# Patient Record
Sex: Male | Born: 1950 | Race: White | Hispanic: No | Marital: Married | Smoking: Never smoker
Health system: Southern US, Community
[De-identification: ages and names within clinical notes are randomized; demographics above are authoritative.]

## PROBLEM LIST (undated history)

## (undated) DIAGNOSIS — I82409 Acute embolism and thrombosis of unspecified deep veins of unspecified lower extremity: Secondary | ICD-10-CM

## (undated) DIAGNOSIS — C711 Malignant neoplasm of frontal lobe: Secondary | ICD-10-CM

## (undated) HISTORY — PX: BRAIN SURGERY: SHX531

## (undated) HISTORY — DX: Malignant neoplasm of frontal lobe: C71.1

## (undated) HISTORY — DX: Acute embolism and thrombosis of unspecified deep veins of unspecified lower extremity: I82.409

## (undated) HISTORY — PX: VASECTOMY: SHX75

---

## 2010-05-20 ENCOUNTER — Inpatient Hospital Stay (HOSPITAL_COMMUNITY): Admission: EM | Admit: 2010-05-20 | Discharge: 2010-05-22 | Payer: Self-pay | Admitting: Neurosurgery

## 2010-05-28 ENCOUNTER — Encounter (INDEPENDENT_AMBULATORY_CARE_PROVIDER_SITE_OTHER): Payer: Self-pay | Admitting: Neurosurgery

## 2010-05-28 ENCOUNTER — Inpatient Hospital Stay (HOSPITAL_COMMUNITY): Admission: RE | Admit: 2010-05-28 | Discharge: 2010-05-30 | Payer: Self-pay | Admitting: Neurosurgery

## 2010-05-30 ENCOUNTER — Ambulatory Visit: Payer: Self-pay | Admitting: Oncology

## 2010-06-01 ENCOUNTER — Ambulatory Visit
Admission: RE | Admit: 2010-06-01 | Discharge: 2010-07-23 | Payer: Self-pay | Source: Home / Self Care | Attending: Radiation Oncology | Admitting: Radiation Oncology

## 2010-06-04 LAB — CBC WITH DIFFERENTIAL/PLATELET
BASO%: 0.1 % (ref 0.0–2.0)
Basophils Absolute: 0 10*3/uL (ref 0.0–0.1)
EOS%: 0.1 % (ref 0.0–7.0)
Eosinophils Absolute: 0 10*3/uL (ref 0.0–0.5)
HCT: 44.3 % (ref 38.4–49.9)
HGB: 15.4 g/dL (ref 13.0–17.1)
LYMPH%: 8.5 % — ABNORMAL LOW (ref 14.0–49.0)
MCH: 32 pg (ref 27.2–33.4)
MCHC: 34.8 g/dL (ref 32.0–36.0)
MCV: 92.1 fL (ref 79.3–98.0)
MONO#: 0.7 10*3/uL (ref 0.1–0.9)
MONO%: 5.2 % (ref 0.0–14.0)
NEUT#: 12.1 10*3/uL — ABNORMAL HIGH (ref 1.5–6.5)
NEUT%: 86.1 % — ABNORMAL HIGH (ref 39.0–75.0)
Platelets: 212 10*3/uL (ref 140–400)
RBC: 4.81 10*6/uL (ref 4.20–5.82)
RDW: 12.4 % (ref 11.0–14.6)
WBC: 14.1 10*3/uL — ABNORMAL HIGH (ref 4.0–10.3)
lymph#: 1.2 10*3/uL (ref 0.9–3.3)

## 2010-06-04 LAB — COMPREHENSIVE METABOLIC PANEL
ALT: 75 U/L — ABNORMAL HIGH (ref 0–53)
AST: 45 U/L — ABNORMAL HIGH (ref 0–37)
Albumin: 3.9 g/dL (ref 3.5–5.2)
Alkaline Phosphatase: 91 U/L (ref 39–117)
BUN: 22 mg/dL (ref 6–23)
CO2: 31 mEq/L (ref 19–32)
Calcium: 8.9 mg/dL (ref 8.4–10.5)
Chloride: 98 mEq/L (ref 96–112)
Creatinine, Ser: 1.15 mg/dL (ref 0.40–1.50)
Glucose, Bld: 90 mg/dL (ref 70–99)
Potassium: 4.8 mEq/L (ref 3.5–5.3)
Sodium: 137 mEq/L (ref 135–145)
Total Bilirubin: 0.4 mg/dL (ref 0.3–1.2)
Total Protein: 6.2 g/dL (ref 6.0–8.3)

## 2010-07-02 ENCOUNTER — Ambulatory Visit: Payer: Self-pay | Admitting: Oncology

## 2010-07-04 LAB — CBC WITH DIFFERENTIAL/PLATELET
Basophils Absolute: 0 10*3/uL (ref 0.0–0.1)
EOS%: 0.1 % (ref 0.0–7.0)
HCT: 43.4 % (ref 38.4–49.9)
HGB: 15 g/dL (ref 13.0–17.1)
MCH: 32.5 pg (ref 27.2–33.4)
NEUT%: 86.1 % — ABNORMAL HIGH (ref 39.0–75.0)
Platelets: 260 10*3/uL (ref 140–400)
lymph#: 0.8 10*3/uL — ABNORMAL LOW (ref 0.9–3.3)

## 2010-07-04 LAB — COMPREHENSIVE METABOLIC PANEL
AST: 28 U/L (ref 0–37)
BUN: 21 mg/dL (ref 6–23)
CO2: 28 mEq/L (ref 19–32)
Calcium: 9.3 mg/dL (ref 8.4–10.5)
Chloride: 103 mEq/L (ref 96–112)
Creatinine, Ser: 1.12 mg/dL (ref 0.40–1.50)

## 2010-08-08 ENCOUNTER — Ambulatory Visit: Payer: Self-pay | Admitting: Oncology

## 2010-08-09 LAB — CBC WITH DIFFERENTIAL/PLATELET
BASO%: 0.5 % (ref 0.0–2.0)
Basophils Absolute: 0 10*3/uL (ref 0.0–0.1)
EOS%: 0.9 % (ref 0.0–7.0)
Eosinophils Absolute: 0.1 10*3/uL (ref 0.0–0.5)
HCT: 43.5 % (ref 38.4–49.9)
HGB: 14.8 g/dL (ref 13.0–17.1)
LYMPH%: 10.3 % — ABNORMAL LOW (ref 14.0–49.0)
MCH: 32.3 pg (ref 27.2–33.4)
MCHC: 34 g/dL (ref 32.0–36.0)
MCV: 95.1 fL (ref 79.3–98.0)
MONO#: 0.4 10*3/uL (ref 0.1–0.9)
MONO%: 6.6 % (ref 0.0–14.0)
NEUT#: 4.6 10*3/uL (ref 1.5–6.5)
NEUT%: 81.7 % — ABNORMAL HIGH (ref 39.0–75.0)
Platelets: 206 10*3/uL (ref 140–400)
RBC: 4.58 10*6/uL (ref 4.20–5.82)
RDW: 14.2 % (ref 11.0–14.6)
WBC: 5.6 10*3/uL (ref 4.0–10.3)
lymph#: 0.6 10*3/uL — ABNORMAL LOW (ref 0.9–3.3)

## 2010-08-09 LAB — COMPREHENSIVE METABOLIC PANEL
ALT: 68 U/L — ABNORMAL HIGH (ref 0–53)
AST: 36 U/L (ref 0–37)
Albumin: 4.5 g/dL (ref 3.5–5.2)
Alkaline Phosphatase: 123 U/L — ABNORMAL HIGH (ref 39–117)
BUN: 13 mg/dL (ref 6–23)
CO2: 27 mEq/L (ref 19–32)
Calcium: 9.6 mg/dL (ref 8.4–10.5)
Chloride: 104 mEq/L (ref 96–112)
Creatinine, Ser: 1.05 mg/dL (ref 0.40–1.50)
Glucose, Bld: 103 mg/dL — ABNORMAL HIGH (ref 70–99)
Potassium: 5 mEq/L (ref 3.5–5.3)
Sodium: 142 mEq/L (ref 135–145)
Total Bilirubin: 0.6 mg/dL (ref 0.3–1.2)
Total Protein: 7.1 g/dL (ref 6.0–8.3)

## 2010-08-20 ENCOUNTER — Ambulatory Visit (HOSPITAL_COMMUNITY)
Admission: RE | Admit: 2010-08-20 | Discharge: 2010-08-20 | Payer: Self-pay | Source: Home / Self Care | Attending: Oncology | Admitting: Oncology

## 2010-08-29 LAB — COMPREHENSIVE METABOLIC PANEL
ALT: 25 U/L (ref 0–53)
AST: 23 U/L (ref 0–37)
Albumin: 4.2 g/dL (ref 3.5–5.2)
Alkaline Phosphatase: 87 U/L (ref 39–117)
BUN: 13 mg/dL (ref 6–23)
CO2: 28 mEq/L (ref 19–32)
Calcium: 9.3 mg/dL (ref 8.4–10.5)
Chloride: 103 mEq/L (ref 96–112)
Creatinine, Ser: 1.08 mg/dL (ref 0.40–1.50)
Glucose, Bld: 94 mg/dL (ref 70–99)
Potassium: 4.1 mEq/L (ref 3.5–5.3)
Sodium: 142 mEq/L (ref 135–145)
Total Bilirubin: 0.8 mg/dL (ref 0.3–1.2)
Total Protein: 6.4 g/dL (ref 6.0–8.3)

## 2010-08-29 LAB — CBC WITH DIFFERENTIAL/PLATELET
BASO%: 0.9 % (ref 0.0–2.0)
Basophils Absolute: 0 10*3/uL (ref 0.0–0.1)
EOS%: 2.4 % (ref 0.0–7.0)
Eosinophils Absolute: 0.1 10*3/uL (ref 0.0–0.5)
HCT: 42.1 % (ref 38.4–49.9)
HGB: 14.5 g/dL (ref 13.0–17.1)
LYMPH%: 21 % (ref 14.0–49.0)
MCH: 32.4 pg (ref 27.2–33.4)
MCHC: 34.5 g/dL (ref 32.0–36.0)
MCV: 94.1 fL (ref 79.3–98.0)
MONO#: 0.4 10*3/uL (ref 0.1–0.9)
MONO%: 9.1 % (ref 0.0–14.0)
NEUT#: 3.3 10*3/uL (ref 1.5–6.5)
NEUT%: 66.6 % (ref 39.0–75.0)
Platelets: 198 10*3/uL (ref 140–400)
RBC: 4.47 10*6/uL (ref 4.20–5.82)
RDW: 14 % (ref 11.0–14.6)
WBC: 4.9 10*3/uL (ref 4.0–10.3)
lymph#: 1 10*3/uL (ref 0.9–3.3)

## 2010-09-03 LAB — UA PROTEIN, DIPSTICK - CHCC: Protein, Urine: NEGATIVE mg/dL

## 2010-09-10 ENCOUNTER — Other Ambulatory Visit: Payer: Self-pay | Admitting: Oncology

## 2010-09-10 ENCOUNTER — Encounter (HOSPITAL_BASED_OUTPATIENT_CLINIC_OR_DEPARTMENT_OTHER): Payer: 59 | Admitting: Oncology

## 2010-09-10 DIAGNOSIS — C711 Malignant neoplasm of frontal lobe: Secondary | ICD-10-CM

## 2010-09-10 DIAGNOSIS — Z5111 Encounter for antineoplastic chemotherapy: Secondary | ICD-10-CM

## 2010-09-10 LAB — CBC WITH DIFFERENTIAL/PLATELET
BASO%: 0.3 % (ref 0.0–2.0)
EOS%: 1.8 % (ref 0.0–7.0)
HGB: 14.5 g/dL (ref 13.0–17.1)
MCV: 94.2 fL (ref 79.3–98.0)
MONO#: 0.1 10*3/uL (ref 0.1–0.9)
NEUT#: 4 10*3/uL (ref 1.5–6.5)
Platelets: 193 10*3/uL (ref 140–400)
RDW: 13.3 % (ref 11.0–14.6)
lymph#: 0.6 10*3/uL — ABNORMAL LOW (ref 0.9–3.3)

## 2010-09-10 LAB — COMPREHENSIVE METABOLIC PANEL
Alkaline Phosphatase: 64 U/L (ref 39–117)
BUN: 17 mg/dL (ref 6–23)
Glucose, Bld: 124 mg/dL — ABNORMAL HIGH (ref 70–99)
Sodium: 138 mEq/L (ref 135–145)
Total Bilirubin: 0.4 mg/dL (ref 0.3–1.2)
Total Protein: 6.5 g/dL (ref 6.0–8.3)

## 2010-09-17 ENCOUNTER — Other Ambulatory Visit: Payer: Self-pay | Admitting: Oncology

## 2010-09-17 ENCOUNTER — Encounter (HOSPITAL_BASED_OUTPATIENT_CLINIC_OR_DEPARTMENT_OTHER): Payer: 59 | Admitting: Oncology

## 2010-09-17 DIAGNOSIS — Z5112 Encounter for antineoplastic immunotherapy: Secondary | ICD-10-CM

## 2010-09-17 DIAGNOSIS — E860 Dehydration: Secondary | ICD-10-CM

## 2010-09-17 DIAGNOSIS — C711 Malignant neoplasm of frontal lobe: Secondary | ICD-10-CM

## 2010-09-17 DIAGNOSIS — D709 Neutropenia, unspecified: Secondary | ICD-10-CM

## 2010-09-17 LAB — COMPREHENSIVE METABOLIC PANEL
AST: 13 U/L (ref 0–37)
Albumin: 4 g/dL (ref 3.5–5.2)
Alkaline Phosphatase: 83 U/L (ref 39–117)
BUN: 16 mg/dL (ref 6–23)
Calcium: 9.1 mg/dL (ref 8.4–10.5)
Chloride: 94 mEq/L — ABNORMAL LOW (ref 96–112)
Glucose, Bld: 123 mg/dL — ABNORMAL HIGH (ref 70–99)
Potassium: 4.2 mEq/L (ref 3.5–5.3)
Sodium: 131 mEq/L — ABNORMAL LOW (ref 135–145)
Total Protein: 6.5 g/dL (ref 6.0–8.3)

## 2010-09-17 LAB — CBC WITH DIFFERENTIAL/PLATELET
Basophils Absolute: 0 10*3/uL (ref 0.0–0.1)
EOS%: 1.7 % (ref 0.0–7.0)
HCT: 41 % (ref 38.4–49.9)
HGB: 14.8 g/dL (ref 13.0–17.1)
MCH: 32 pg (ref 27.2–33.4)
MONO#: 0.6 10*3/uL (ref 0.1–0.9)
NEUT#: 1.1 10*3/uL — ABNORMAL LOW (ref 1.5–6.5)
NEUT%: 47.3 % (ref 39.0–75.0)
RDW: 12.2 % (ref 11.0–14.6)
WBC: 2.4 10*3/uL — ABNORMAL LOW (ref 4.0–10.3)
lymph#: 0.6 10*3/uL — ABNORMAL LOW (ref 0.9–3.3)

## 2010-09-19 ENCOUNTER — Other Ambulatory Visit: Payer: Self-pay | Admitting: Oncology

## 2010-09-19 ENCOUNTER — Encounter (HOSPITAL_BASED_OUTPATIENT_CLINIC_OR_DEPARTMENT_OTHER): Payer: 59 | Admitting: Oncology

## 2010-09-19 DIAGNOSIS — C711 Malignant neoplasm of frontal lobe: Secondary | ICD-10-CM

## 2010-09-19 DIAGNOSIS — R197 Diarrhea, unspecified: Secondary | ICD-10-CM

## 2010-09-19 LAB — BASIC METABOLIC PANEL
Chloride: 97 mEq/L (ref 96–112)
Potassium: 4.3 mEq/L (ref 3.5–5.3)
Sodium: 135 mEq/L (ref 135–145)

## 2010-09-19 LAB — CBC WITH DIFFERENTIAL/PLATELET
EOS%: 0.5 % (ref 0.0–7.0)
MCH: 32.7 pg (ref 27.2–33.4)
MCV: 93.9 fL (ref 79.3–98.0)
MONO%: 11.6 % (ref 0.0–14.0)
NEUT#: 2.2 10*3/uL (ref 1.5–6.5)
RBC: 3.98 10*6/uL — ABNORMAL LOW (ref 4.20–5.82)
RDW: 12.9 % (ref 11.0–14.6)
lymph#: 0.4 10*3/uL — ABNORMAL LOW (ref 0.9–3.3)

## 2010-09-20 ENCOUNTER — Encounter (HOSPITAL_BASED_OUTPATIENT_CLINIC_OR_DEPARTMENT_OTHER): Payer: 59 | Admitting: Oncology

## 2010-09-20 DIAGNOSIS — C711 Malignant neoplasm of frontal lobe: Secondary | ICD-10-CM

## 2010-09-20 DIAGNOSIS — R197 Diarrhea, unspecified: Secondary | ICD-10-CM

## 2010-09-24 ENCOUNTER — Other Ambulatory Visit: Payer: Self-pay | Admitting: Oncology

## 2010-09-24 ENCOUNTER — Encounter (HOSPITAL_BASED_OUTPATIENT_CLINIC_OR_DEPARTMENT_OTHER): Payer: 59 | Admitting: Oncology

## 2010-09-24 DIAGNOSIS — C711 Malignant neoplasm of frontal lobe: Secondary | ICD-10-CM

## 2010-09-24 DIAGNOSIS — D709 Neutropenia, unspecified: Secondary | ICD-10-CM

## 2010-09-24 DIAGNOSIS — E860 Dehydration: Secondary | ICD-10-CM

## 2010-09-24 LAB — CBC WITH DIFFERENTIAL/PLATELET
Basophils Absolute: 0 10*3/uL (ref 0.0–0.1)
Eosinophils Absolute: 0.1 10*3/uL (ref 0.0–0.5)
HCT: 36.9 % — ABNORMAL LOW (ref 38.4–49.9)
HGB: 12.8 g/dL — ABNORMAL LOW (ref 13.0–17.1)
LYMPH%: 12 % — ABNORMAL LOW (ref 14.0–49.0)
MONO#: 0.8 10*3/uL (ref 0.1–0.9)
NEUT#: 5.5 10*3/uL (ref 1.5–6.5)
NEUT%: 76.1 % — ABNORMAL HIGH (ref 39.0–75.0)
Platelets: 220 10*3/uL (ref 140–400)
WBC: 7.2 10*3/uL (ref 4.0–10.3)
nRBC: 0 % (ref 0–0)

## 2010-10-01 ENCOUNTER — Other Ambulatory Visit: Payer: Self-pay | Admitting: Oncology

## 2010-10-01 ENCOUNTER — Encounter (HOSPITAL_BASED_OUTPATIENT_CLINIC_OR_DEPARTMENT_OTHER): Payer: 59 | Admitting: Oncology

## 2010-10-01 DIAGNOSIS — C711 Malignant neoplasm of frontal lobe: Secondary | ICD-10-CM

## 2010-10-01 DIAGNOSIS — Z5112 Encounter for antineoplastic immunotherapy: Secondary | ICD-10-CM

## 2010-10-01 LAB — COMPREHENSIVE METABOLIC PANEL
Albumin: 3.4 g/dL — ABNORMAL LOW (ref 3.5–5.2)
BUN: 13 mg/dL (ref 6–23)
Calcium: 8.9 mg/dL (ref 8.4–10.5)
Chloride: 100 mEq/L (ref 96–112)
Glucose, Bld: 106 mg/dL — ABNORMAL HIGH (ref 70–99)
Potassium: 4.3 mEq/L (ref 3.5–5.3)

## 2010-10-01 LAB — CBC WITH DIFFERENTIAL/PLATELET
Basophils Absolute: 0 10*3/uL (ref 0.0–0.1)
HCT: 38.4 % (ref 38.4–49.9)
HGB: 13.1 g/dL (ref 13.0–17.1)
MONO#: 0.5 10*3/uL (ref 0.1–0.9)
NEUT%: 80.1 % — ABNORMAL HIGH (ref 39.0–75.0)
WBC: 8.5 10*3/uL (ref 4.0–10.3)
lymph#: 1.1 10*3/uL (ref 0.9–3.3)

## 2010-10-02 ENCOUNTER — Ambulatory Visit (HOSPITAL_COMMUNITY)
Admission: RE | Admit: 2010-10-02 | Discharge: 2010-10-02 | Disposition: A | Discharge status: Home / Self Care | Payer: 59 | Source: Ambulatory Visit | Attending: Oncology | Admitting: Oncology

## 2010-10-02 ENCOUNTER — Encounter: Payer: 59 | Admitting: Oncology

## 2010-10-02 ENCOUNTER — Inpatient Hospital Stay (HOSPITAL_COMMUNITY): Payer: 59

## 2010-10-02 ENCOUNTER — Inpatient Hospital Stay (HOSPITAL_COMMUNITY)
Admission: AD | Admit: 2010-10-02 | Discharge: 2010-10-05 | DRG: 299 | Disposition: A | Discharge status: Home / Self Care | Payer: 59 | Source: Ambulatory Visit | Attending: Oncology | Admitting: Oncology

## 2010-10-02 DIAGNOSIS — C719 Malignant neoplasm of brain, unspecified: Secondary | ICD-10-CM

## 2010-10-02 DIAGNOSIS — I824Y9 Acute embolism and thrombosis of unspecified deep veins of unspecified proximal lower extremity: Principal | ICD-10-CM | POA: Diagnosis present

## 2010-10-02 DIAGNOSIS — I82409 Acute embolism and thrombosis of unspecified deep veins of unspecified lower extremity: Secondary | ICD-10-CM

## 2010-10-02 DIAGNOSIS — I2699 Other pulmonary embolism without acute cor pulmonale: Secondary | ICD-10-CM | POA: Diagnosis present

## 2010-10-02 DIAGNOSIS — C711 Malignant neoplasm of frontal lobe: Secondary | ICD-10-CM | POA: Diagnosis present

## 2010-10-02 DIAGNOSIS — M7989 Other specified soft tissue disorders: Secondary | ICD-10-CM

## 2010-10-02 LAB — COMPREHENSIVE METABOLIC PANEL
ALT: 50 U/L (ref 0–53)
Calcium: 8.4 mg/dL (ref 8.4–10.5)
Creatinine, Ser: 0.81 mg/dL (ref 0.4–1.5)
Glucose, Bld: 104 mg/dL — ABNORMAL HIGH (ref 70–99)
Sodium: 138 mEq/L (ref 135–145)
Total Protein: 5.9 g/dL — ABNORMAL LOW (ref 6.0–8.3)

## 2010-10-02 LAB — CBC
Hemoglobin: 11.8 g/dL — ABNORMAL LOW (ref 13.0–17.0)
MCHC: 33.5 g/dL (ref 30.0–36.0)
RBC: 3.78 MIL/uL — ABNORMAL LOW (ref 4.22–5.81)

## 2010-10-02 LAB — PROTIME-INR
INR: 1.01 (ref 0.00–1.49)
Prothrombin Time: 13.5 seconds (ref 11.6–15.2)

## 2010-10-02 MED ORDER — IOHEXOL 300 MG/ML  SOLN
100.0000 mL | Freq: Once | INTRAMUSCULAR | Status: AC | PRN
  Administered 2010-10-02: 100 mL via INTRAVENOUS

## 2010-10-03 LAB — HEPARIN LEVEL (UNFRACTIONATED)
Heparin Unfractionated: 0.46 IU/mL (ref 0.30–0.70)
Heparin Unfractionated: 0.68 IU/mL (ref 0.30–0.70)
Heparin Unfractionated: 0.54 IU/mL (ref 0.30–0.70)

## 2010-10-04 DIAGNOSIS — C719 Malignant neoplasm of brain, unspecified: Secondary | ICD-10-CM

## 2010-10-04 DIAGNOSIS — M79609 Pain in unspecified limb: Secondary | ICD-10-CM

## 2010-10-04 DIAGNOSIS — I2699 Other pulmonary embolism without acute cor pulmonale: Secondary | ICD-10-CM

## 2010-10-04 DIAGNOSIS — I82409 Acute embolism and thrombosis of unspecified deep veins of unspecified lower extremity: Secondary | ICD-10-CM

## 2010-10-04 LAB — CBC
MCV: 93.7 fL (ref 78.0–100.0)
Platelets: 321 10*3/uL (ref 150–400)
RBC: 3.79 MIL/uL — ABNORMAL LOW (ref 4.22–5.81)
WBC: 5.7 10*3/uL (ref 4.0–10.5)

## 2010-10-04 LAB — HEPARIN LEVEL (UNFRACTIONATED): Heparin Unfractionated: 0.52 IU/mL (ref 0.30–0.70)

## 2010-10-04 LAB — BASIC METABOLIC PANEL
Calcium: 8.7 mg/dL (ref 8.4–10.5)
GFR calc Af Amer: >60 mL/min (ref >60)
GFR calc non Af Amer: >60 mL/min (ref >60)
Potassium: 3.9 mEq/L (ref 3.5–5.1)
Sodium: 140 mEq/L (ref 135–145)

## 2010-10-05 LAB — CBC
HCT: 35.2 % — ABNORMAL LOW (ref 39.0–52.0)
Hemoglobin: 11.7 g/dL — ABNORMAL LOW (ref 13.0–17.0)
RBC: 3.74 MIL/uL — ABNORMAL LOW (ref 4.22–5.81)
WBC: 5.2 10*3/uL (ref 4.0–10.5)

## 2010-10-05 LAB — HEPARIN LEVEL (UNFRACTIONATED): Heparin Unfractionated: 0.3 IU/mL (ref 0.30–0.70)

## 2010-10-08 ENCOUNTER — Encounter (HOSPITAL_BASED_OUTPATIENT_CLINIC_OR_DEPARTMENT_OTHER): Payer: 59 | Admitting: Oncology

## 2010-10-08 ENCOUNTER — Other Ambulatory Visit: Payer: Self-pay | Admitting: Oncology

## 2010-10-08 DIAGNOSIS — Z5111 Encounter for antineoplastic chemotherapy: Secondary | ICD-10-CM

## 2010-10-08 DIAGNOSIS — E860 Dehydration: Secondary | ICD-10-CM

## 2010-10-08 DIAGNOSIS — I824Z9 Acute embolism and thrombosis of unspecified deep veins of unspecified distal lower extremity: Secondary | ICD-10-CM

## 2010-10-08 DIAGNOSIS — D709 Neutropenia, unspecified: Secondary | ICD-10-CM

## 2010-10-08 DIAGNOSIS — C711 Malignant neoplasm of frontal lobe: Secondary | ICD-10-CM

## 2010-10-08 LAB — CBC WITH DIFFERENTIAL/PLATELET
BASO%: 1.7 % (ref 0.0–2.0)
Eosinophils Absolute: 0.1 10*3/uL (ref 0.0–0.5)
LYMPH%: 28.1 % (ref 14.0–49.0)
MCHC: 33.6 g/dL (ref 32.0–36.0)
MONO#: 0.4 10*3/uL (ref 0.1–0.9)
MONO%: 8 % (ref 0.0–14.0)
NEUT#: 2.8 10*3/uL (ref 1.5–6.5)
RBC: 4.05 10*6/uL — ABNORMAL LOW (ref 4.20–5.82)
RDW: 14.1 % (ref 11.0–14.6)
WBC: 4.7 10*3/uL (ref 4.0–10.3)

## 2010-10-08 LAB — PROTIME-INR
INR: 1.3 — ABNORMAL LOW (ref 2.00–3.50)
Protime: 15.6 Seconds — ABNORMAL HIGH (ref 10.6–13.4)

## 2010-10-08 LAB — HEPARIN ANTI-XA: Heparin LMW: 1.1 IU/mL

## 2010-10-15 ENCOUNTER — Other Ambulatory Visit: Payer: Self-pay | Admitting: Oncology

## 2010-10-15 ENCOUNTER — Encounter (HOSPITAL_BASED_OUTPATIENT_CLINIC_OR_DEPARTMENT_OTHER): Payer: 59 | Admitting: Oncology

## 2010-10-15 DIAGNOSIS — E860 Dehydration: Secondary | ICD-10-CM

## 2010-10-15 DIAGNOSIS — Z7901 Long term (current) use of anticoagulants: Secondary | ICD-10-CM

## 2010-10-15 DIAGNOSIS — I82409 Acute embolism and thrombosis of unspecified deep veins of unspecified lower extremity: Secondary | ICD-10-CM

## 2010-10-15 DIAGNOSIS — Z5112 Encounter for antineoplastic immunotherapy: Secondary | ICD-10-CM

## 2010-10-15 DIAGNOSIS — C711 Malignant neoplasm of frontal lobe: Secondary | ICD-10-CM

## 2010-10-15 DIAGNOSIS — D709 Neutropenia, unspecified: Secondary | ICD-10-CM

## 2010-10-15 DIAGNOSIS — Z5181 Encounter for therapeutic drug level monitoring: Secondary | ICD-10-CM

## 2010-10-15 LAB — CBC WITH DIFFERENTIAL/PLATELET
BASO%: 0.4 % (ref 0.0–2.0)
EOS%: 4.9 % (ref 0.0–7.0)
HCT: 39.9 % (ref 38.4–49.9)
LYMPH%: 21.8 % (ref 14.0–49.0)
MCH: 31.8 pg (ref 27.2–33.4)
MCHC: 34.8 g/dL (ref 32.0–36.0)
MCV: 91.3 fL (ref 79.3–98.0)
MONO#: 0.4 10*3/uL (ref 0.1–0.9)
MONO%: 9.1 % (ref 0.0–14.0)
NEUT%: 63.8 % (ref 39.0–75.0)
Platelets: 261 10*3/uL (ref 140–400)
RBC: 4.37 10*6/uL (ref 4.20–5.82)
nRBC: 0 % (ref 0–0)

## 2010-10-15 LAB — COMPREHENSIVE METABOLIC PANEL
AST: 59 U/L — ABNORMAL HIGH (ref 0–37)
Albumin: 3.7 g/dL (ref 3.5–5.2)
BUN: 15 mg/dL (ref 6–23)
CO2: 26 mEq/L (ref 19–32)
Calcium: 8.6 mg/dL (ref 8.4–10.5)
Chloride: 98 mEq/L (ref 96–112)
Creatinine, Ser: 0.98 mg/dL (ref 0.40–1.50)
Potassium: 3.9 mEq/L (ref 3.5–5.3)

## 2010-10-15 LAB — PROTIME-INR

## 2010-10-17 LAB — BASIC METABOLIC PANEL
BUN: 13 mg/dL (ref 6–23)
BUN: 16 mg/dL (ref 6–23)
BUN: 17 mg/dL (ref 6–23)
CO2: 24 mEq/L (ref 19–32)
CO2: 28 mEq/L (ref 19–32)
CO2: 28 mEq/L (ref 19–32)
Calcium: 8 mg/dL — ABNORMAL LOW (ref 8.4–10.5)
Calcium: 9.3 mg/dL (ref 8.4–10.5)
Calcium: 9.4 mg/dL (ref 8.4–10.5)
Chloride: 104 mEq/L (ref 96–112)
Chloride: 106 mEq/L (ref 96–112)
Chloride: 108 mEq/L (ref 96–112)
Creatinine, Ser: 0.95 mg/dL (ref 0.4–1.5)
Creatinine, Ser: 1.07 mg/dL (ref 0.4–1.5)
Creatinine, Ser: 1.09 mg/dL (ref 0.4–1.5)
GFR calc Af Amer: >60 mL/min (ref >60)
GFR calc Af Amer: >60 mL/min (ref >60)
GFR calc Af Amer: >60 mL/min (ref >60)
GFR calc non Af Amer: >60 mL/min (ref >60)
GFR calc non Af Amer: >60 mL/min (ref >60)
GFR calc non Af Amer: >60 mL/min (ref >60)
Glucose, Bld: 104 mg/dL — ABNORMAL HIGH (ref 70–99)
Glucose, Bld: 137 mg/dL — ABNORMAL HIGH (ref 70–99)
Glucose, Bld: 144 mg/dL — ABNORMAL HIGH (ref 70–99)
Potassium: 4.1 mEq/L (ref 3.5–5.1)
Potassium: 4.2 mEq/L (ref 3.5–5.1)
Potassium: 4.7 mEq/L (ref 3.5–5.1)
Sodium: 138 mEq/L (ref 135–145)
Sodium: 138 mEq/L (ref 135–145)
Sodium: 141 mEq/L (ref 135–145)

## 2010-10-17 LAB — TYPE AND SCREEN
ABO/RH(D): B POS
Antibody Screen: NEGATIVE
Antibody Screen: NEGATIVE

## 2010-10-17 LAB — CBC
HCT: 45.8 % (ref 39.0–52.0)
Hemoglobin: 16.2 g/dL (ref 13.0–17.0)
MCH: 32.3 pg (ref 26.0–34.0)
MCHC: 35.4 g/dL (ref 30.0–36.0)
MCV: 91.4 fL (ref 78.0–100.0)
Platelets: 275 10*3/uL (ref 150–400)
RBC: 5.01 MIL/uL (ref 4.22–5.81)
RDW: 12.2 % (ref 11.5–15.5)
WBC: 10.5 10*3/uL (ref 4.0–10.5)

## 2010-10-17 LAB — SURGICAL PCR SCREEN
MRSA, PCR: NEGATIVE
Staphylococcus aureus: NEGATIVE

## 2010-10-17 LAB — ABO/RH: ABO/RH(D): B POS

## 2010-10-17 LAB — MRSA PCR SCREENING: MRSA by PCR: NEGATIVE

## 2010-10-25 NOTE — H&P (Signed)
NAMEBABACAR, HAYCRAFT               ACCOUNT NO.:  [id number]  MEDICAL RECORD NO.:  [id number]           PATIENT TYPE:  O  LOCATION:  VASC                         FACILITY:  [facility]  PHYSICIAN:  Marcy Panning, MD       DATE OF BIRTH:  1950/12/21  DATE OF ADMISSION:  10/02/2010 DATE OF DISCHARGE:                             HISTORY & PHYSICAL   REASON FOR ADMISSION:  This 60 year old gentleman with grade 4 glioblastoma multiforme, now with left lower extremity extensive deep venous thrombosis.  HISTORY OF PRESENT ILLNESS:  The patient is a very pleasant 60 year old gentleman who in mid October 2011 presented with left facial and left upper extremity focal seizures.  CT of the head revealed a right posterior frontal brain tumor.  On May 28, 2010, patient was transferred to [facility], he underwent an MRI that showed the lesion in the posterior frontal brain.  He thereafter underwent craniotomy and resection of the tumor.  The final pathology revealed a WHO grade 4 glioblastoma multiforme.  He was then seen by me on June 04, 2010 and was also seen by Dr. Gery Pray for consideration of postop treatment. He was treated with whole brain radiation therapy in the [facility] and he received concurrent Temodar at 75 mg/m sq.  His treatments were administered between June 18, 2010 to August 02, 2010.  He tolerated this well.  Thereafter, he had MRI and there was a suggestion of progression of disease.  Therefore, I was opted to start him on Avastin and irinotecan, starting on August 29, 2010.  He received 2 weeks' worth of irinotecan and developed severe side effects, consisting of diarrhea, failure to thrive, requiring discontinuation of irinotecan. He then was started on Avastin, his last dose given was on October 01, 2010.  When he was seen on October 01, 2010, he complained of pulling sensation in his left leg.  Because of this, we requested a Doppler study to rule out a DVT.   Unfortunately, the Doppler study did reveal an extensive DVT in the femoral and popliteal veins.  The patient is therefore being admitted to Medical Oncology Inpatient Service for anticoagulation with unfractionated heparin and eventual conversion to oral Coumadin.  On further questioning, patient does complain of weakness, fatigue, and little bit of shortness of breath.  He has no abdominal pain.  He has no nausea, no vomiting.  No fevers, chills, or night sweats.  His weight has remained stable, although overall he has lost significant weight due to his bouts with diarrhea from his irinotecan.  The patient does note left lower extremity swelling, mainly in the ankles but when we examined him, his left lower extremity is bigger than his right lower extremity.  PAST MEDICAL HISTORY:  Otherwise negative.  PAST SURGICAL HISTORY: 1. Status post right frontotemporal growth, total resection of the     tumor on May 28, 2010 with Gliadel wafers. 2. Status post vasectomy in 1980.  ALLERGIES:  No known drug allergies.  CURRENT MEDICATIONS:  Include, 1. Tylenol as needed. 2. Decadron 0.75 mg daily. 3. Compazine 10 mg q.6h. p.r.n. 4. Zofran 8 mg  q.12h. p.r.n. 5. Thorazine 25 mg q.6h. p.r.n. as needed. 6. Keppra 500 mg b.i.d. 7. Pepcid 20 mg b.i.d. 8. Lomotil as needed.  FAMILY HISTORY:  Mother died at 47 with cervical cancer.  Father died at 31 with leukemia.  The patient has several siblings who are in good health.  SOCIAL HISTORY:  The patient is married.  He has 3 children who are in good health and 2 of them live in [place].  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  He is now retired.  He also was Data processing manager at [employer].  He does not smoke and occasionally drinks.  PHYSICAL EXAMINATION:  GENERAL:  The patient is awake, alert, in  no acute distress. VITAL SIGNS:  Temperature is 97.6, pulse 71, respirations 20, blood pressure 107/67, and weight is 157.7 pounds. HEENT EXAM:  Unremarkable. LUNGS:  Clear. CARDIOVASCULAR:  Regular rate and rhythm.  No murmurs. ABDOMEN:  Soft, nontender, nondistended.  Bowel sounds are present.  No HSM. EXTREMITIES:  +2 edema in the left lower extremity.  Right lower extremity, no edema. NEURO:  The patient is alert and oriented, otherwise unchanged.  LABORATORY DATA:  From October 01, 2010 reveals a white count 8.5, hemoglobin 13.1, hematocrit 38.4, platelets 327,000.  Electrolytes are normal.  Alk phos is 298, ALT is 59, albumin is 3.4.  IMPRESSION AND PLAN: 1. This 60 year old gentleman with WHO grade 4 glioblastoma     multiforme.  The patient is status post Avastin on October 01, 2010.  He now has developed left lower extremity DVT on a Doppler     that was performed earlier today.  The patient is being admitted to     medical oncology for anticoagulation with unfractionated heparin.     We will do protocol per pharmacy.  He will be converted to oral     Coumadin in about 48 hours.  I will also obtain CT of the chest,     abdomen, and pelvis to look at extensive clot. 2. The patient will continue his medications which would include     dexamethasone, famotidine, Keppra, Zofran, prochlorperazine and we     will also do oncology standing orders.  The patient is a full code     and hopefully, he can be discharged to home when his INR becomes     therapeutic.     Marcy Panning, MD     KK/MEDQ  D:  10/02/2010  T:  10/02/2010  Job:  [id number]  cc:   Hosie Spangle, M.D. Fax: [phone]  Blair Promise, M.D. Fax: [phone]  Electronically Signed by Marcy Panning MD on 10/25/2010 05:20:21 PM

## 2010-10-29 ENCOUNTER — Encounter (HOSPITAL_BASED_OUTPATIENT_CLINIC_OR_DEPARTMENT_OTHER): Payer: 59 | Admitting: Oncology

## 2010-10-29 ENCOUNTER — Other Ambulatory Visit: Payer: Self-pay | Admitting: Physician Assistant

## 2010-10-29 ENCOUNTER — Other Ambulatory Visit: Payer: Self-pay | Admitting: Oncology

## 2010-10-29 DIAGNOSIS — Z5112 Encounter for antineoplastic immunotherapy: Secondary | ICD-10-CM

## 2010-10-29 DIAGNOSIS — C711 Malignant neoplasm of frontal lobe: Secondary | ICD-10-CM

## 2010-10-29 DIAGNOSIS — D696 Thrombocytopenia, unspecified: Secondary | ICD-10-CM

## 2010-10-29 DIAGNOSIS — I82409 Acute embolism and thrombosis of unspecified deep veins of unspecified lower extremity: Secondary | ICD-10-CM

## 2010-10-29 LAB — COMPREHENSIVE METABOLIC PANEL
BUN: 14 mg/dL (ref 6–23)
CO2: 21 mEq/L (ref 19–32)
Creatinine, Ser: 0.83 mg/dL (ref 0.40–1.50)
Glucose, Bld: 111 mg/dL — ABNORMAL HIGH (ref 70–99)
Total Bilirubin: 0.4 mg/dL (ref 0.3–1.2)

## 2010-10-29 LAB — CBC WITH DIFFERENTIAL/PLATELET
BASO%: 0 % (ref 0.0–2.0)
LYMPH%: 24.4 % (ref 14.0–49.0)
MCHC: 34.2 g/dL (ref 32.0–36.0)
MONO#: 0.2 10*3/uL (ref 0.1–0.9)
MONO%: 4.4 % (ref 0.0–14.0)
Platelets: 72 10*3/uL — ABNORMAL LOW (ref 140–400)
RBC: 3.95 10*6/uL — ABNORMAL LOW (ref 4.20–5.82)
WBC: 4.3 10*3/uL (ref 4.0–10.3)
nRBC: 0 % (ref 0–0)

## 2010-10-29 LAB — PROTIME-INR

## 2010-10-31 LAB — HEPARIN INDUCED THROMBOCYTOPENIA PNL
Heparin Induced Plt Ab: NEGATIVE
Patient O.D.: 0.193
UFH Low Dose,0.1: 2 % Release
UFH Low Dose,0.5: 0 % Release
UFH Low Dose,100: 0 % Release
UFH SRA Result: NEGATIVE

## 2010-11-05 ENCOUNTER — Encounter (HOSPITAL_BASED_OUTPATIENT_CLINIC_OR_DEPARTMENT_OTHER): Payer: 59 | Admitting: Oncology

## 2010-11-05 ENCOUNTER — Other Ambulatory Visit: Payer: Self-pay | Admitting: Physician Assistant

## 2010-11-05 ENCOUNTER — Encounter (HOSPITAL_COMMUNITY)
Admission: RE | Admit: 2010-11-05 | Discharge: 2010-11-05 | Disposition: A | Discharge status: Home / Self Care | Payer: 59 | Source: Ambulatory Visit | Attending: Oncology | Admitting: Oncology

## 2010-11-05 DIAGNOSIS — D649 Anemia, unspecified: Secondary | ICD-10-CM | POA: Insufficient documentation

## 2010-11-05 DIAGNOSIS — C711 Malignant neoplasm of frontal lobe: Secondary | ICD-10-CM

## 2010-11-05 DIAGNOSIS — Z7901 Long term (current) use of anticoagulants: Secondary | ICD-10-CM

## 2010-11-05 DIAGNOSIS — I824Z9 Acute embolism and thrombosis of unspecified deep veins of unspecified distal lower extremity: Secondary | ICD-10-CM

## 2010-11-05 DIAGNOSIS — D696 Thrombocytopenia, unspecified: Secondary | ICD-10-CM

## 2010-11-05 LAB — ABO/RH: ABO/RH(D): B POS

## 2010-11-05 LAB — CBC WITH DIFFERENTIAL/PLATELET
BASO%: 0 % (ref 0.0–2.0)
Basophils Absolute: 0 10*3/uL (ref 0.0–0.1)
EOS%: 2.9 % (ref 0.0–7.0)
Eosinophils Absolute: 0.1 10*3/uL (ref 0.0–0.5)
HCT: 33.3 % — ABNORMAL LOW (ref 38.4–49.9)
HGB: 11.6 g/dL — ABNORMAL LOW (ref 13.0–17.1)
LYMPH%: 29.8 % (ref 14.0–49.0)
MCH: 31.5 pg (ref 27.2–33.4)
MCHC: 34.8 g/dL (ref 32.0–36.0)
MCV: 90.5 fL (ref 79.3–98.0)
MONO#: 0 10*3/uL — ABNORMAL LOW (ref 0.1–0.9)
MONO%: 1.1 % (ref 0.0–14.0)
NEUT#: 1.8 10*3/uL (ref 1.5–6.5)
NEUT%: 66.2 % (ref 39.0–75.0)
Platelets: 12 10*3/uL — ABNORMAL LOW (ref 140–400)
RBC: 3.68 10*6/uL — ABNORMAL LOW (ref 4.20–5.82)
RDW: 13.3 % (ref 11.0–14.6)
WBC: 2.8 10*3/uL — ABNORMAL LOW (ref 4.0–10.3)
lymph#: 0.8 10*3/uL — ABNORMAL LOW (ref 0.9–3.3)
nRBC: 0 % (ref 0–0)

## 2010-11-06 LAB — PREPARE PLATELETS: Unit division: 0

## 2010-11-07 LAB — ROCKY MTN SPOTTED FVR ABS PNL(IGG+IGM): RMSF IgM: 0.07 IV

## 2010-11-09 ENCOUNTER — Encounter (HOSPITAL_COMMUNITY): Payer: 59

## 2010-11-13 ENCOUNTER — Encounter (HOSPITAL_COMMUNITY): Payer: 59

## 2010-11-26 ENCOUNTER — Other Ambulatory Visit: Payer: Self-pay | Admitting: Oncology

## 2010-11-26 ENCOUNTER — Encounter (HOSPITAL_BASED_OUTPATIENT_CLINIC_OR_DEPARTMENT_OTHER): Payer: 59 | Admitting: Oncology

## 2010-11-26 DIAGNOSIS — C711 Malignant neoplasm of frontal lobe: Secondary | ICD-10-CM

## 2010-11-26 DIAGNOSIS — Z5111 Encounter for antineoplastic chemotherapy: Secondary | ICD-10-CM

## 2010-11-26 DIAGNOSIS — Z5112 Encounter for antineoplastic immunotherapy: Secondary | ICD-10-CM

## 2010-11-26 LAB — COMPREHENSIVE METABOLIC PANEL
ALT: 43 U/L (ref 0–53)
Albumin: 4.4 g/dL (ref 3.5–5.2)
CO2: 23 mEq/L (ref 19–32)
Chloride: 105 mEq/L (ref 96–112)
Glucose, Bld: 97 mg/dL (ref 70–99)
Potassium: 4.4 mEq/L (ref 3.5–5.3)
Sodium: 140 mEq/L (ref 135–145)
Total Protein: 6.3 g/dL (ref 6.0–8.3)

## 2010-11-26 LAB — CBC WITH DIFFERENTIAL/PLATELET
Basophils Absolute: 0 10*3/uL (ref 0.0–0.1)
Eosinophils Absolute: 0 10*3/uL (ref 0.0–0.5)
HCT: 36.8 % — ABNORMAL LOW (ref 38.4–49.9)
LYMPH%: 24.3 % (ref 14.0–49.0)
MCV: 96.3 fL (ref 79.3–98.0)
MONO#: 0.4 10*3/uL (ref 0.1–0.9)
MONO%: 10.5 % (ref 0.0–14.0)
NEUT#: 2.3 10*3/uL (ref 1.5–6.5)
NEUT%: 64.6 % (ref 39.0–75.0)
Platelets: 315 10*3/uL (ref 140–400)
RBC: 3.82 10*6/uL — ABNORMAL LOW (ref 4.20–5.82)
nRBC: 0 % (ref 0–0)

## 2010-11-26 LAB — PROTIME-INR

## 2010-12-10 ENCOUNTER — Other Ambulatory Visit: Payer: Self-pay | Admitting: Oncology

## 2010-12-10 ENCOUNTER — Encounter (HOSPITAL_BASED_OUTPATIENT_CLINIC_OR_DEPARTMENT_OTHER): Payer: 59 | Admitting: Oncology

## 2010-12-10 DIAGNOSIS — Z5112 Encounter for antineoplastic immunotherapy: Secondary | ICD-10-CM

## 2010-12-10 DIAGNOSIS — D696 Thrombocytopenia, unspecified: Secondary | ICD-10-CM

## 2010-12-10 DIAGNOSIS — I824Z9 Acute embolism and thrombosis of unspecified deep veins of unspecified distal lower extremity: Secondary | ICD-10-CM

## 2010-12-10 DIAGNOSIS — Z5111 Encounter for antineoplastic chemotherapy: Secondary | ICD-10-CM

## 2010-12-10 DIAGNOSIS — C711 Malignant neoplasm of frontal lobe: Secondary | ICD-10-CM

## 2010-12-10 LAB — CBC WITH DIFFERENTIAL/PLATELET
BASO%: 0.2 % (ref 0.0–2.0)
Eosinophils Absolute: 0.1 10*3/uL (ref 0.0–0.5)
LYMPH%: 19.2 % (ref 14.0–49.0)
MCHC: 33.5 g/dL (ref 32.0–36.0)
MONO#: 0.3 10*3/uL (ref 0.1–0.9)
MONO%: 6.8 % (ref 0.0–14.0)
NEUT#: 3.3 10*3/uL (ref 1.5–6.5)
Platelets: 210 10*3/uL (ref 140–400)
RBC: 4.09 10*6/uL — ABNORMAL LOW (ref 4.20–5.82)
RDW: 17.1 % — ABNORMAL HIGH (ref 11.0–14.6)
WBC: 4.5 10*3/uL (ref 4.0–10.3)
nRBC: 0 % (ref 0–0)

## 2010-12-10 LAB — COMPREHENSIVE METABOLIC PANEL
BUN: 12 mg/dL (ref 6–23)
CO2: 24 mEq/L (ref 19–32)
Calcium: 9.3 mg/dL (ref 8.4–10.5)
Chloride: 107 mEq/L (ref 96–112)
Creatinine, Ser: 0.89 mg/dL (ref 0.40–1.50)
Total Bilirubin: 0.4 mg/dL (ref 0.3–1.2)

## 2010-12-10 LAB — PROTIME-INR: Protime: 21.6 Seconds — ABNORMAL HIGH (ref 10.6–13.4)

## 2010-12-10 LAB — UA PROTEIN, DIPSTICK - CHCC: Protein, Urine: NEGATIVE mg/dL

## 2010-12-11 NOTE — Discharge Summary (Signed)
Roger Raymond, Roger Raymond               ACCOUNT NO.:  [id number]  MEDICAL RECORD NO.:  [id number]           PATIENT TYPE:  I  LOCATION:  2956                         FACILITY:  [facility]  PHYSICIAN:  Marcy Panning, MD     DATE OF BIRTH:  1951/04/23  DATE OF ADMISSION:  10/02/2010 DATE OF DISCHARGE:                              DISCHARGE SUMMARY   DISCHARGE DIAGNOSES: 1. History of grade IV glioblastoma multiforme, status post Avastin. 2. Left lower extremity extensive deep venous thrombosis, requiring     anticoagulation, and to be discharged on Lovenox and Coumadin. 3. Full Code  PAST MEDICAL HISTORY:  Other known medical history: 1. Status post right frontotemporal groove, total resection of the     tumor on May 28, 2010, with Gliadel Wafer. 2. Status post vasectomy in 1980.  MEDICATIONS ON DISCHARGE: 1. Coumadin 5 mg p.o. daily, new medication. 2. Lovenox 100 mg subcu q.d., new medication. 3. Dexamethasone 0.75 mg 1 p.o. every other day. 4. Keppra 500 mg 1 p.o. b.i.d. 5. Lomotil 1 p.o. q.6h. p.r.n. 6. Zofran 4 mg 1 to 2 p.o. q.8h. p.r.n.  LABS ON DISCHARGE:  White count 5.2, hemoglobin 11.7, hematocrit 35.2, MCV 94.1, platelet count 317,000.  Sodium 140, potassium 3.9, BUN 8, creatinine 0.89, glucose 99, calcium 8.7, total bilirubin 0.3.  Alkaline phosphatase on February 28 was 235, AST 32, ALT 50, total protein 5.9, albumin 2.7, PTT 61, PT 13.5, INR 1.01.  Heparin level as of today is 0.30.  RADIOLOGICAL STUDIES: 1. CT of the abdomen and pelvis with contrast on October 02, 2010     shows no acute intra-abdominal pathology. 2. CT angio of the chest on October 02, 2010 with contrast shows     positive for acute pulmonary thromboembolism in the right lower     lobe. 3. Status post Dopplers of the lower extremity on October 02, 2010,     consistent with DVT involving the left lower extremity, with no     evidence of Baker's cyst on the left. 4. Status post bilateral lower  extremity Doppler on October 04, 2010,     with similar findings.  No DVT on the right lower extremity.  CONDITION ON DISCHARGE:  Improved.  FOLLOWUP:  Follow up with Dr. Humphrey Rolls on October 15, 2010 at 1 p.m. for labs and at 1:45 p.m. for outpatient basis.  CONSULTATIONS:  None.  HISTORY OF PRESENT ILLNESS:  Roger Raymond is a pleasant 60 year old white male from [place], member of the [employer], with a diagnosis of grade IV glioblastoma multiforme, initially diagnosed on May 28, 2010, after he presented with left facial and left upper extremity focal seizures, and a CT of the head revealed a right posterior frontal brain tumor.  The patient was transferred to [facility], where he underwent an MRI which showed the lesion on the posterior frontal brain, thus undergoing craniotomy and resection of the tumor.  For further details, please refer to the consultation note on May 31, 2010.  The final pathology showed WHO grade IV glioblastoma multiforme.  He was treated with whole-  brain radiation therapy in [facility] under the direction of Dr. Gery Pray, and received Temodar 75 mg per meter square as well.  His treatments were administered between June 18, 2010 to August 02, 2010, tolerating them well.  A followup MRI showed suggestion of progression of the disease.  Thus, he was switched to Avastin and irinotecan, starting on August 29, 2010.  The patient received 2 weeks' worth of irinotecan and developed severe side effects, consisting of the diarrhea, failure to thrive, requiring discontinuation of irinotecan. He then was started on Avastin, last dose given on October 01, 2010. He was seen on October 01, 2010, at which time he complained of a pulling sensation in his left leg.  A Doppler study was performed, showing positive results for extensive DVT in the left femoral popliteal veins.  He was admitted to Medical Oncology  Inpatient Service at [facility], for anticoagulation and unfractionated heparin and eventual conversion to oral Coumadin.  A CT angio of the chest was performed as well, with contrast on the same day, which showed low- density filling defects in the right lower lobe pulmonary artery, consistent with acute pulmonary thromboembolism on the right lower lobe.  HOSPITAL COURSE:  Unfractionated heparin per pharmacy, with eventual transition to Coumadin over the following 48 hours was intitated on admission.  The patient's hospital stay was essentially unremarkable, and he remained in the facility, until it was felt safe for him to be discharged home and to continue anticoagulation as an outpatient.  A followup lower extremity Doppler on October 04, 2010, confirmed the acute DVT on the left lower extremity and superficial thrombus in the left lower leg as well. Lovenox was started on October 04, 2010, along with heparin. He tolerated the medicine well.  On October 05, 2010, heparin drip was discontinued at 3 in the morning, Lovenox was to remain at 1.5 mg/kg x 1 dose in the morning, and then was to receive 5 mg of Coumadin March 2.   This morning, he is verbalizing no complaints, no shortness of breath.  His left lower extremity is improving, the swelling is present, but decreasing in circumference.  He is eager to be discharged home.  DISCHARGE INSTRUCTIONS:  The patient has been instructed to follow up as an outpatient with Dr. Humphrey Rolls on October 15, 2010, at which time his labs will be monitored, and adjustments to anticoagulation will be performed. He knows to call if he has any questions or concerns.  Coumadin 5 mg p.o. daily prescription and Lovenox 100 mg subcu to the prescription have been given to him.     Sharene Butters, P.A.   Marcy Panning, MD    SW/MEDQ  D:  10/05/2010  T:  10/05/2010  Job:  [id number]  Electronically Signed by Sharene Butters P.A. on 10/05/2010 12:42:47  PM Electronically Signed by Marcy Panning MD on 12/11/2010 12:54:00 PM

## 2010-12-24 ENCOUNTER — Other Ambulatory Visit: Payer: Self-pay | Admitting: Oncology

## 2010-12-24 ENCOUNTER — Encounter (HOSPITAL_BASED_OUTPATIENT_CLINIC_OR_DEPARTMENT_OTHER): Payer: 59 | Admitting: Oncology

## 2010-12-24 DIAGNOSIS — D696 Thrombocytopenia, unspecified: Secondary | ICD-10-CM

## 2010-12-24 DIAGNOSIS — Z7901 Long term (current) use of anticoagulants: Secondary | ICD-10-CM

## 2010-12-24 DIAGNOSIS — C711 Malignant neoplasm of frontal lobe: Secondary | ICD-10-CM

## 2010-12-24 DIAGNOSIS — Z5112 Encounter for antineoplastic immunotherapy: Secondary | ICD-10-CM

## 2010-12-24 DIAGNOSIS — I824Z9 Acute embolism and thrombosis of unspecified deep veins of unspecified distal lower extremity: Secondary | ICD-10-CM

## 2010-12-24 DIAGNOSIS — I82409 Acute embolism and thrombosis of unspecified deep veins of unspecified lower extremity: Secondary | ICD-10-CM

## 2010-12-24 LAB — COMPREHENSIVE METABOLIC PANEL
ALT: 99 U/L — ABNORMAL HIGH (ref 0–53)
Albumin: 4.1 g/dL (ref 3.5–5.2)
CO2: 27 mEq/L (ref 19–32)
Calcium: 9.4 mg/dL (ref 8.4–10.5)
Chloride: 105 mEq/L (ref 96–112)
Potassium: 3.7 mEq/L (ref 3.5–5.3)
Sodium: 142 mEq/L (ref 135–145)
Total Bilirubin: 0.4 mg/dL (ref 0.3–1.2)
Total Protein: 6.3 g/dL (ref 6.0–8.3)

## 2010-12-24 LAB — CBC WITH DIFFERENTIAL/PLATELET
BASO%: 0.6 % (ref 0.0–2.0)
Eosinophils Absolute: 0.1 10*3/uL (ref 0.0–0.5)
MCHC: 34.2 g/dL (ref 32.0–36.0)
MONO#: 0.3 10*3/uL (ref 0.1–0.9)
NEUT#: 2.2 10*3/uL (ref 1.5–6.5)
RBC: 3.97 10*6/uL — ABNORMAL LOW (ref 4.20–5.82)
RDW: 15.2 % — ABNORMAL HIGH (ref 11.0–14.6)
WBC: 3.6 10*3/uL — ABNORMAL LOW (ref 4.0–10.3)
lymph#: 1 10*3/uL (ref 0.9–3.3)

## 2010-12-24 LAB — PROTIME-INR: Protime: 37.2 Seconds — ABNORMAL HIGH (ref 10.6–13.4)

## 2011-01-07 ENCOUNTER — Encounter (HOSPITAL_BASED_OUTPATIENT_CLINIC_OR_DEPARTMENT_OTHER): Payer: 59 | Admitting: Oncology

## 2011-01-07 ENCOUNTER — Other Ambulatory Visit: Payer: Self-pay | Admitting: Physician Assistant

## 2011-01-07 DIAGNOSIS — D696 Thrombocytopenia, unspecified: Secondary | ICD-10-CM

## 2011-01-07 DIAGNOSIS — C711 Malignant neoplasm of frontal lobe: Secondary | ICD-10-CM

## 2011-01-07 LAB — CBC WITH DIFFERENTIAL/PLATELET
Basophils Absolute: 0 10*3/uL (ref 0.0–0.1)
EOS%: 0.8 % (ref 0.0–7.0)
Eosinophils Absolute: 0 10*3/uL (ref 0.0–0.5)
LYMPH%: 17.3 % (ref 14.0–49.0)
MCH: 33.4 pg (ref 27.2–33.4)
MCV: 95 fL (ref 79.3–98.0)
MONO%: 3.9 % (ref 0.0–14.0)
NEUT#: 4 10*3/uL (ref 1.5–6.5)
Platelets: 32 10*3/uL — ABNORMAL LOW (ref 140–400)
RBC: 4.19 10*6/uL — ABNORMAL LOW (ref 4.20–5.82)

## 2011-01-07 LAB — PROTIME-INR: Protime: 10.8 Seconds (ref 10.6–13.4)

## 2011-01-09 ENCOUNTER — Encounter (HOSPITAL_BASED_OUTPATIENT_CLINIC_OR_DEPARTMENT_OTHER): Payer: 59 | Admitting: Oncology

## 2011-01-09 ENCOUNTER — Encounter (HOSPITAL_COMMUNITY)
Admission: RE | Admit: 2011-01-09 | Discharge: 2011-01-09 | Disposition: A | Discharge status: Home / Self Care | Payer: 59 | Source: Ambulatory Visit | Attending: Oncology | Admitting: Oncology

## 2011-01-09 DIAGNOSIS — D696 Thrombocytopenia, unspecified: Secondary | ICD-10-CM

## 2011-01-09 DIAGNOSIS — D649 Anemia, unspecified: Secondary | ICD-10-CM | POA: Insufficient documentation

## 2011-01-10 LAB — PREPARE PLATELET PHERESIS

## 2011-01-21 ENCOUNTER — Other Ambulatory Visit: Payer: Self-pay | Admitting: Oncology

## 2011-01-21 ENCOUNTER — Ambulatory Visit (HOSPITAL_COMMUNITY)
Admission: RE | Admit: 2011-01-21 | Discharge: 2011-01-21 | Disposition: A | Discharge status: Home / Self Care | Payer: 59 | Source: Ambulatory Visit | Attending: Oncology | Admitting: Oncology

## 2011-01-21 ENCOUNTER — Encounter (HOSPITAL_BASED_OUTPATIENT_CLINIC_OR_DEPARTMENT_OTHER): Payer: 59 | Admitting: Oncology

## 2011-01-21 DIAGNOSIS — Z86718 Personal history of other venous thrombosis and embolism: Secondary | ICD-10-CM | POA: Insufficient documentation

## 2011-01-21 DIAGNOSIS — Z7901 Long term (current) use of anticoagulants: Secondary | ICD-10-CM | POA: Insufficient documentation

## 2011-01-21 DIAGNOSIS — M7989 Other specified soft tissue disorders: Secondary | ICD-10-CM

## 2011-01-21 DIAGNOSIS — C711 Malignant neoplasm of frontal lobe: Secondary | ICD-10-CM

## 2011-01-21 DIAGNOSIS — D696 Thrombocytopenia, unspecified: Secondary | ICD-10-CM

## 2011-01-21 DIAGNOSIS — Z79899 Other long term (current) drug therapy: Secondary | ICD-10-CM | POA: Insufficient documentation

## 2011-01-21 DIAGNOSIS — C719 Malignant neoplasm of brain, unspecified: Secondary | ICD-10-CM | POA: Insufficient documentation

## 2011-01-21 DIAGNOSIS — Z5111 Encounter for antineoplastic chemotherapy: Secondary | ICD-10-CM

## 2011-01-21 LAB — COMPREHENSIVE METABOLIC PANEL
ALT: 130 U/L — ABNORMAL HIGH (ref 0–53)
CO2: 27 mEq/L (ref 19–32)
Calcium: 9.6 mg/dL (ref 8.4–10.5)
Chloride: 104 mEq/L (ref 96–112)
Potassium: 4.7 mEq/L (ref 3.5–5.3)
Sodium: 141 mEq/L (ref 135–145)
Total Protein: 6.6 g/dL (ref 6.0–8.3)

## 2011-01-21 LAB — CBC WITH DIFFERENTIAL/PLATELET
Basophils Absolute: 0 10*3/uL (ref 0.0–0.1)
HCT: 39.5 % (ref 38.4–49.9)
HGB: 13.8 g/dL (ref 13.0–17.1)
MONO#: 0.3 10*3/uL (ref 0.1–0.9)
NEUT%: 50.7 % (ref 39.0–75.0)
WBC: 2.6 10*3/uL — ABNORMAL LOW (ref 4.0–10.3)
lymph#: 1 10*3/uL (ref 0.9–3.3)

## 2011-01-21 LAB — PROTHROMBIN TIME: INR: 0.95 (ref <1.50)

## 2011-01-30 ENCOUNTER — Encounter (HOSPITAL_COMMUNITY): Payer: 59

## 2011-02-04 ENCOUNTER — Other Ambulatory Visit: Payer: Self-pay | Admitting: Oncology

## 2011-02-04 ENCOUNTER — Encounter (HOSPITAL_BASED_OUTPATIENT_CLINIC_OR_DEPARTMENT_OTHER): Payer: 59 | Admitting: Oncology

## 2011-02-04 DIAGNOSIS — D696 Thrombocytopenia, unspecified: Secondary | ICD-10-CM

## 2011-02-04 DIAGNOSIS — Z5111 Encounter for antineoplastic chemotherapy: Secondary | ICD-10-CM

## 2011-02-04 DIAGNOSIS — C711 Malignant neoplasm of frontal lobe: Secondary | ICD-10-CM

## 2011-02-04 DIAGNOSIS — I824Z9 Acute embolism and thrombosis of unspecified deep veins of unspecified distal lower extremity: Secondary | ICD-10-CM

## 2011-02-04 LAB — COMPREHENSIVE METABOLIC PANEL
ALT: 82 U/L — ABNORMAL HIGH (ref 0–53)
Albumin: 4.5 g/dL (ref 3.5–5.2)
Alkaline Phosphatase: 126 U/L — ABNORMAL HIGH (ref 39–117)
Glucose, Bld: 87 mg/dL (ref 70–99)
Potassium: 4.2 mEq/L (ref 3.5–5.3)
Sodium: 141 mEq/L (ref 135–145)
Total Protein: 6.5 g/dL (ref 6.0–8.3)

## 2011-02-04 LAB — CBC WITH DIFFERENTIAL/PLATELET
Basophils Absolute: 0 10*3/uL (ref 0.0–0.1)
EOS%: 1 % (ref 0.0–7.0)
Eosinophils Absolute: 0 10*3/uL (ref 0.0–0.5)
HCT: 39.9 % (ref 38.4–49.9)
HGB: 14 g/dL (ref 13.0–17.1)
MCH: 33.7 pg — ABNORMAL HIGH (ref 27.2–33.4)
MCV: 95.9 fL (ref 79.3–98.0)
MONO%: 9 % (ref 0.0–14.0)
NEUT#: 1.6 10*3/uL (ref 1.5–6.5)
NEUT%: 52.5 % (ref 39.0–75.0)
lymph#: 1.2 10*3/uL (ref 0.9–3.3)

## 2011-02-04 LAB — UA PROTEIN, DIPSTICK - CHCC: Protein, Urine: NEGATIVE mg/dL

## 2011-02-04 LAB — PROTIME-INR: INR: 2.6 (ref 2.00–3.50)

## 2011-02-18 ENCOUNTER — Other Ambulatory Visit: Payer: Self-pay | Admitting: Oncology

## 2011-02-18 ENCOUNTER — Encounter (HOSPITAL_BASED_OUTPATIENT_CLINIC_OR_DEPARTMENT_OTHER): Payer: 59 | Admitting: Oncology

## 2011-02-18 DIAGNOSIS — Z7901 Long term (current) use of anticoagulants: Secondary | ICD-10-CM

## 2011-02-18 DIAGNOSIS — D696 Thrombocytopenia, unspecified: Secondary | ICD-10-CM

## 2011-02-18 DIAGNOSIS — C711 Malignant neoplasm of frontal lobe: Secondary | ICD-10-CM

## 2011-02-18 DIAGNOSIS — I82409 Acute embolism and thrombosis of unspecified deep veins of unspecified lower extremity: Secondary | ICD-10-CM

## 2011-02-18 DIAGNOSIS — Z5112 Encounter for antineoplastic immunotherapy: Secondary | ICD-10-CM

## 2011-02-18 LAB — COMPREHENSIVE METABOLIC PANEL
Alkaline Phosphatase: 119 U/L — ABNORMAL HIGH (ref 39–117)
BUN: 16 mg/dL (ref 6–23)
Creatinine, Ser: 1 mg/dL (ref 0.50–1.35)
Glucose, Bld: 82 mg/dL (ref 70–99)
Sodium: 139 mEq/L (ref 135–145)
Total Bilirubin: 0.3 mg/dL (ref 0.3–1.2)

## 2011-02-18 LAB — CBC WITH DIFFERENTIAL/PLATELET
BASO%: 0.6 % (ref 0.0–2.0)
EOS%: 2.5 % (ref 0.0–7.0)
LYMPH%: 27.3 % (ref 14.0–49.0)
MCHC: 35 g/dL (ref 32.0–36.0)
MONO#: 0.3 10*3/uL (ref 0.1–0.9)
Platelets: 218 10*3/uL (ref 140–400)
RBC: 4.38 10*6/uL (ref 4.20–5.82)
WBC: 4.7 10*3/uL (ref 4.0–10.3)
nRBC: 0 % (ref 0–0)

## 2011-02-18 LAB — PROTIME-INR: INR: 3 (ref 2.00–3.50)

## 2011-02-18 LAB — URINALYSIS, MICROSCOPIC - CHCC
Ketones: NEGATIVE mg/dL
Leukocyte Esterase: NEGATIVE
Nitrite: NEGATIVE
Protein: NEGATIVE mg/dL

## 2011-03-04 ENCOUNTER — Other Ambulatory Visit: Payer: Self-pay | Admitting: Oncology

## 2011-03-04 ENCOUNTER — Encounter (HOSPITAL_BASED_OUTPATIENT_CLINIC_OR_DEPARTMENT_OTHER): Payer: 59

## 2011-03-04 DIAGNOSIS — Z5112 Encounter for antineoplastic immunotherapy: Secondary | ICD-10-CM

## 2011-03-04 DIAGNOSIS — Z5111 Encounter for antineoplastic chemotherapy: Secondary | ICD-10-CM

## 2011-03-04 LAB — COMPREHENSIVE METABOLIC PANEL
ALT: 46 U/L (ref 0–53)
AST: 37 U/L (ref 0–37)
Albumin: 4.4 g/dL (ref 3.5–5.2)
Alkaline Phosphatase: 90 U/L (ref 39–117)
Calcium: 9 mg/dL (ref 8.4–10.5)
Chloride: 104 mEq/L (ref 96–112)
Potassium: 4.1 mEq/L (ref 3.5–5.3)
Sodium: 137 mEq/L (ref 135–145)
Total Protein: 7 g/dL (ref 6.0–8.3)

## 2011-03-04 LAB — CBC WITH DIFFERENTIAL (CANCER CENTER ONLY)
BASO#: 0 10e3/uL (ref 0.0–0.2)
BASO%: 0.5 % (ref 0.0–2.0)
EOS%: 3.4 % (ref 0.0–7.0)
Eosinophils Absolute: 0.1 10e3/uL (ref 0.0–0.5)
HCT: 43.2 % (ref 38.7–49.9)
HGB: 15.7 g/dL (ref 13.0–17.1)
LYMPH#: 1 10e3/uL (ref 0.9–3.3)
LYMPH%: 23.9 % (ref 14.0–48.0)
MCH: 34.2 pg — ABNORMAL HIGH (ref 28.0–33.4)
MCHC: 36.3 g/dL — ABNORMAL HIGH (ref 32.0–35.9)
MCV: 94 fL (ref 82–98)
MONO#: 0.4 10e3/uL (ref 0.1–0.9)
MONO%: 9.6 % (ref 0.0–13.0)
NEUT#: 2.5 10e3/uL (ref 1.5–6.5)
NEUT%: 62.6 % (ref 40.0–80.0)
Platelets: 169 10e3/uL (ref 145–400)
RBC: 4.59 10e6/uL (ref 4.20–5.70)
RDW: 12.7 % (ref 11.1–15.7)
WBC: 4.1 10e3/uL (ref 4.0–10.0)

## 2011-03-04 LAB — PROTIME-INR (CHCC SATELLITE)
INR: 3.6 — ABNORMAL HIGH (ref 2.0–3.5)
Protime: 43.2 Seconds — ABNORMAL HIGH (ref 10.6–13.4)

## 2011-03-04 LAB — URINALYSIS, MICROSCOPIC (CHCC SATELLITE)
Bilirubin (Urine): NEGATIVE
Ketones: NEGATIVE mg/dL
Protein: NEGATIVE mg/dL
Specific Gravity, Urine: 1.01 (ref 1.003–1.035)
pH: 6 (ref 4.60–8.00)

## 2011-03-18 ENCOUNTER — Encounter (HOSPITAL_BASED_OUTPATIENT_CLINIC_OR_DEPARTMENT_OTHER): Payer: 59 | Admitting: Oncology

## 2011-03-18 ENCOUNTER — Other Ambulatory Visit: Payer: Self-pay | Admitting: Oncology

## 2011-03-18 DIAGNOSIS — Z5112 Encounter for antineoplastic immunotherapy: Secondary | ICD-10-CM

## 2011-03-18 DIAGNOSIS — D696 Thrombocytopenia, unspecified: Secondary | ICD-10-CM

## 2011-03-18 DIAGNOSIS — C711 Malignant neoplasm of frontal lobe: Secondary | ICD-10-CM

## 2011-03-18 DIAGNOSIS — I82409 Acute embolism and thrombosis of unspecified deep veins of unspecified lower extremity: Secondary | ICD-10-CM

## 2011-03-18 LAB — CBC WITH DIFFERENTIAL/PLATELET
Basophils Absolute: 0 10*3/uL (ref 0.0–0.1)
EOS%: 5.1 % (ref 0.0–7.0)
HCT: 42.1 % (ref 38.4–49.9)
HGB: 14.7 g/dL (ref 13.0–17.1)
LYMPH%: 28.6 % (ref 14.0–49.0)
MCH: 33.3 pg (ref 27.2–33.4)
MCHC: 34.9 g/dL (ref 32.0–36.0)
MCV: 95.2 fL (ref 79.3–98.0)
MONO%: 5.7 % (ref 0.0–14.0)
NEUT%: 60.6 % (ref 39.0–75.0)
Platelets: 27 10*3/uL — ABNORMAL LOW (ref 140–400)
lymph#: 1 10*3/uL (ref 0.9–3.3)

## 2011-03-18 LAB — COMPREHENSIVE METABOLIC PANEL WITH GFR
ALT: 59 U/L — ABNORMAL HIGH (ref 0–53)
AST: 45 U/L — ABNORMAL HIGH (ref 0–37)
Albumin: 4 g/dL (ref 3.5–5.2)
Alkaline Phosphatase: 101 U/L (ref 39–117)
BUN: 17 mg/dL (ref 6–23)
CO2: 19 meq/L (ref 19–32)
Calcium: 8.6 mg/dL (ref 8.4–10.5)
Chloride: 106 meq/L (ref 96–112)
Creatinine, Ser: 0.88 mg/dL (ref 0.50–1.35)
Glucose, Bld: 88 mg/dL (ref 70–99)
Potassium: 4.2 meq/L (ref 3.5–5.3)
Sodium: 138 meq/L (ref 135–145)
Total Bilirubin: 0.3 mg/dL (ref 0.3–1.2)
Total Protein: 6.5 g/dL (ref 6.0–8.3)

## 2011-03-18 LAB — URINALYSIS, MICROSCOPIC - CHCC
Ketones: NEGATIVE mg/dL
Nitrite: NEGATIVE
Protein: NEGATIVE mg/dL
RBC count: NEGATIVE (ref 0–2)
pH: 5 (ref 4.6–8.0)

## 2011-04-01 ENCOUNTER — Other Ambulatory Visit: Payer: Self-pay | Admitting: Oncology

## 2011-04-01 ENCOUNTER — Encounter (HOSPITAL_BASED_OUTPATIENT_CLINIC_OR_DEPARTMENT_OTHER): Payer: 59 | Admitting: Oncology

## 2011-04-01 DIAGNOSIS — Z7901 Long term (current) use of anticoagulants: Secondary | ICD-10-CM

## 2011-04-01 DIAGNOSIS — Z5112 Encounter for antineoplastic immunotherapy: Secondary | ICD-10-CM

## 2011-04-01 DIAGNOSIS — C711 Malignant neoplasm of frontal lobe: Secondary | ICD-10-CM

## 2011-04-01 DIAGNOSIS — D696 Thrombocytopenia, unspecified: Secondary | ICD-10-CM

## 2011-04-01 DIAGNOSIS — I82409 Acute embolism and thrombosis of unspecified deep veins of unspecified lower extremity: Secondary | ICD-10-CM

## 2011-04-01 LAB — CBC & DIFF AND RETIC
BASO%: 0.5 % (ref 0.0–2.0)
EOS%: 1.9 % (ref 0.0–7.0)
Immature Retic Fract: 14.7 % — ABNORMAL HIGH (ref 3.00–10.60)
LYMPH%: 43.3 % (ref 14.0–49.0)
MCH: 32.9 pg (ref 27.2–33.4)
MCHC: 35 g/dL (ref 32.0–36.0)
MONO#: 0.2 10*3/uL (ref 0.1–0.9)
NEUT%: 44.1 % (ref 39.0–75.0)
Platelets: 162 10*3/uL (ref 140–400)
RBC: 4.22 10*6/uL (ref 4.20–5.82)
WBC: 2.2 10*3/uL — ABNORMAL LOW (ref 4.0–10.3)
nRBC: 0 % (ref 0–0)

## 2011-04-01 LAB — COMPREHENSIVE METABOLIC PANEL
ALT: 59 U/L — ABNORMAL HIGH (ref 0–53)
AST: 42 U/L — ABNORMAL HIGH (ref 0–37)
Alkaline Phosphatase: 98 U/L (ref 39–117)
Glucose, Bld: 94 mg/dL (ref 70–99)
Potassium: 4.4 mEq/L (ref 3.5–5.3)
Sodium: 139 mEq/L (ref 135–145)
Total Bilirubin: 0.4 mg/dL (ref 0.3–1.2)
Total Protein: 6.5 g/dL (ref 6.0–8.3)

## 2011-04-01 LAB — URINALYSIS, MICROSCOPIC - CHCC
Ketones: NEGATIVE mg/dL
Nitrite: NEGATIVE
Protein: NEGATIVE mg/dL
RBC count: NEGATIVE (ref 0–2)
WBC, UA: NEGATIVE (ref 0–2)

## 2011-04-15 ENCOUNTER — Other Ambulatory Visit: Payer: Self-pay | Admitting: Oncology

## 2011-04-15 ENCOUNTER — Encounter (HOSPITAL_BASED_OUTPATIENT_CLINIC_OR_DEPARTMENT_OTHER): Payer: 59 | Admitting: Oncology

## 2011-04-15 DIAGNOSIS — C711 Malignant neoplasm of frontal lobe: Secondary | ICD-10-CM

## 2011-04-15 DIAGNOSIS — D696 Thrombocytopenia, unspecified: Secondary | ICD-10-CM

## 2011-04-15 DIAGNOSIS — I82409 Acute embolism and thrombosis of unspecified deep veins of unspecified lower extremity: Secondary | ICD-10-CM

## 2011-04-15 DIAGNOSIS — Z5112 Encounter for antineoplastic immunotherapy: Secondary | ICD-10-CM

## 2011-04-15 DIAGNOSIS — Z5111 Encounter for antineoplastic chemotherapy: Secondary | ICD-10-CM

## 2011-04-15 DIAGNOSIS — Z7901 Long term (current) use of anticoagulants: Secondary | ICD-10-CM

## 2011-04-15 LAB — CBC WITH DIFFERENTIAL/PLATELET
Basophils Absolute: 0 10*3/uL (ref 0.0–0.1)
Eosinophils Absolute: 0 10*3/uL (ref 0.0–0.5)
HCT: 40.4 % (ref 38.4–49.9)
HGB: 14.3 g/dL (ref 13.0–17.1)
MCV: 95.1 fL (ref 79.3–98.0)
MONO%: 12.4 % (ref 0.0–14.0)
NEUT#: 1.6 10*3/uL (ref 1.5–6.5)
NEUT%: 56.1 % (ref 39.0–75.0)
RDW: 14.8 % — ABNORMAL HIGH (ref 11.0–14.6)

## 2011-04-15 LAB — COMPREHENSIVE METABOLIC PANEL
Albumin: 4.2 g/dL (ref 3.5–5.2)
Alkaline Phosphatase: 98 U/L (ref 39–117)
BUN: 14 mg/dL (ref 6–23)
Calcium: 8.9 mg/dL (ref 8.4–10.5)
Chloride: 106 mEq/L (ref 96–112)
Glucose, Bld: 87 mg/dL (ref 70–99)
Potassium: 4.5 mEq/L (ref 3.5–5.3)

## 2011-04-15 LAB — PROTIME-INR: INR: 2.7 (ref 2.00–3.50)

## 2011-04-30 ENCOUNTER — Other Ambulatory Visit: Payer: Self-pay | Admitting: Oncology

## 2011-04-30 ENCOUNTER — Other Ambulatory Visit: Payer: Self-pay | Admitting: Physician Assistant

## 2011-04-30 ENCOUNTER — Encounter (HOSPITAL_BASED_OUTPATIENT_CLINIC_OR_DEPARTMENT_OTHER): Payer: 59 | Admitting: Oncology

## 2011-04-30 DIAGNOSIS — Z5111 Encounter for antineoplastic chemotherapy: Secondary | ICD-10-CM

## 2011-04-30 DIAGNOSIS — Z5112 Encounter for antineoplastic immunotherapy: Secondary | ICD-10-CM

## 2011-04-30 DIAGNOSIS — I82409 Acute embolism and thrombosis of unspecified deep veins of unspecified lower extremity: Secondary | ICD-10-CM

## 2011-04-30 DIAGNOSIS — C711 Malignant neoplasm of frontal lobe: Secondary | ICD-10-CM

## 2011-04-30 DIAGNOSIS — Z7901 Long term (current) use of anticoagulants: Secondary | ICD-10-CM

## 2011-04-30 DIAGNOSIS — D696 Thrombocytopenia, unspecified: Secondary | ICD-10-CM

## 2011-04-30 LAB — CBC WITH DIFFERENTIAL/PLATELET
BASO%: 0.5 % (ref 0.0–2.0)
EOS%: 0.7 % (ref 0.0–7.0)
MCH: 33.9 pg — ABNORMAL HIGH (ref 27.2–33.4)
MCHC: 35 g/dL (ref 32.0–36.0)
RBC: 4.45 10*6/uL (ref 4.20–5.82)
RDW: 14.4 % (ref 11.0–14.6)
lymph#: 0.7 10*3/uL — ABNORMAL LOW (ref 0.9–3.3)

## 2011-04-30 LAB — PROTIME-INR: INR: 3 (ref 2.00–3.50)

## 2011-04-30 LAB — COMPREHENSIVE METABOLIC PANEL
AST: 35 U/L (ref 0–37)
Albumin: 3.7 g/dL (ref 3.5–5.2)
Alkaline Phosphatase: 100 U/L (ref 39–117)
Calcium: 8.8 mg/dL (ref 8.4–10.5)
Chloride: 100 mEq/L (ref 96–112)
Potassium: 4.5 mEq/L (ref 3.5–5.3)
Sodium: 137 mEq/L (ref 135–145)
Total Protein: 6.6 g/dL (ref 6.0–8.3)

## 2011-05-08 ENCOUNTER — Encounter: Payer: Self-pay | Admitting: *Deleted

## 2011-05-13 ENCOUNTER — Other Ambulatory Visit: Payer: Self-pay | Admitting: Oncology

## 2011-05-13 ENCOUNTER — Encounter (HOSPITAL_BASED_OUTPATIENT_CLINIC_OR_DEPARTMENT_OTHER): Payer: 59 | Admitting: Oncology

## 2011-05-13 DIAGNOSIS — Z7901 Long term (current) use of anticoagulants: Secondary | ICD-10-CM

## 2011-05-13 DIAGNOSIS — I82409 Acute embolism and thrombosis of unspecified deep veins of unspecified lower extremity: Secondary | ICD-10-CM

## 2011-05-13 DIAGNOSIS — Z5112 Encounter for antineoplastic immunotherapy: Secondary | ICD-10-CM

## 2011-05-13 DIAGNOSIS — D696 Thrombocytopenia, unspecified: Secondary | ICD-10-CM

## 2011-05-13 DIAGNOSIS — C711 Malignant neoplasm of frontal lobe: Secondary | ICD-10-CM

## 2011-05-13 LAB — CBC WITH DIFFERENTIAL/PLATELET
BASO%: 0.4 % (ref 0.0–2.0)
Eosinophils Absolute: 0.1 10*3/uL (ref 0.0–0.5)
HCT: 37.4 % — ABNORMAL LOW (ref 38.4–49.9)
LYMPH%: 27.9 % (ref 14.0–49.0)
MCHC: 34 g/dL (ref 32.0–36.0)
MONO#: 0.3 10*3/uL (ref 0.1–0.9)
NEUT%: 56.3 % (ref 39.0–75.0)
Platelets: 191 10*3/uL (ref 140–400)
WBC: 2.7 10*3/uL — ABNORMAL LOW (ref 4.0–10.3)

## 2011-05-13 LAB — COMPREHENSIVE METABOLIC PANEL
Albumin: 3.5 g/dL (ref 3.5–5.2)
Alkaline Phosphatase: 145 U/L — ABNORMAL HIGH (ref 39–117)
BUN: 14 mg/dL (ref 6–23)
Calcium: 9 mg/dL (ref 8.4–10.5)
Chloride: 104 mEq/L (ref 96–112)
Creatinine, Ser: 0.97 mg/dL (ref 0.50–1.35)
Glucose, Bld: 92 mg/dL (ref 70–99)
Potassium: 4 mEq/L (ref 3.5–5.3)

## 2011-05-21 ENCOUNTER — Other Ambulatory Visit: Payer: Self-pay | Admitting: Physician Assistant

## 2011-05-21 DIAGNOSIS — C711 Malignant neoplasm of frontal lobe: Secondary | ICD-10-CM | POA: Insufficient documentation

## 2011-05-21 DIAGNOSIS — C719 Malignant neoplasm of brain, unspecified: Secondary | ICD-10-CM

## 2011-05-27 ENCOUNTER — Other Ambulatory Visit: Payer: Self-pay | Admitting: Oncology

## 2011-05-27 ENCOUNTER — Encounter (HOSPITAL_BASED_OUTPATIENT_CLINIC_OR_DEPARTMENT_OTHER): Payer: 59 | Admitting: Oncology

## 2011-05-27 DIAGNOSIS — Z7901 Long term (current) use of anticoagulants: Secondary | ICD-10-CM

## 2011-05-27 DIAGNOSIS — C711 Malignant neoplasm of frontal lobe: Secondary | ICD-10-CM

## 2011-05-27 DIAGNOSIS — Z5112 Encounter for antineoplastic immunotherapy: Secondary | ICD-10-CM

## 2011-05-27 DIAGNOSIS — I82409 Acute embolism and thrombosis of unspecified deep veins of unspecified lower extremity: Secondary | ICD-10-CM

## 2011-05-27 LAB — CBC WITH DIFFERENTIAL/PLATELET
Basophils Absolute: 0 10*3/uL (ref 0.0–0.1)
Eosinophils Absolute: 0.1 10*3/uL (ref 0.0–0.5)
HCT: 41.4 % (ref 38.4–49.9)
HGB: 13.9 g/dL (ref 13.0–17.1)
NEUT#: 2.2 10*3/uL (ref 1.5–6.5)
RDW: 13.8 % (ref 11.0–14.6)
lymph#: 0.9 10*3/uL (ref 0.9–3.3)

## 2011-05-27 LAB — COMPREHENSIVE METABOLIC PANEL
ALT: 59 U/L — ABNORMAL HIGH (ref 0–53)
AST: 48 U/L — ABNORMAL HIGH (ref 0–37)
Albumin: 4 g/dL (ref 3.5–5.2)
Calcium: 8.9 mg/dL (ref 8.4–10.5)
Chloride: 107 mEq/L (ref 96–112)
Potassium: 4.5 mEq/L (ref 3.5–5.3)
Sodium: 141 mEq/L (ref 135–145)
Total Protein: 6.5 g/dL (ref 6.0–8.3)

## 2011-05-27 LAB — PROTIME-INR: INR: 2.2 (ref 2.00–3.50)

## 2011-05-28 ENCOUNTER — Other Ambulatory Visit: Payer: Self-pay | Admitting: Physician Assistant

## 2011-05-28 DIAGNOSIS — C719 Malignant neoplasm of brain, unspecified: Secondary | ICD-10-CM

## 2011-06-10 ENCOUNTER — Ambulatory Visit (HOSPITAL_BASED_OUTPATIENT_CLINIC_OR_DEPARTMENT_OTHER): Payer: 59

## 2011-06-10 ENCOUNTER — Telehealth: Payer: Self-pay | Admitting: Oncology

## 2011-06-10 ENCOUNTER — Other Ambulatory Visit: Payer: Self-pay | Admitting: Oncology

## 2011-06-10 ENCOUNTER — Ambulatory Visit: Payer: 59

## 2011-06-10 ENCOUNTER — Other Ambulatory Visit (HOSPITAL_BASED_OUTPATIENT_CLINIC_OR_DEPARTMENT_OTHER): Payer: 59 | Admitting: Lab

## 2011-06-10 ENCOUNTER — Ambulatory Visit (HOSPITAL_BASED_OUTPATIENT_CLINIC_OR_DEPARTMENT_OTHER): Payer: 59 | Admitting: Physician Assistant

## 2011-06-10 ENCOUNTER — Other Ambulatory Visit: Payer: Self-pay | Admitting: Physician Assistant

## 2011-06-10 DIAGNOSIS — Z5181 Encounter for therapeutic drug level monitoring: Secondary | ICD-10-CM

## 2011-06-10 DIAGNOSIS — C711 Malignant neoplasm of frontal lobe: Secondary | ICD-10-CM

## 2011-06-10 DIAGNOSIS — Z7901 Long term (current) use of anticoagulants: Secondary | ICD-10-CM

## 2011-06-10 DIAGNOSIS — C719 Malignant neoplasm of brain, unspecified: Secondary | ICD-10-CM

## 2011-06-10 DIAGNOSIS — D696 Thrombocytopenia, unspecified: Secondary | ICD-10-CM

## 2011-06-10 DIAGNOSIS — Z5112 Encounter for antineoplastic immunotherapy: Secondary | ICD-10-CM

## 2011-06-10 DIAGNOSIS — I2699 Other pulmonary embolism without acute cor pulmonale: Secondary | ICD-10-CM

## 2011-06-10 DIAGNOSIS — I82409 Acute embolism and thrombosis of unspecified deep veins of unspecified lower extremity: Secondary | ICD-10-CM

## 2011-06-10 LAB — CBC WITH DIFFERENTIAL/PLATELET
BASO%: 0.3 % (ref 0.0–2.0)
Eosinophils Absolute: 0.2 10*3/uL (ref 0.0–0.5)
HGB: 15.7 g/dL (ref 13.0–17.1)
MCV: 98.3 fL — ABNORMAL HIGH (ref 79.3–98.0)
MONO%: 8.7 % (ref 0.0–14.0)
RBC: 4.68 10*6/uL (ref 4.20–5.82)
RDW: 13.3 % (ref 11.0–14.6)
nRBC: 0 % (ref 0–0)

## 2011-06-10 LAB — PROTIME-INR
INR: 3.2 (ref 2.00–3.50)
Protime: 38.4 Seconds — ABNORMAL HIGH (ref 10.6–13.4)

## 2011-06-10 LAB — UA PROTEIN, DIPSTICK - CHCC: Protein, Urine: NEGATIVE mg/dL

## 2011-06-10 MED ORDER — EPINEPHRINE HCL 0.1 MG/ML IJ SOLN
0.2500 mg | Freq: Once | INTRAMUSCULAR | Status: DC | PRN
  Filled 2011-06-10: qty 10

## 2011-06-10 MED ORDER — HEPARIN SOD (PORK) LOCK FLUSH 100 UNIT/ML IV SOLN
500.0000 [IU] | Freq: Once | INTRAVENOUS | Status: DC | PRN
  Filled 2011-06-10: qty 5

## 2011-06-10 MED ORDER — SODIUM CHLORIDE 0.9 % IV SOLN
10.0000 mg/kg | Freq: Once | INTRAVENOUS | Status: AC
  Administered 2011-06-10: 725 mg via INTRAVENOUS
  Filled 2011-06-10: qty 29

## 2011-06-10 MED ORDER — DIPHENHYDRAMINE HCL 50 MG/ML IJ SOLN: 50.0000 mg | Freq: Once | INTRAMUSCULAR | Status: DC | PRN

## 2011-06-10 MED ORDER — HEPARIN SOD (PORK) LOCK FLUSH 100 UNIT/ML IV SOLN
250.0000 [IU] | Freq: Once | INTRAVENOUS | Status: DC | PRN
  Filled 2011-06-10: qty 5

## 2011-06-10 MED ORDER — SODIUM CHLORIDE 0.9 % IJ SOLN
10.0000 mL | INTRAMUSCULAR | Status: DC | PRN
  Filled 2011-06-10: qty 10

## 2011-06-10 MED ORDER — DIPHENHYDRAMINE HCL 50 MG/ML IJ SOLN: 25.0000 mg | Freq: Once | INTRAMUSCULAR | Status: DC | PRN

## 2011-06-10 MED ORDER — ALBUTEROL SULFATE (2.5 MG/3ML) 0.083% IN NEBU
2.5000 mg | INHALATION_SOLUTION | Freq: Once | RESPIRATORY_TRACT | Status: DC | PRN
  Filled 2011-06-10: qty 3

## 2011-06-10 MED ORDER — SODIUM CHLORIDE 0.9 % IV SOLN: Freq: Once | INTRAVENOUS | Status: DC | PRN

## 2011-06-10 MED ORDER — METHYLPREDNISOLONE SODIUM SUCC 125 MG IJ SOLR: 125.0000 mg | Freq: Once | INTRAMUSCULAR | Status: DC | PRN

## 2011-06-10 MED ORDER — SODIUM CHLORIDE 0.9 % IV SOLN: Freq: Once | INTRAVENOUS | Status: DC

## 2011-06-10 MED ORDER — SODIUM CHLORIDE 0.9 % IJ SOLN
3.0000 mL | INTRAMUSCULAR | Status: DC | PRN
  Filled 2011-06-10: qty 10

## 2011-06-10 NOTE — Telephone Encounter (Signed)
gv pt appt for [id number] °

## 2011-06-10 NOTE — Progress Notes (Deleted)
DIAGNOSIS:  A 60 year old [place], [place] gentleman with history of glioblastoma multiforme on Avastin q.2 weeks, Temodar dosed on days 1 through 5 and a history of a left lower extremity deep venous thrombosis with prior history of pulmonary embolus on Coumadin 12.5 mg p.o. daily.  SUBJECTIVE:  Roger Raymond is seen today with his wife in accompaniment prior to his next q.2-week dose of Avastin.  Of note, he would be due to resume his Temodar dosing in the near future.  He continues on Coumadin at 12.5 mg per day.  He feels well denying any fevers, chills, night sweats, shortness of breath, chest pain.  No nausea, emesis, diarrhea, or constipation issues.  He has had occasional cramping of his left calf but none over the past 2 weeks.  He has occasional nose bleeds occur when he blows his nose but no gingival bleeding, no hemoptysis, no hematochezia or hematuria.  Energy level is quite good.  Of note, he will be traveling to [place] for 3 months in January 2013.  He has located an oncologist that specializes in neuro-oncology in [place] by the name of Roger Raymond.  I am sorry, I do not know the exact name or address.  ALLERGIES:  Are as reviewed per EMR.  ECOG STATUS:  Zero.  MEDICATION LIST:  Has been reviewed.  OBJECTIVE/PHYSICAL EXAM:  Vital signs:  Blood pressure is 125/86, pulse 56, respirations 20, temp 97.7, weight 163 pounds. HEENT:  Conjunctivae pink.  Sclerae anicteric.  Oropharynx is benign without oral mucositis or candidosis.  Lungs:  Clear to auscultation without wheezing or rhonchi.  Heart:  Regular rate and rhythm without murmurs, rubs, gallops, or clicks.  Abdomen:  Soft, nontender.  Normal bowel sounds are present.  Extremities:  Benign without pedal edema.  Neurologic:  Exam is nonfocal.  LABORATORY DATA:  Hemoglobin 15.7 g, platelet count 81,000, WBC 3600 with an ANC of 2100.  PT/INR from today is pending.  IMPRESSION: 62. A 60 year old [place], [place]  gentleman with a history of a     glioblastoma multiforme due for next q.2-week dose of Avastin 10     mg/kg, Temodar to be dosed. 2. History of left lower extremity deep venous thrombosis with history     of pulmonary embolus.  PT/INR from today is pending. 3. Thrombocytopenia.  Case reviewed with Dr. Humphrey Rolls.  PLAN:  Roger Raymond will continue to hold his Temodar.  We will regroup in 2 weeks' time from not regards with a repeat CBC, PT/INR and urine for protein, along with his next dose of Avastin.  He will receive Avastin today.  He understands and agrees with this plan.   ______________________________ Roger Junes, PA CS/MEDQ  D:  06/10/2011  T:  06/10/2011  Job:  [id number]

## 2011-06-10 NOTE — Progress Notes (Signed)
Progress note has been dictated-CTS

## 2011-06-12 ENCOUNTER — Telehealth: Payer: Self-pay | Admitting: *Deleted

## 2011-06-12 NOTE — Telephone Encounter (Signed)
CALLED PATIENT AT TO INFORM THE PATIENT OF THE NEW TIME OF THE APPOINTMENT ON 08-05-2011

## 2011-06-14 NOTE — Progress Notes (Deleted)
DIAGNOSIS:  A 60 year old [place], [place] gentleman with history of glioblastoma multiforme on Avastin q.2 weeks, Temodar dosed on days 1 through 5 and a history of a left lower extremity deep venous thrombosis with prior history of pulmonary embolus on Coumadin 12.5 mg p.o. daily.  SUBJECTIVE:  Roger Raymond is seen today with his wife in accompaniment prior to his next q.2-week dose of Avastin.  Of note, he would be due to resume his Temodar dosing in the near future.  He continues on Coumadin at 12.5 mg per day.  He feels well denying any fevers, chills, night sweats, shortness of breath, chest pain.  No nausea, emesis, diarrhea, or constipation issues.  He has had occasional cramping of his left calf but none over the past 2 weeks.  He has occasional nose bleeds occur when he blows his nose but no gingival bleeding, no hemoptysis, no hematochezia or hematuria.  Energy level is quite good.  Of note, he will be traveling to [place] for 3 months in January 2013.  He has located an oncologist that specializes in neuro-oncology in [place] by the name of Willette Alma.  I am sorry, I do not know the exact name or address.  ALLERGIES:  Are as reviewed per EMR.  ECOG STATUS:  Zero.  MEDICATION LIST:  Has been reviewed.  OBJECTIVE/PHYSICAL EXAM:  Vital signs:  Blood pressure is 125/86, pulse 56, respirations 20, temp 97.7, weight 163 pounds. HEENT:  Conjunctivae pink.  Sclerae anicteric.  Oropharynx is benign without oral mucositis or candidosis.  Lungs:  Clear to auscultation without wheezing or rhonchi.  Heart:  Regular rate and rhythm without murmurs, rubs, gallops, or clicks.  Abdomen:  Soft, nontender.  Normal bowel sounds are present.  Extremities:  Benign without pedal edema.  Neurologic:  Exam is nonfocal.  LABORATORY DATA:  Hemoglobin 15.7 g, platelet count 81,000, WBC 3600 with an ANC of 2100.  PT/INR from today is pending.  IMPRESSION: 23. A 60 year old [place], [place]  gentleman with a history of a     glioblastoma multiforme due for next q.2-week dose of Avastin 10     mg/kg, Temodar to be dosed. 2. History of left lower extremity deep venous thrombosis with history     of pulmonary embolus.  PT/INR from today is pending. 3. Thrombocytopenia.  Case reviewed with Dr. Humphrey Rolls.  PLAN:  [name] will continue to hold his Temodar.  We will regroup in 2 weeks' time from not regards with a repeat CBC, PT/INR and urine for protein, along with his next dose of Avastin.  He will receive Avastin today.  He understands and agrees with this plan.   ______________________________ Honor Junes, PA CS/MEDQ  D:  06/10/2011  T:  06/10/2011  Job:  [id number]

## 2011-06-17 NOTE — Progress Notes (Signed)
DIAGNOSIS:  A 59-year-old Roger Raymond, Roger Raymond gentleman with history of glioblastoma multiforme on Avastin q.2 weeks, Temodar dosed on days 1 through 5 and a history of a left lower extremity deep venous thrombosis with prior history of pulmonary embolus on Coumadin 12.5 mg p.o. daily.  SUBJECTIVE:  Roger Raymond is seen today with his wife in accompaniment prior to his next q.2-week dose of Avastin.  Of note, he would be due to resume his Temodar dosing in the near future.  He continues on Coumadin at 12.5 mg per day.  He feels well denying any fevers, chills, night sweats, shortness of breath, chest pain.  No nausea, emesis, diarrhea, or constipation issues.  He has had occasional cramping of his left calf but none over the past 2 weeks.  He has occasional nose bleeds occur when he blows his nose but no gingival bleeding, no hemoptysis, no hematochezia or hematuria.  Energy level is quite good.  Of note, he will be traveling to [place] for 3 months in January 2013.  He has located an oncologist that specializes in neuro-oncology in [place] by the name of Roger Raymond.  I am sorry, I do not know the exact name or address.  ALLERGIES:  Are as reviewed per EMR.  ECOG STATUS:  Zero.  MEDICATION LIST:  Has been reviewed.  OBJECTIVE/PHYSICAL EXAM:  Vital signs:  Blood pressure is 125/86, pulse 56, respirations 20, temp 97.7, weight 163 pounds. HEENT:  Conjunctivae pink.  Sclerae anicteric.  Oropharynx is benign without oral mucositis or candidosis.  Lungs:  Clear to auscultation without wheezing or rhonchi.  Heart:  Regular rate and rhythm without murmurs, rubs, gallops, or clicks.  Abdomen:  Soft, nontender.  Normal bowel sounds are present.  Extremities:  Benign without pedal edema.  Neurologic:  Exam is nonfocal.  LABORATORY DATA:  Hemoglobin 15.7 g, platelet count 81,000, WBC 3600 with an ANC of 2100.  PT/INR from today is pending.  IMPRESSION: 1. A 59-year-old Roger Raymond, Roger Raymond  gentleman with a history of a     glioblastoma multiforme due for next q.2-week dose of Avastin 10     mg/kg, Temodar to be dosed. 2. History of left lower extremity deep venous thrombosis with history     of pulmonary embolus.  PT/INR from today is pending. 3. Thrombocytopenia.  Case reviewed with Dr. Khan.  PLAN:  Roger Raymond will continue to hold his Temodar.  We will regroup in 2 weeks' time from not regards with a repeat CBC, PT/INR and urine for protein, along with his next dose of Avastin.  He will receive Avastin today.  He understands and agrees with this plan.   ______________________________ Roger Gear, PA CS/MEDQ  D:  06/10/2011  T:  06/10/2011  Job:  [id number] 

## 2011-06-20 ENCOUNTER — Telehealth: Payer: Self-pay | Admitting: *Deleted

## 2011-06-20 NOTE — Telephone Encounter (Signed)
Per MD pt notified to continue Coumadin 12.5mg  daily. Recheck labs as scheduled. Pt verbalized understanding

## 2011-06-24 ENCOUNTER — Other Ambulatory Visit: Payer: Self-pay | Admitting: Oncology

## 2011-06-24 ENCOUNTER — Telehealth: Payer: Self-pay | Admitting: *Deleted

## 2011-06-24 ENCOUNTER — Ambulatory Visit (HOSPITAL_BASED_OUTPATIENT_CLINIC_OR_DEPARTMENT_OTHER): Payer: 59 | Admitting: Physician Assistant

## 2011-06-24 ENCOUNTER — Other Ambulatory Visit (HOSPITAL_BASED_OUTPATIENT_CLINIC_OR_DEPARTMENT_OTHER): Payer: 59 | Admitting: Lab

## 2011-06-24 ENCOUNTER — Ambulatory Visit (HOSPITAL_BASED_OUTPATIENT_CLINIC_OR_DEPARTMENT_OTHER): Payer: 59

## 2011-06-24 ENCOUNTER — Other Ambulatory Visit: Payer: Self-pay

## 2011-06-24 VITALS — BP 133/86 | HR 56 | Temp 97.5°F

## 2011-06-24 VITALS — BP 142/88 | HR 54 | Temp 97.9°F | Ht 71.0 in | Wt 165.8 lb

## 2011-06-24 DIAGNOSIS — C711 Malignant neoplasm of frontal lobe: Secondary | ICD-10-CM

## 2011-06-24 DIAGNOSIS — Z86711 Personal history of pulmonary embolism: Secondary | ICD-10-CM

## 2011-06-24 DIAGNOSIS — Z86718 Personal history of other venous thrombosis and embolism: Secondary | ICD-10-CM

## 2011-06-24 DIAGNOSIS — Z5112 Encounter for antineoplastic immunotherapy: Secondary | ICD-10-CM

## 2011-06-24 LAB — CBC WITH DIFFERENTIAL/PLATELET
BASO%: 0.3 % (ref 0.0–2.0)
Basophils Absolute: 0 10*3/uL (ref 0.0–0.1)
EOS%: 2.7 % (ref 0.0–7.0)
HCT: 42.5 % (ref 38.4–49.9)
HGB: 14.8 g/dL (ref 13.0–17.1)
LYMPH%: 32.2 % (ref 14.0–49.0)
MCH: 33.6 pg — ABNORMAL HIGH (ref 27.2–33.4)
MCHC: 34.8 g/dL (ref 32.0–36.0)
MCV: 96.6 fL (ref 79.3–98.0)
MONO%: 7.5 % (ref 0.0–14.0)
NEUT%: 57.3 % (ref 39.0–75.0)
Platelets: 188 10*3/uL (ref 140–400)
lymph#: 1 10*3/uL (ref 0.9–3.3)

## 2011-06-24 LAB — PROTIME-INR

## 2011-06-24 LAB — COMPREHENSIVE METABOLIC PANEL
ALT: 55 U/L — ABNORMAL HIGH (ref 0–53)
AST: 53 U/L — ABNORMAL HIGH (ref 0–37)
Albumin: 4.1 g/dL (ref 3.5–5.2)
BUN: 13 mg/dL (ref 6–23)
CO2: 27 mEq/L (ref 19–32)
Calcium: 8.9 mg/dL (ref 8.4–10.5)
Chloride: 104 mEq/L (ref 96–112)
Potassium: 4.2 mEq/L (ref 3.5–5.3)

## 2011-06-24 NOTE — Progress Notes (Signed)
DIAGNOSES: 13. A 60 year old [place], [place], gentleman with a history of     glioblastoma multiforme on Avastin q.2 week and Temodar dosed on     days 1 through 5. 2. History of left lower extremity deep venous thrombosis with a     history of pulmonary emboli on Coumadin 12.5 mg p.o. daily.  SUBJECTIVE:  Roger Raymond is seen today with his wife and son in accompaniment prior to his next q.2 week dose of Avastin.  Of note, we held his Temodar 2 weeks ago due to a platelet count of 81,000.  He really feels quite well, he does note though over the past couple of weeks that he has had nose bleeds in the mornings.  They are spontaneous, from both nares, and resolve anywhere from a matter of 20 minutes to about 2 hours.  He denies any other bleeding, i.e., gingival bleeding, hemoptysis, hematochezia or hematuria, abnormal bruising symptoms.  He continues on his current Coumadin dose but wonders about possible dose reduction.  Otherwise, no fevers, chills, shortness of breath or chest pain.  No nausea, emesis, diarrhea, constipation issues. No headaches or vision changes.  No seizure activity.  REVIEW OF SYSTEMS:  Otherwise negative.  ALLERGIES:  No known drug allergies.  CURRENT MEDICATIONS:  As per EMR.  ECOG STATUS:  Zero.  PHYSICAL EXAMINATION:  Vital signs:  Blood pressure 142/88, pulse 54, respirations 20, temp 97.9, weight 165 pounds.  HEENT:  Conjunctivae pink.  Oropharynx is benign without mucositis or candidosis.  Both nares were examined.  No evidence of prior or active bleeding is appreciated. Lungs:  Clear to auscultation without wheezing or rhonchi.  No vertebral tenderness with gentle percussion.  Heart:  Essentially slightly bradycardic rate but normal rhythm without murmurs, rubs, gallops or clicks.  Abdomen:  Soft and nontender without organomegaly.  Normal bowel sounds.  Extremities:  Are free of pedal edema.  Neurologic:  Exam is nonfocal.  The patient is alert  and oriented times 3.  LABORATORY DATA:  Hemoglobin 14.8 g, platelet count 188,000, WBC 3000 with an ANC of 1700.  PT/INR 34.8/2.9 respectively.  Urine for protein is negative.  IMPRESSION: 68. A 60 year old [place], [place], gentleman with glioblastoma     multiforme due for next q.2 week dose of Avastin at 10 mg/kg.     Prior Temodar was held 2 weeks ago due to thrombocytopenia which     has resolved. 2. History of left lower extremity deep venous thrombosis and history     of pulmonary embolus on Coumadin with therapeutic INR. 3. Epistaxis in the morning. 4. Mild hypertension. 5. Case reviewed with Dr. Humphrey Rolls.  PLAN:  From a treatment standpoint today, we will recheck his blood pressure when he presents to the treatment area, since his blood pressures have been very normal we will check this before possibly dosing with Norvasc 5 mg.  He will receive his Avastin.  He will also resume his Temodar.  From the PT/INR standpoint, I will have him take 10 mg of Coumadin on Monday, Wednesday, Friday and 12.5 mg other days with a repeat PT/INR at his local hospital on 06/24/2011, results to be faxed to [place].  We will see him in 2 weeks' time prior to his next Avastin dosing.  He and his wife understand and agree with this plan.    ______________________________ Honor Junes, PA CS/MEDQ  D:  06/24/2011  T:  06/24/2011  Job:  [id number]

## 2011-06-24 NOTE — Progress Notes (Signed)
06/24/11- Order to recheck BP after pt is called back to infusion room.  Recheck of BP in infusion room is 135/87 HR 56.  Debbora Presto PA made aware and okay to proceed with treatment today.  06/24/11- 1240 Avastin 725mg  verified by Vincent Peyer RN started IVPB to infuse over 20 minutes.                   1300- Avastin infusion complete.  Port flushed per protocol and deaccessed without difficulty.  Bandaid applied to port site.                  1315- Pt d/c ambulatory with next appointment confirmed.

## 2011-06-24 NOTE — Patient Instructions (Signed)
1310Pt discharged ambulatory with next appointment confirmed.  Pt aware to call with any questions or concerns.

## 2011-06-24 NOTE — Progress Notes (Signed)
Progress note dictated-CTS 

## 2011-06-24 NOTE — Telephone Encounter (Signed)
gave patient appointments for 07-08-2011 and 07-22-2011

## 2011-06-25 ENCOUNTER — Other Ambulatory Visit: Payer: Self-pay | Admitting: Oncology

## 2011-07-04 ENCOUNTER — Telehealth: Payer: Self-pay | Admitting: *Deleted

## 2011-07-04 NOTE — Telephone Encounter (Signed)
Attempt to call pt.

## 2011-07-04 NOTE — Telephone Encounter (Signed)
Notified pt that per MD, pt is to keep same dose of coumadin 12.5 mon, wed, fri alternating with 12.5. Pt verbalized understanding

## 2011-07-08 ENCOUNTER — Other Ambulatory Visit: Payer: Self-pay | Admitting: Oncology

## 2011-07-08 ENCOUNTER — Ambulatory Visit (HOSPITAL_BASED_OUTPATIENT_CLINIC_OR_DEPARTMENT_OTHER): Payer: 59 | Admitting: Oncology

## 2011-07-08 ENCOUNTER — Encounter: Payer: Self-pay | Admitting: Oncology

## 2011-07-08 ENCOUNTER — Ambulatory Visit: Payer: 59 | Admitting: Physician Assistant

## 2011-07-08 ENCOUNTER — Ambulatory Visit (HOSPITAL_BASED_OUTPATIENT_CLINIC_OR_DEPARTMENT_OTHER): Payer: 59

## 2011-07-08 ENCOUNTER — Other Ambulatory Visit (HOSPITAL_BASED_OUTPATIENT_CLINIC_OR_DEPARTMENT_OTHER): Payer: 59

## 2011-07-08 DIAGNOSIS — C711 Malignant neoplasm of frontal lobe: Secondary | ICD-10-CM

## 2011-07-08 DIAGNOSIS — Z86718 Personal history of other venous thrombosis and embolism: Secondary | ICD-10-CM

## 2011-07-08 DIAGNOSIS — I82409 Acute embolism and thrombosis of unspecified deep veins of unspecified lower extremity: Secondary | ICD-10-CM

## 2011-07-08 DIAGNOSIS — I2699 Other pulmonary embolism without acute cor pulmonale: Secondary | ICD-10-CM

## 2011-07-08 DIAGNOSIS — C719 Malignant neoplasm of brain, unspecified: Secondary | ICD-10-CM

## 2011-07-08 DIAGNOSIS — Z7901 Long term (current) use of anticoagulants: Secondary | ICD-10-CM

## 2011-07-08 DIAGNOSIS — Z09 Encounter for follow-up examination after completed treatment for conditions other than malignant neoplasm: Secondary | ICD-10-CM

## 2011-07-08 DIAGNOSIS — Z5112 Encounter for antineoplastic immunotherapy: Secondary | ICD-10-CM

## 2011-07-08 HISTORY — DX: Acute embolism and thrombosis of unspecified deep veins of unspecified lower extremity: I82.409

## 2011-07-08 LAB — CBC WITH DIFFERENTIAL/PLATELET
Basophils Absolute: 0 10*3/uL (ref 0.0–0.1)
EOS%: 2 % (ref 0.0–7.0)
HCT: 41.6 % (ref 38.4–49.9)
HGB: 14.6 g/dL (ref 13.0–17.1)
MCH: 33.9 pg — ABNORMAL HIGH (ref 27.2–33.4)
MCV: 96.5 fL (ref 79.3–98.0)
MONO%: 10.1 % (ref 0.0–14.0)
NEUT%: 67.4 % (ref 39.0–75.0)

## 2011-07-08 LAB — COMPREHENSIVE METABOLIC PANEL
Albumin: 3.8 g/dL (ref 3.5–5.2)
Alkaline Phosphatase: 140 U/L — ABNORMAL HIGH (ref 39–117)
CO2: 25 mEq/L (ref 19–32)
Calcium: 9.2 mg/dL (ref 8.4–10.5)
Chloride: 104 mEq/L (ref 96–112)
Glucose, Bld: 101 mg/dL — ABNORMAL HIGH (ref 70–99)
Potassium: 4.4 mEq/L (ref 3.5–5.3)
Sodium: 139 mEq/L (ref 135–145)
Total Protein: 6.4 g/dL (ref 6.0–8.3)

## 2011-07-08 MED ORDER — SODIUM CHLORIDE 0.9 % IV SOLN
10.0000 mg/kg | Freq: Once | INTRAVENOUS | Status: AC
  Administered 2011-07-08: 725 mg via INTRAVENOUS
  Filled 2011-07-08: qty 29

## 2011-07-08 MED ORDER — SODIUM CHLORIDE 0.9 % IV SOLN: Freq: Once | INTRAVENOUS | Status: DC

## 2011-07-08 MED ORDER — HEPARIN SOD (PORK) LOCK FLUSH 100 UNIT/ML IV SOLN
500.0000 [IU] | Freq: Once | INTRAVENOUS | Status: AC | PRN
  Administered 2011-07-08: 500 [IU]
  Filled 2011-07-08: qty 5

## 2011-07-08 MED ORDER — SODIUM CHLORIDE 0.9 % IJ SOLN
10.0000 mL | INTRAMUSCULAR | Status: DC | PRN
  Administered 2011-07-08: 10 mL
  Filled 2011-07-08: qty 10

## 2011-07-08 NOTE — Patient Instructions (Addendum)
Bevacizumab injection What is this medicine? BEVACIZUMAB (be va SIZ yoo mab) is a chemotherapy drug. It targets a protein found in many cancer cell types, and halts cancer growth. This drug treats many cancers including non-small cell lung cancer, and colon or rectal cancer. It is usually given with other chemotherapy drugs. This medicine may be used for other purposes; ask your health care provider or pharmacist if you have questions. What should I tell my health care provider before I take this medicine? They need to know if you have any of these conditions: -blood clots -heart disease, including heart failure, heart attack, or chest pain (angina) -high blood pressure -infection (especially a virus infection such as chickenpox, cold sores, or herpes) -kidney disease -lung disease -prior chemotherapy with doxorubicin, daunorubicin, epirubicin, or other anthracycline type chemotherapy agents -recent or ongoing radiation therapy -recent surgery -stroke -an unusual or allergic reaction to bevacizumab, hamster proteins, mouse proteins, other medicines, foods, dyes, or preservatives -pregnant or trying to get pregnant -breast-feeding How should I use this medicine? This medicine is for infusion into a vein. It is given by a health care professional in a hospital or clinic setting. Talk to your pediatrician regarding the use of this medicine in children. Special care may be needed. Overdosage: If you think you have taken too much of this medicine contact a poison control center or emergency room at once. NOTE: This medicine is only for you. Do not share this medicine with others. What if I miss a dose? It is important not to miss your dose. Call your doctor or health care professional if you are unable to keep an appointment. What may interact with this medicine? Interactions are not expected. This list may not describe all possible interactions. Give your health care provider a list of all  the medicines, herbs, non-prescription drugs, or dietary supplements you use. Also tell them if you smoke, drink alcohol, or use illegal drugs. Some items may interact with your medicine. What should I watch for while using this medicine? Your condition will be monitored carefully while you are receiving this medicine. You will need important blood work and urine testing done while you are taking this medicine. During your treatment, let your health care professional know if you have any unusual symptoms, such as difficulty breathing. This medicine may rarely cause 'gastrointestinal perforation' (holes in the stomach, intestines or colon), a serious side effect requiring surgery to repair. This medicine should be started at least 28 days following major surgery and the site of the surgery should be totally healed. Check with your doctor before scheduling dental work or surgery while you are receiving this treatment. Talk to your doctor if you have recently had surgery or if you have a wound that has not healed. Do not become pregnant while taking this medicine. Women should inform their doctor if they wish to become pregnant or think they might be pregnant. There is a potential for serious side effects to an unborn child. Talk to your health care professional or pharmacist for more information. Do not breast-feed an infant while taking this medicine. This medicine has caused ovarian failure in some women. This medicine may interfere with the ability to have a child. You should talk to your doctor or health care professional if you are concerned about your fertility. What side effects may I notice from receiving this medicine? Side effects that you should report to your doctor or health care professional as soon as possible: -allergic reactions like skin   rash, itching or hives, swelling of the face, lips, or tongue -signs of infection - fever or chills, cough, sore throat, pain or trouble passing  urine -signs of decreased platelets or bleeding - bruising, pinpoint red spots on the skin, black, tarry stools, nosebleeds, blood in the urine -breathing problems -changes in vision -chest pain -confusion -jaw pain, especially after dental work -mouth sores -seizures -severe abdominal pain -severe headache -sudden numbness or weakness of the face, arm or leg -swelling of legs or ankles -symptoms of a stroke: change in mental awareness, inability to talk or move one side of the body (especially in patients with lung cancer) -trouble passing urine or change in the amount of urine -trouble speaking or understanding -trouble walking, dizziness, loss of balance or coordination Side effects that usually do not require medical attention (report to your doctor or health care professional if they continue or are bothersome): -constipation -diarrhea -dry skin -headache -loss of appetite -nausea, vomiting This list may not describe all possible side effects. Call your doctor for medical advice about side effects. You may report side effects to FDA at 1-800-FDA-1088. Where should I keep my medicine? This drug is given in a hospital or clinic and will not be stored at home. NOTE: This sheet is a summary. It may not cover all possible information. If you have questions about this medicine, talk to your doctor, pharmacist, or health care provider.  2012, Elsevier/Gold Standard. (06/22/2010 4:25:37 PM)   Lab Results  Component Value Date   WBC 4.0 07/08/2011   HGB 14.6 07/08/2011   HCT 41.6 07/08/2011   MCV 96.5 07/08/2011   PLT 214 07/08/2011     Chemistry      Component Value Date/Time   NA 139 06/24/2011 0910   K 4.2 06/24/2011 0910   CL 104 06/24/2011 0910   CO2 27 06/24/2011 0910   BUN 13 06/24/2011 0910   CREATININE 0.91 06/24/2011 0910      Component Value Date/Time   CALCIUM 8.9 06/24/2011 0910   ALKPHOS 122* 06/24/2011 0910   AST 53* 06/24/2011 0910   ALT 55* 06/24/2011  0910   BILITOT 0.4 06/24/2011 0910      INR: 3.2

## 2011-07-12 NOTE — Progress Notes (Signed)
OFFICE PROGRESS NOTE  Dr. 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 The tumor chemotherapy consisted of Temodar 75 mg per meter squared.  #2 patient was then started on Temodar given days 1 through 5 every 28 days with concomitant Avastin given every 2 weeks.  #3 patient also developed lower extremity DVT as well as as pulmonary embolism he was started on Coumadin he is maintained on this at 12.5 mg daily to try to maintain an INR between 2 and 2.5.  CURRENT THERAPY: Patient is here for his scheduled Avastin.  INTERVAL HISTORY: Roger Raymond 60 y.o. male returns for followup visit today for his treatment. Overall he is doing well he is in good spirits. He tells me that he will be going to [place] for about 3 months time. There he is going to be continuing his treatments but will haven't by a local oncologist. He did give [place] some names and we will try to get in touch with the oncologist. He has been overall feeling well he is exercising eating healthy maintaining his weight. He denies any fevers chills night sweats headaches shortness of breath chest pains palpitations no myalgias or arthralgias. He has no hematuria hematochezia melena hemoptysis or hematemesis. He has no abdominal pain. Remainder of the 10 point review of systems is negative.  MEDICAL HISTORY: Past Medical History  Diagnosis Date  . 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 DVT (deep venous thrombosis) 07/08/2011    ALLERGIES:   has no known allergies.  MEDICATIONS:  Current Outpatient Prescriptions  Medication Sig Dispense Refill  . acetaminophen (TYLENOL)  325 MG tablet Take 650 mg by mouth every 6 (six) hours as needed.        . chlorproMAZINE (THORAZINE) 25 MG tablet Take 25 mg by mouth every 6 (six) hours as needed.        . lidocaine-prilocaine (EMLA) cream Apply topically as needed. Apply to port 1-2 hours before procedure       . warfarin (COUMADIN) 10 MG tablet Take 10 mg by mouth daily. 10mg  MWF, 12.5 all others   04/15/11       . warfarin (COUMADIN) 5 MG tablet TAKE 2 TABS EVERY MONDAY, WEDNESDAY, AND FRIDAY. TAKE 2&1/2 TABS ALL OTHER DAYS OR AS DIRECTED  64 tablet  1  . aspirin 325 MG tablet Take 325 mg by mouth daily.        . diphenoxylate-atropine (LOMOTIL) 2.5-0.025 MG per tablet Take 1 tablet by mouth 4 (four) times daily as needed. 1-2 tabs prn       . famotidine (PEPCID) 20 MG tablet Take 20 mg by mouth as needed.        . levETIRAcetam (KEPPRA) 500 MG tablet Take 500 mg by mouth 2 (two) times daily.        [name] omega-3 acid ethyl esters (LOVAZA) 1 G capsule Take 1 g by mouth 2 (two) times daily. No dose listed       . ondansetron (ZOFRAN) 8 MG tablet Take by mouth every 12 (twelve) hours as needed.        . prochlorperazine (COMPAZINE) 10 MG tablet Take  10 mg by mouth every 6 (six) hours as needed.        . temozolomide (TEMODAR) 180 MG capsule Take 360 mg by mouth daily. May take on an empty stomach or at bedtime to decrease nausea & vomiting.         SURGICAL HISTORY:  Past Surgical History  Procedure Date  . Vasectomy     REVIEW OF SYSTEMS:  Pertinent items are noted in HPI.   PHYSICAL EXAMINATION: General appearance: alert, cooperative and appears stated age Head: Normocephalic, without obvious abnormality, atraumatic Neck: no adenopathy, no carotid bruit, no JVD, supple, symmetrical, trachea midline and thyroid not enlarged, symmetric, no tenderness/mass/nodules Lymph nodes: Cervical, supraclavicular, and axillary nodes normal. Resp: clear to auscultation bilaterally and normal percussion bilaterally Back: symmetric, no  curvature. ROM normal. No CVA tenderness. Cardio: regular rate and rhythm, S1, S2 normal, no murmur, click, rub or gallop and normal apical impulse GI: soft, non-tender; bowel sounds normal; no masses,  no organomegaly Extremities: extremities normal, atraumatic, no cyanosis or edema Neurologic: Alert and oriented X 3, normal strength and tone. Normal symmetric reflexes. Normal coordination and gait  ECOG PERFORMANCE STATUS: 0 - Asymptomatic  Blood pressure 120/80, pulse 67, temperature 98.3 F (36.8 C), temperature source Oral, height 5\' 11"  (1.803 m), weight 164 lb (74.39 kg).  LABORATORY DATA: Lab Results  Component Value Date   WBC 4.0 07/08/2011   HGB 14.6 07/08/2011   HCT 41.6 07/08/2011   MCV 96.5 07/08/2011   PLT 214 07/08/2011      Chemistry      Component Value Date/Time   NA 139 07/08/2011 1408   K 4.4 07/08/2011 1408   CL 104 07/08/2011 1408   CO2 25 07/08/2011 1408   BUN 19 07/08/2011 1408   CREATININE 0.92 07/08/2011 1408      Component Value Date/Time   CALCIUM 9.2 07/08/2011 1408   ALKPHOS 140* 07/08/2011 1408   AST 37 07/08/2011 1408   ALT 48 07/08/2011 1408   BILITOT 0.3 07/08/2011 1408       RADIOGRAPHIC STUDIES:  No results found.  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 Overall he is doing well and tolerating his therapy quite well. He also is getting  Avastin and tolerating it very well. For his pulmonary embolus he is on Coumadin and he is very therapeutic with his INR. We are checking his INR is at [facility] with the results being faxed to [place] here at [facility].   PLAN: We will proceed with scheduled Avastin. He will keep the same dose of the Coumadin at this point since his INR is therapeutic. He will return in 2 weeks' time for followup and next dose of Avastin   All questions were answered. The patient knows to call the clinic with any problems, questions  or concerns. We can certainly see the patient much sooner if necessary.  I spent 20 minutes counseling the patient face to face. The total time spent in the appointment was 30 minutes.    Marcy Panning, MD Medical/Oncology [facility] [phone] (beeper) [phone] (Office)  07/12/2011, 4:48 PM

## 2011-07-15 ENCOUNTER — Telehealth: Payer: Self-pay | Admitting: *Deleted

## 2011-07-15 NOTE — Telephone Encounter (Signed)
Notified pt per MD, pt to continue same dose of Coumadin 12.5mg  alternating days, recheck labs as scheduled. Pt verbalized understanding.

## 2011-07-22 ENCOUNTER — Ambulatory Visit (HOSPITAL_BASED_OUTPATIENT_CLINIC_OR_DEPARTMENT_OTHER): Payer: 59

## 2011-07-22 ENCOUNTER — Other Ambulatory Visit (HOSPITAL_BASED_OUTPATIENT_CLINIC_OR_DEPARTMENT_OTHER): Payer: 59 | Admitting: Lab

## 2011-07-22 ENCOUNTER — Ambulatory Visit (HOSPITAL_BASED_OUTPATIENT_CLINIC_OR_DEPARTMENT_OTHER): Payer: 59 | Admitting: Physician Assistant

## 2011-07-22 ENCOUNTER — Telehealth: Payer: Self-pay | Admitting: *Deleted

## 2011-07-22 DIAGNOSIS — C719 Malignant neoplasm of brain, unspecified: Secondary | ICD-10-CM

## 2011-07-22 DIAGNOSIS — D6959 Other secondary thrombocytopenia: Secondary | ICD-10-CM

## 2011-07-22 DIAGNOSIS — Z86711 Personal history of pulmonary embolism: Secondary | ICD-10-CM

## 2011-07-22 DIAGNOSIS — C711 Malignant neoplasm of frontal lobe: Secondary | ICD-10-CM

## 2011-07-22 DIAGNOSIS — Z86718 Personal history of other venous thrombosis and embolism: Secondary | ICD-10-CM

## 2011-07-22 DIAGNOSIS — Z5181 Encounter for therapeutic drug level monitoring: Secondary | ICD-10-CM

## 2011-07-22 DIAGNOSIS — Z5112 Encounter for antineoplastic immunotherapy: Secondary | ICD-10-CM

## 2011-07-22 DIAGNOSIS — I2699 Other pulmonary embolism without acute cor pulmonale: Secondary | ICD-10-CM

## 2011-07-22 DIAGNOSIS — I82402 Acute embolism and thrombosis of unspecified deep veins of left lower extremity: Secondary | ICD-10-CM

## 2011-07-22 LAB — CBC WITH DIFFERENTIAL/PLATELET
EOS%: 3.9 % (ref 0.0–7.0)
Eosinophils Absolute: 0.2 10*3/uL (ref 0.0–0.5)
MCV: 97.5 fL (ref 79.3–98.0)
MONO%: 8.4 % (ref 0.0–14.0)
NEUT#: 2.3 10*3/uL (ref 1.5–6.5)
RBC: 4.34 10*6/uL (ref 4.20–5.82)
RDW: 13 % (ref 11.0–14.6)
nRBC: 0 % (ref 0–0)

## 2011-07-22 LAB — COMPREHENSIVE METABOLIC PANEL
ALT: 53 U/L (ref 0–53)
Alkaline Phosphatase: 130 U/L — ABNORMAL HIGH (ref 39–117)
CO2: 26 mEq/L (ref 19–32)
Sodium: 140 mEq/L (ref 135–145)
Total Bilirubin: 0.3 mg/dL (ref 0.3–1.2)
Total Protein: 6.9 g/dL (ref 6.0–8.3)

## 2011-07-22 LAB — UA PROTEIN, DIPSTICK - CHCC: Protein, Urine: NEGATIVE mg/dL

## 2011-07-22 MED ORDER — SODIUM CHLORIDE 0.9 % IJ SOLN
10.0000 mL | INTRAMUSCULAR | Status: DC | PRN
  Administered 2011-07-22 (×2): 10 mL
  Filled 2011-07-22: qty 10

## 2011-07-22 MED ORDER — SODIUM CHLORIDE 0.9 % IV SOLN
10.0000 mg/kg | Freq: Once | INTRAVENOUS | Status: AC
  Administered 2011-07-22: 725 mg via INTRAVENOUS
  Filled 2011-07-22: qty 29

## 2011-07-22 MED ORDER — HEPARIN SOD (PORK) LOCK FLUSH 100 UNIT/ML IV SOLN
500.0000 [IU] | Freq: Once | INTRAVENOUS | Status: AC | PRN
  Administered 2011-07-22: 500 [IU]
  Filled 2011-07-22: qty 5

## 2011-07-22 MED ORDER — SODIUM CHLORIDE 0.9 % IV SOLN: Freq: Once | INTRAVENOUS | Status: DC

## 2011-07-22 NOTE — Progress Notes (Signed)
CC:   Roger Raymond, M.D.  DIAGNOSIS:  A 60 year old [place], [place], gentleman with: 1. History of glioblastoma multiforme on q.2-week Avastin; he was also     dosed on Temodar days 1 through 5 on a 28-day regimen, though     currently being held. 2. History of left lower extremity deep venous thrombosis with history     of pulmonary emboli, on Coumadin 12.5 mg p.o. daily.  CURRENT THERAPY: 1. Avastin 10 mg/kg on a q.2-week basis; prior Temodar dosing on days     1 through 5. 2. Coumadin 12.5 mg p.o. daily.  SUBJECTIVE:  Roger Raymond is seen today with his wife in accompaniment for followup prior to his next scheduled Avastin dosing.  He is also due to initiate Temodar, but notes that his platelet count is low, with his platelets reported at 83,000.  He himself though voices no complaints, i.e., no fevers, chills, night sweats, shortness of breath, chest pain. No nausea, emesis, diarrhea, or constipation issues.  No unusual bleeding or bruising symptoms.  No headaches or vision changes.  ALLERGIES:  No known drug allergies.  CURRENT MEDICATIONS:  As per EMR.  ECOG STATUS:  One.  PHYSICAL EXAMINATION:  Vital Signs:  Blood pressure is 134/86, pulse 60, respirations 20, temp 97.4, weight 166 pounds.  HEENT:  Conjunctivae pink.  Sclerae anicteric.  Oropharynx is benign without oral mucositis or candidiasis.  No cervical or supraclavicular lymphadenopathy is present on exam.  Lungs:  Clear to auscultation without wheezing or rhonchi.  Heart:  Regular rate and rhythm without murmurs, rubs, gallops, or clicks.  Abdomen:  Soft, nontender, without organomegaly. Normal bowel sounds.  Extremities:  Benign.  Neurologic:  Exam is nonfocal.  The patient is alert and oriented x3.  LABORATORY DATA:  Hemoglobin 14.6 grams, platelet count 83,000, WBC 3800 with an ANC of 2300.  PT/INR 31.2/2.6 respectively.  Urine for protein is negative.  IMPRESSION:  A 60 year old [place], [place], gentleman with: 1. Glioblastoma multiforme on q.2-week Avastin.  Temodar has been     dosed on days 1 through 5 on a q.28-day regimen, but due to the     fact his platelets are 83,000, we will hold for the next 2 weeks. 2. History of left lower extremity DVT with pulmonary emboli, on     Coumadin with therapeutic INR. The case has been reviewed with Dr. Humphrey Rolls.  PLAN:  Roger Raymond will receive his Avastin today as scheduled.  He will continue to hold his Temodar.  No dosage adjustments of Coumadin are necessary.  We will regroup in 2 weeks' time, though sooner if the need should arise.    ______________________________ Honor Junes, PA CS/MEDQ  D:  07/22/2011  T:  07/22/2011  Job:  [id number]

## 2011-07-22 NOTE — Telephone Encounter (Signed)
Continue same dose of coumadin and recheck labs as scheduled Requested pt call back confirming msg was rcvd.

## 2011-07-22 NOTE — Progress Notes (Signed)
Progress note dictated-CTS 

## 2011-07-23 NOTE — Telephone Encounter (Signed)
Pt confirmed he rcvd call will continue coumadin at same dose. Recheck labs as scheduled

## 2011-08-05 ENCOUNTER — Other Ambulatory Visit: Payer: Self-pay

## 2011-08-05 ENCOUNTER — Other Ambulatory Visit: Payer: Self-pay | Admitting: Oncology

## 2011-08-05 ENCOUNTER — Telehealth: Payer: Self-pay | Admitting: Oncology

## 2011-08-05 ENCOUNTER — Ambulatory Visit: Payer: 59 | Admitting: Oncology

## 2011-08-05 ENCOUNTER — Ambulatory Visit (HOSPITAL_BASED_OUTPATIENT_CLINIC_OR_DEPARTMENT_OTHER): Payer: 59

## 2011-08-05 ENCOUNTER — Ambulatory Visit (HOSPITAL_BASED_OUTPATIENT_CLINIC_OR_DEPARTMENT_OTHER): Payer: 59 | Admitting: Physician Assistant

## 2011-08-05 ENCOUNTER — Other Ambulatory Visit (HOSPITAL_BASED_OUTPATIENT_CLINIC_OR_DEPARTMENT_OTHER): Payer: 59 | Admitting: Lab

## 2011-08-05 VITALS — BP 137/85 | HR 55 | Temp 97.4°F | Ht 71.0 in | Wt 168.6 lb

## 2011-08-05 VITALS — BP 155/92 | HR 55 | Temp 97.4°F

## 2011-08-05 DIAGNOSIS — Z86718 Personal history of other venous thrombosis and embolism: Secondary | ICD-10-CM

## 2011-08-05 DIAGNOSIS — I82409 Acute embolism and thrombosis of unspecified deep veins of unspecified lower extremity: Secondary | ICD-10-CM

## 2011-08-05 DIAGNOSIS — C719 Malignant neoplasm of brain, unspecified: Secondary | ICD-10-CM

## 2011-08-05 DIAGNOSIS — Z7901 Long term (current) use of anticoagulants: Secondary | ICD-10-CM

## 2011-08-05 DIAGNOSIS — C711 Malignant neoplasm of frontal lobe: Secondary | ICD-10-CM

## 2011-08-05 DIAGNOSIS — Z86711 Personal history of pulmonary embolism: Secondary | ICD-10-CM

## 2011-08-05 DIAGNOSIS — Z5112 Encounter for antineoplastic immunotherapy: Secondary | ICD-10-CM

## 2011-08-05 LAB — CBC WITH DIFFERENTIAL/PLATELET
Basophils Absolute: 0 10*3/uL (ref 0.0–0.1)
Eosinophils Absolute: 0.1 10*3/uL (ref 0.0–0.5)
HGB: 14.7 g/dL (ref 13.0–17.1)
NEUT#: 2.7 10*3/uL (ref 1.5–6.5)
RDW: 13.4 % (ref 11.0–14.6)
lymph#: 1 10*3/uL (ref 0.9–3.3)

## 2011-08-05 LAB — UA PROTEIN, DIPSTICK - CHCC: Protein, Urine: NEGATIVE mg/dL

## 2011-08-05 LAB — PROTHROMBIN TIME
INR: 2.41 — ABNORMAL HIGH (ref <1.50)
Prothrombin Time: 26.6 s — ABNORMAL HIGH (ref 11.6–15.2)

## 2011-08-05 MED ORDER — SODIUM CHLORIDE 0.9 % IJ SOLN
10.0000 mL | INTRAMUSCULAR | Status: DC | PRN
  Administered 2011-08-05: 10 mL
  Filled 2011-08-05: qty 10

## 2011-08-05 MED ORDER — SODIUM CHLORIDE 0.9 % IV SOLN
Freq: Once | INTRAVENOUS | Status: AC
  Administered 2011-08-05: 13:00:00 via INTRAVENOUS

## 2011-08-05 MED ORDER — SODIUM CHLORIDE 0.9 % IV SOLN
10.0000 mg/kg | Freq: Once | INTRAVENOUS | Status: AC
  Administered 2011-08-05: 725 mg via INTRAVENOUS
  Filled 2011-08-05: qty 29

## 2011-08-05 MED ORDER — HEPARIN SOD (PORK) LOCK FLUSH 100 UNIT/ML IV SOLN
500.0000 [IU] | Freq: Once | INTRAVENOUS | Status: AC | PRN
  Administered 2011-08-05: 500 [IU]
  Filled 2011-08-05: qty 5

## 2011-08-05 NOTE — Progress Notes (Signed)
CC:   Hosie Spangle, M.D.  DIAGNOSES:  A 60 year old [place], [place] gentleman with: 1. History of glioblastoma multiforme on every 2 week Avastin and also     on Temodar dosed on days 1-5 on a 28-day regimen, though held 2     weeks ago due to mild thrombocytopenia. 2. History of left lower extremity deep venous thrombosis with history     of pulmonary embolus, on Coumadin 12.5 mg p.o. daily.  CURRENT THERAPY: 1. Avastin 10 mg/kg given on a q.2 week basis and Temodar dosed on     days 1-5 on a q.28 day regimen. 2. Coumadin 12.5 mg per day.  SUBJECTIVE:  Mr. Roger Raymond is seen today with his wife in accompaniment prior to his next q.2 hours week dose of Avastin.  Of note, he continues also on Coumadin at 12.5 mg per day.  He and his wife are feeling quite well.  He denies any fevers, chills, night sweats, shortness of breath, or chest pain.  No nausea, emesis, diarrhea, or constipation issues.  No headache, vision changes, or gait disturbance.  He denies any abnormal bleeding or bruising symptoms.  They have established with a medical oncologist while they are in [place] for the next 3 months to continue his ongoing Avastin.  REVIEW OF SYSTEMS:  Negative.  ALLERGIES:  No known drug allergies.  CURRENT MEDICATIONS:  As per EMR.  PERFORMANCE STATUS:  ECOG status of zero.  PHYSICAL EXAMINATION:  Vital Signs:  Blood pressure 137/85.  Pulse 55. Respirations 20.  Temp 97.4.  Weight 168 pounds.  HEENT:  Conjunctivae pink.  Sclerae anicteric.  Oropharynx is benign without oral mucositis or candidosis.  Lungs:  Clear to auscultation without wheezing or rhonchi.  Heart:  Regular rate and rhythm without murmurs, rubs, gallops, or clicks.  Abdomen:  Soft, nontender without organomegaly. Normal bowel sounds.  Extremities:  Benign and free of pedal edema. Neurologic Exam:  Nonfocal.  The patient is alert and oriented x3.  LABORATORY DATA:  Hemoglobin 14.7 g, platelet count 171,000, WBC  4,200 with an ANC of 2,700.  PT/INR and urine for protein are pending from today.  IMPRESSION: 31. A 60 year old [place], [place] gentleman with glioblastoma     multiforme due for his next every 2 week dose of maintenance     Avastin.  He also had mild thrombocytopenia which has since     resolved.  The patient can dose his Temodar on days 1-5. 2. History of left lower extremity deep venous thrombosis with     pulmonary embolus, on Coumadin, but INR are pending. The case was reviewed with Dr. Chancy Milroy.  PLAN:  As long as his protein for urine is clear, we can proceed with his Avastin dosing today as scheduled.  He will dose with his Temodar on days 1-5 at his discretion.  Since he is scheduled to establish with a medical oncologist in [place] on 08/16/2011, we will not recheck a PT/INR this coming week since he will be traveling.  We will plan on seeing him back to re-establish in April upon his return, though he and his wife know at any time in the interim they can feel free to contact us  if the need should arise.    ______________________________ Honor Junes, PA CS/MEDQ  D:  08/05/2011  T:  08/05/2011  Job:  [id number]

## 2011-08-05 NOTE — Patient Instructions (Signed)
Patient discharged home with wife; aware of next appointment.

## 2011-08-05 NOTE — Progress Notes (Signed)
Progress note dictated-CTS 

## 2011-08-05 NOTE — Telephone Encounter (Signed)
Gv pt appt for april2013 °

## 2011-08-07 ENCOUNTER — Other Ambulatory Visit: Payer: Self-pay | Admitting: Oncology

## 2011-08-07 DIAGNOSIS — I82409 Acute embolism and thrombosis of unspecified deep veins of unspecified lower extremity: Secondary | ICD-10-CM

## 2011-08-08 ENCOUNTER — Telehealth: Payer: Self-pay | Admitting: *Deleted

## 2011-08-08 NOTE — Telephone Encounter (Signed)
THIS REQUEST WAS PLACED IN THE MANAGED CARE BIN. 

## 2011-08-09 ENCOUNTER — Other Ambulatory Visit: Payer: Self-pay | Admitting: *Deleted

## 2011-08-12 MED ORDER — ONDANSETRON HCL 8 MG PO TABS: 8.0000 mg | ORAL_TABLET | Freq: Two times a day (BID) | ORAL | Status: DC | PRN

## 2011-11-18 ENCOUNTER — Ambulatory Visit: Payer: 59 | Admitting: Oncology

## 2011-11-18 ENCOUNTER — Ambulatory Visit: Payer: 59

## 2011-11-18 ENCOUNTER — Other Ambulatory Visit: Payer: 59 | Admitting: Lab

## 2011-11-25 ENCOUNTER — Ambulatory Visit (HOSPITAL_BASED_OUTPATIENT_CLINIC_OR_DEPARTMENT_OTHER): Payer: 59 | Admitting: Lab

## 2011-11-25 ENCOUNTER — Other Ambulatory Visit: Payer: Self-pay | Admitting: Medical Oncology

## 2011-11-25 ENCOUNTER — Other Ambulatory Visit: Payer: Self-pay | Admitting: *Deleted

## 2011-11-25 DIAGNOSIS — I82409 Acute embolism and thrombosis of unspecified deep veins of unspecified lower extremity: Secondary | ICD-10-CM

## 2011-11-25 DIAGNOSIS — C711 Malignant neoplasm of frontal lobe: Secondary | ICD-10-CM

## 2011-11-25 LAB — CBC WITH DIFFERENTIAL/PLATELET
BASO%: 0.2 % (ref 0.0–2.0)
LYMPH%: 22.1 % (ref 14.0–49.0)
MCHC: 35 g/dL (ref 32.0–36.0)
MONO#: 0.4 10*3/uL (ref 0.1–0.9)
Platelets: 188 10*3/uL (ref 140–400)
RBC: 4.26 10*6/uL (ref 4.20–5.82)
WBC: 4.3 10*3/uL (ref 4.0–10.3)
lymph#: 0.9 10*3/uL (ref 0.9–3.3)
nRBC: 0 % (ref 0–0)

## 2011-11-25 LAB — PROTIME-INR
INR: 2.6 (ref 2.00–3.50)
Protime: 31.2 Seconds — ABNORMAL HIGH (ref 10.6–13.4)

## 2011-11-26 ENCOUNTER — Ambulatory Visit (HOSPITAL_BASED_OUTPATIENT_CLINIC_OR_DEPARTMENT_OTHER): Payer: 59

## 2011-11-26 ENCOUNTER — Telehealth: Payer: Self-pay | Admitting: *Deleted

## 2011-11-26 ENCOUNTER — Encounter: Payer: Self-pay | Admitting: Family

## 2011-11-26 ENCOUNTER — Ambulatory Visit (HOSPITAL_BASED_OUTPATIENT_CLINIC_OR_DEPARTMENT_OTHER): Payer: 59 | Admitting: Family

## 2011-11-26 VITALS — BP 121/85 | HR 65 | Temp 97.8°F | Ht 71.0 in | Wt 160.0 lb

## 2011-11-26 DIAGNOSIS — C719 Malignant neoplasm of brain, unspecified: Secondary | ICD-10-CM

## 2011-11-26 DIAGNOSIS — C711 Malignant neoplasm of frontal lobe: Secondary | ICD-10-CM

## 2011-11-26 DIAGNOSIS — Z5112 Encounter for antineoplastic immunotherapy: Secondary | ICD-10-CM

## 2011-11-26 DIAGNOSIS — Z86718 Personal history of other venous thrombosis and embolism: Secondary | ICD-10-CM

## 2011-11-26 DIAGNOSIS — M719 Bursopathy, unspecified: Secondary | ICD-10-CM

## 2011-11-26 MED ORDER — SODIUM CHLORIDE 0.9 % IV SOLN
Freq: Once | INTRAVENOUS | Status: AC
  Administered 2011-11-26: 10:00:00 via INTRAVENOUS

## 2011-11-26 MED ORDER — HEPARIN SOD (PORK) LOCK FLUSH 100 UNIT/ML IV SOLN
500.0000 [IU] | Freq: Once | INTRAVENOUS | Status: AC | PRN
  Administered 2011-11-26: 500 [IU]
  Filled 2011-11-26: qty 5

## 2011-11-26 MED ORDER — SODIUM CHLORIDE 0.9 % IV SOLN
10.0000 mg/kg | Freq: Once | INTRAVENOUS | Status: AC
  Administered 2011-11-26: 725 mg via INTRAVENOUS
  Filled 2011-11-26: qty 29

## 2011-11-26 MED ORDER — SODIUM CHLORIDE 0.9 % IJ SOLN
10.0000 mL | INTRAMUSCULAR | Status: DC | PRN
  Administered 2011-11-26: 10 mL
  Filled 2011-11-26: qty 10

## 2011-11-26 NOTE — Telephone Encounter (Signed)
Per staff message from [place], I have schedule the patient for treatment through July. I have staff message her back with new appts.  JMw

## 2011-11-26 NOTE — Patient Instructions (Signed)
Pt refused AVS. Aware on next treatment day.

## 2011-11-26 NOTE — Progress Notes (Signed)
[facility]  Name: Roger Raymond                  DATE: 11/27/2011 MRN: [id number]                      DOB: [birth year]  CC: Hosie Spangle, M.D.  Consuello Masse, MD  DIAGNOSIS: Patient Active Problem List  Diagnoses Date Noted  . DVT (deep venous thrombosis) 07/08/2011  . 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  . Glioblastoma multiforme Yes  . History of DVT (deep vein thrombosis)     PREVIOUS THERAPY:  1. Whole brain radiation therapy with radiosensitizing Temodar at 75 mg/m2 for the duration of the radiation.  2. Temodar adjuvantly for six months given at 175 mg/m2 days one through five on a 28-day cycle for a total of six cycles. 3. Irinotecan and Avastin given on a weekly basis following [facility] protocol for 6 months. 4. Maintenance Avastin.every 2 weeks with Temodar every month on days 1 through 5.  5. Coumadin 10 mg alternating with 12.5 mg  CURRENT THERAPY: Maintenance Avastin every 2 weeks with Temodar every month on days 1-5.   INTERIM HISTORY: Feels remarkably well, just returned from a 3 month stay in [place]. Was seen by Medical Oncology in [place]. Has occasional headache (unchanged) which he attributes to dehydration. Chief complaint is bursitis in the left shoulder which limits range of motion. Has had injections in the shoulder in the past and may initiate repeat injection.   Wife states he has difficulty concentrating and some memory deficit. No balance or coordination difficulties. No vision changes. No seizure-like activity, remains on Keppra 500 mg twice daily. Takes Temodar 280 mg days 1-5 of cycle. Takes Coumadin10 mg Mon, Wed and Friday, 12.5 mg Sunday, Tues, Thurs and Saturday. No abnormal bruising or bleeding.   No cough or shortness of breath. No abdominal pain or new bone pain. Bowel and bladder function are normal. Appetite is good, with adequate fluid intake. Remainder of the 10 point   review of systems is negative.  PHYSICAL EXAM: BP 121/85  Pulse 65  Temp(Src) 97.8 F (36.6 C) (Oral)  Ht 5\' 11"  (1.803 m)  Wt 160 lb (72.576 kg)  BMI 22.32 kg/m2 General: Well developed, well nourished, in no acute distress.  EENT: No ocular or oral lesions. No stomatitis.  Respiratory: Lungs are clear to auscultation bilaterally with normal respiratory movement and no accessory muscle use. Cardiac: No murmur, rub or tachycardia. No upper or lower extremity edema.  GI: Abdomen is soft, no palpable hepatosplenomegaly. No fluid wave. No tenderness. Musculoskeletal: No kyphosis, no tenderness over the spine, ribs or hips. Lymph: No cervical, infraclavicular, axillary or inguinal adenopathy. Neuro: No focal neurological deficits. Psych: Alert and oriented X 3, appropriate mood and affect.    LABORATORY STUDIES:   Results for orders placed in visit on 11/25/11  PROTIME-INR      Component Value Range   Protime 31.2 (*) 10.6 - 13.4 (Seconds)   INR 2.60  2.00 - 3.50    Lovenox No    CBC WITH DIFFERENTIAL      Component Value Range   WBC 4.3  4.0 - 10.3 (10e3/uL)   NEUT# 2.6  1.5 - 6.5 (10e3/uL)   HGB 14.9  13.0 - 17.1 (g/dL)   HCT 42.6  38.4 - 49.9 (%)   Platelets 188  140 - 400 (  10e3/uL)   MCV 100.0 (*) 79.3 - 98.0 (fL)   MCH 35.0 (*) 27.2 - 33.4 (pg)   MCHC 35.0  32.0 - 36.0 (g/dL)   RBC 4.26  4.20 - 5.82 (10e6/uL)   RDW 13.9  11.0 - 14.6 (%)   lymph# 0.9  0.9 - 3.3 (10e3/uL)   MONO# 0.4  0.1 - 0.9 (10e3/uL)   Eosinophils Absolute 0.3  0.0 - 0.5 (10e3/uL)   Basophils Absolute 0.0  0.0 - 0.1 (10e3/uL)   NEUT% 62.2  39.0 - 75.0 (%)   LYMPH% 22.1  14.0 - 49.0 (%)   MONO% 9.6  0.0 - 14.0 (%)   EOS% 5.9  0.0 - 7.0 (%)   BASO% 0.2  0.0 - 2.0 (%)   nRBC 0  0 - 0 (%)  Urine Protein Dipstick: Negative.  IMPRESSION:  61 y/o male with:   1. 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   2. Chronic bursitis, left shoulder 3. Labs OK to treat.     PLAN:   1. Maintenance Avastin today.  2. Maintain current Coumadin dose. 3. Return 12/10/11 for next Avastin treatment with lab prior and appt with Dr. Humphrey Rolls.  4. Since INR has been stable, we will check INR every 2 weeks with treatment. They wish to minimize trips to the office to allow for travel in between treatments.    DISCUSSION:

## 2011-12-02 ENCOUNTER — Telehealth: Payer: Self-pay | Admitting: *Deleted

## 2011-12-02 ENCOUNTER — Telehealth: Payer: Self-pay | Admitting: Oncology

## 2011-12-02 NOTE — Telephone Encounter (Signed)
lmonvm adviisng the pt that his next appts are on 12/09/2011@12 :30pm

## 2011-12-02 NOTE — Telephone Encounter (Signed)
Per staff message from [place], I have adjusted the treatment appt on May 6th. Crystal aware.  JMW

## 2011-12-09 ENCOUNTER — Ambulatory Visit (HOSPITAL_BASED_OUTPATIENT_CLINIC_OR_DEPARTMENT_OTHER): Payer: 59 | Admitting: Oncology

## 2011-12-09 ENCOUNTER — Encounter: Payer: Self-pay | Admitting: Oncology

## 2011-12-09 ENCOUNTER — Other Ambulatory Visit: Payer: Self-pay | Admitting: Medical Oncology

## 2011-12-09 ENCOUNTER — Ambulatory Visit (HOSPITAL_BASED_OUTPATIENT_CLINIC_OR_DEPARTMENT_OTHER): Payer: 59

## 2011-12-09 ENCOUNTER — Other Ambulatory Visit: Payer: Self-pay | Admitting: Oncology

## 2011-12-09 ENCOUNTER — Other Ambulatory Visit (HOSPITAL_BASED_OUTPATIENT_CLINIC_OR_DEPARTMENT_OTHER): Payer: 59

## 2011-12-09 VITALS — BP 134/91 | HR 59 | Temp 97.4°F | Ht 71.0 in | Wt 160.4 lb

## 2011-12-09 VITALS — BP 134/83 | HR 72 | Temp 97.0°F

## 2011-12-09 DIAGNOSIS — Z5111 Encounter for antineoplastic chemotherapy: Secondary | ICD-10-CM

## 2011-12-09 DIAGNOSIS — C711 Malignant neoplasm of frontal lobe: Secondary | ICD-10-CM

## 2011-12-09 DIAGNOSIS — I82409 Acute embolism and thrombosis of unspecified deep veins of unspecified lower extremity: Secondary | ICD-10-CM

## 2011-12-09 DIAGNOSIS — Z86718 Personal history of other venous thrombosis and embolism: Secondary | ICD-10-CM

## 2011-12-09 DIAGNOSIS — Z5112 Encounter for antineoplastic immunotherapy: Secondary | ICD-10-CM

## 2011-12-09 DIAGNOSIS — C719 Malignant neoplasm of brain, unspecified: Secondary | ICD-10-CM

## 2011-12-09 LAB — COMPREHENSIVE METABOLIC PANEL
ALT: 45 U/L (ref 0–53)
Albumin: 4.6 g/dL (ref 3.5–5.2)
CO2: 27 mEq/L (ref 19–32)
Chloride: 101 mEq/L (ref 96–112)
Glucose, Bld: 103 mg/dL — ABNORMAL HIGH (ref 70–99)
Potassium: 4.1 mEq/L (ref 3.5–5.3)
Sodium: 139 mEq/L (ref 135–145)
Total Bilirubin: 0.5 mg/dL (ref 0.3–1.2)
Total Protein: 7.1 g/dL (ref 6.0–8.3)

## 2011-12-09 LAB — CBC WITH DIFFERENTIAL/PLATELET
BASO%: 0.2 % (ref 0.0–2.0)
EOS%: 2.2 % (ref 0.0–7.0)
LYMPH%: 26.9 % (ref 14.0–49.0)
MCHC: 35.2 g/dL (ref 32.0–36.0)
MCV: 98.4 fL — ABNORMAL HIGH (ref 79.3–98.0)
MONO%: 7.3 % (ref 0.0–14.0)
Platelets: 108 10*3/uL — ABNORMAL LOW (ref 140–400)
RBC: 4.47 10*6/uL (ref 4.20–5.82)
RDW: 13.5 % (ref 11.0–14.6)
nRBC: 0 % (ref 0–0)

## 2011-12-09 LAB — UA PROTEIN, DIPSTICK - CHCC: Protein, ur: NEGATIVE mg/dL

## 2011-12-09 LAB — PROTIME-INR: Protime: 36 Seconds — ABNORMAL HIGH (ref 10.6–13.4)

## 2011-12-09 MED ORDER — SODIUM CHLORIDE 0.9 % IV SOLN
10.0000 mg/kg | Freq: Once | INTRAVENOUS | Status: AC
  Administered 2011-12-09: 725 mg via INTRAVENOUS
  Filled 2011-12-09: qty 29

## 2011-12-09 MED ORDER — WARFARIN SODIUM 5 MG PO TABS: 5.0000 mg | ORAL_TABLET | Freq: Every day | ORAL | Status: DC

## 2011-12-09 MED ORDER — HEPARIN SOD (PORK) LOCK FLUSH 100 UNIT/ML IV SOLN
500.0000 [IU] | Freq: Once | INTRAVENOUS | Status: AC | PRN
  Administered 2011-12-09: 500 [IU]
  Filled 2011-12-09: qty 5

## 2011-12-09 MED ORDER — SODIUM CHLORIDE 0.9 % IV SOLN
Freq: Once | INTRAVENOUS | Status: AC
  Administered 2011-12-09: 14:00:00 via INTRAVENOUS

## 2011-12-09 MED ORDER — TEMOZOLOMIDE 100 MG PO CAPS: 100.0000 mg | ORAL_CAPSULE | Freq: Every day | ORAL | Status: AC

## 2011-12-09 MED ORDER — SODIUM CHLORIDE 0.9 % IJ SOLN
10.0000 mL | INTRAMUSCULAR | Status: DC | PRN
  Administered 2011-12-09: 10 mL
  Filled 2011-12-09: qty 10

## 2011-12-09 MED ORDER — TEMOZOLOMIDE 180 MG PO CAPS: 180.0000 mg | ORAL_CAPSULE | Freq: Every day | ORAL | Status: AC

## 2011-12-09 NOTE — Patient Instructions (Signed)
[facility] Discharge Instructions for Patients Receiving Chemotherapy  Today you received the following chemotherapy agents Avastin  To help prevent nausea and vomiting after your treatment, we encourage you to take your nausea medication  Begin taking it at 7 pm and take it as often as prescribed for the next 24 to 72 hours.   If you develop nausea and vomiting that is not controlled by your nausea medication, call the clinic. If it is after clinic hours your family physician or the after hours number for the clinic or go to the Emergency Department.   BELOW ARE SYMPTOMS THAT SHOULD BE REPORTED IMMEDIATELY:  *FEVER GREATER THAN 100.5 F  *CHILLS WITH OR WITHOUT FEVER  NAUSEA AND VOMITING THAT IS NOT CONTROLLED WITH YOUR NAUSEA MEDICATION  *UNUSUAL SHORTNESS OF BREATH  *UNUSUAL BRUISING OR BLEEDING  TENDERNESS IN MOUTH AND THROAT WITH OR WITHOUT PRESENCE OF ULCERS  *URINARY PROBLEMS  *BOWEL PROBLEMS  UNUSUAL RASH Items with * indicate a potential emergency and should be followed up as soon as possible.  One of the nurses will contact you 24 hours after your treatment. Please let the nurse know about any problems that you may have experienced. Feel free to call the clinic you have any questions or concerns. The clinic phone number is [phone].   I have been informed and understand all the instructions given to me. I know to contact the clinic, my physician, or go to the Emergency Department if any problems should occur. I do not have any questions at this time, but understand that I may call the clinic during office hours   should I have any questions or need assistance in obtaining follow up care.    __________________________________________  _____________  __________ Signature of Patient or Authorized Representative            Date                   Time    __________________________________________ Nurse's Signature    

## 2011-12-09 NOTE — Patient Instructions (Signed)
1. Proceed with avastin today.  2. Doppler study for DVT  3. See me back in 2 weeks

## 2011-12-09 NOTE — Progress Notes (Signed)
OFFICE PROGRESS NOTE  Dr. 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 The tumor chemotherapy consisted of Temodar 75 mg per meter squared.  #2 patient was then started on Temodar given days 1 through 5 every 28 days with concomitant Avastin given every 2 weeks.  #3 patient also developed lower extremity DVT as well as as pulmonary embolism he was started on Coumadin he is maintained on this at 12.5 mg daily to try to maintain an INR between 2 and 2.5.  CURRENT THERAPY: Patient is here for his scheduled Avastin.  INTERVAL HISTORY: Roger Raymond 61 y.o. male returns for followup visit today for his treatment. Overall he is doing well. He tolerated his last dose of Avastin quite well without any significant complaints. He denies any fevers chills night sweats headaches shortness of breath chest pains palpitations. He has no bleeding. He has noticed some cramping in his left leg. I have therefore recommended getting a Doppler ultrasound performed. His INR is 3.0 and therapeutic we will continue his Coumadin at this point. He also is receiving Temodar at 280 mg days 1 through 5 based on his platelet count. He will begin this as soon as he gets his prescriptions filled  MEDICAL HISTORY: Past Medical History  Diagnosis Date  . 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 DVT (deep venous thrombosis) 07/08/2011    ALLERGIES:   has no known allergies.  MEDICATIONS:  Current Outpatient Prescriptions  Medication Sig Dispense Refill  . acetaminophen (TYLENOL) 325 MG tablet Take 650 mg by mouth every 6 (six) hours as needed.        .  chlorproMAZINE (THORAZINE) 25 MG tablet Take 25 mg by mouth every 6 (six) hours as needed.        . diphenoxylate-atropine (LOMOTIL) 2.5-0.025 MG per tablet Take 1 tablet by mouth 4 (four) times daily as needed. 1-2 tabs prn       . levETIRAcetam (KEPPRA) 500 MG tablet Take 500 mg by mouth 2 (two) times daily.        [name] lidocaine-prilocaine (EMLA) cream Apply topically as needed. Apply to port 1-2 hours before procedure       . ondansetron (ZOFRAN) 8 MG tablet Take 1 tablet (8 mg total) by mouth every 12 (twelve) hours as needed.  234 tablet  1  . temozolomide (TEMODAR) 180 MG capsule Take 360 mg by mouth daily. May take on an empty stomach or at bedtime to decrease nausea & vomiting.       . warfarin (COUMADIN) 10 MG tablet Take 10 mg by mouth daily. 10mg  MWF, 12.5 all others   04/15/11       . warfarin (COUMADIN) 5 MG tablet TAKE 2 TABS EVERY MONDAY, WEDNESDAY, AND FRIDAY. TAKE 2&1/2 TABS ALL OTHER DAYS OR AS DIRECTED  64 tablet  1    SURGICAL HISTORY:  Past Surgical History  Procedure Date  . Vasectomy     REVIEW OF SYSTEMS:  Pertinent items are noted in HPI.   PHYSICAL EXAMINATION: General appearance: alert, cooperative and appears stated age Head: Normocephalic, without obvious abnormality, atraumatic Neck: no adenopathy, no carotid bruit, no  JVD, supple, symmetrical, trachea midline and thyroid not enlarged, symmetric, no tenderness/mass/nodules Lymph nodes: Cervical, supraclavicular, and axillary nodes normal. Resp: clear to auscultation bilaterally and normal percussion bilaterally Back: symmetric, no curvature. ROM normal. No CVA tenderness. Cardio: regular rate and rhythm, S1, S2 normal, no murmur, click, rub or gallop and normal apical impulse GI: soft, non-tender; bowel sounds normal; no masses,  no organomegaly Extremities: extremities normal, atraumatic, no cyanosis or edema Neurologic: Alert and oriented X 3, normal strength and tone. Normal symmetric reflexes. Normal  coordination and gait  ECOG PERFORMANCE STATUS: 0 - Asymptomatic  Blood pressure 134/91, pulse 59, temperature 97.4 F (36.3 C), temperature source Oral, height 5\' 11"  (1.803 m), weight 160 lb 6.4 oz (72.757 kg).  LABORATORY DATA: Lab Results  Component Value Date   WBC 4.1 12/09/2011   HGB 15.5 12/09/2011   HCT 44.0 12/09/2011   MCV 98.4* 12/09/2011   PLT 108* 12/09/2011      Chemistry      Component Value Date/Time   NA 140 07/22/2011 1145   K 4.1 07/22/2011 1145   CL 102 07/22/2011 1145   CO2 26 07/22/2011 1145   BUN 15 07/22/2011 1145   CREATININE 0.92 07/22/2011 1145      Component Value Date/Time   CALCIUM 9.5 07/22/2011 1145   ALKPHOS 130* 07/22/2011 1145   AST 39* 07/22/2011 1145   ALT 53 07/22/2011 1145   BILITOT 0.3 07/22/2011 1145       RADIOGRAPHIC STUDIES:  No results found.  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 Overall he is doing well and tolerating his therapy quite well. He also is getting  Avastin and tolerating it very well. For his pulmonary embolus he is on Coumadin and he is very therapeutic with his INR. We are checking his INR is at [facility] with the results being faxed to [place] here at [facility].   PLAN: We will proceed with scheduled Avastin. He will keep the same dose of the Coumadin at this point since his INR is therapeutic. He will return in 2 weeks' time for followup and next dose of Avastin. We'll get a Doppler ultrasound of his left lower extremity to rule out a recurrence of a DVT. He will proceed with Temodar 280 mg day one through 5 with this cycle.   All questions were answered. The patient knows to call the clinic with any problems, questions or concerns. We can certainly see the patient much sooner if necessary.  I spent 20 minutes counseling the patient face to face. The total time spent in the appointment was 30 minutes.    Marcy Panning, MD Medical/Oncology [facility] [phone] (beeper) [phone] (Office)  12/09/2011, 1:06 PM

## 2011-12-10 ENCOUNTER — Ambulatory Visit (HOSPITAL_COMMUNITY): Payer: 59

## 2011-12-10 ENCOUNTER — Ambulatory Visit (HOSPITAL_COMMUNITY)
Admission: RE | Admit: 2011-12-10 | Discharge: 2011-12-10 | Disposition: A | Discharge status: Home / Self Care | Payer: 59 | Source: Ambulatory Visit | Attending: Oncology | Admitting: Oncology

## 2011-12-10 ENCOUNTER — Telehealth: Payer: Self-pay | Admitting: Oncology

## 2011-12-10 ENCOUNTER — Telehealth: Payer: Self-pay | Admitting: *Deleted

## 2011-12-10 DIAGNOSIS — Z86718 Personal history of other venous thrombosis and embolism: Secondary | ICD-10-CM | POA: Insufficient documentation

## 2011-12-10 DIAGNOSIS — M79609 Pain in unspecified limb: Secondary | ICD-10-CM | POA: Insufficient documentation

## 2011-12-10 DIAGNOSIS — I82409 Acute embolism and thrombosis of unspecified deep veins of unspecified lower extremity: Secondary | ICD-10-CM

## 2011-12-10 DIAGNOSIS — I824Y9 Acute embolism and thrombosis of unspecified deep veins of unspecified proximal lower extremity: Secondary | ICD-10-CM | POA: Insufficient documentation

## 2011-12-10 NOTE — Progress Notes (Signed)
VASCULAR LAB PRELIMINARY  PRELIMINARY  PRELIMINARY  PRELIMINARY  Left lower extremity venous duplex completed.    Preliminary report:  No evidence of acute DVT.  Previous DVT in FV and popliteal vein remain with some minimal reconstitution noted.    Terance Hart, RVT 12/10/2011, 11:37 AM

## 2011-12-10 NOTE — Telephone Encounter (Signed)
Pt has his ma-aug 2013 appt calendars

## 2011-12-10 NOTE — Telephone Encounter (Signed)
Per staff from [facility], I have scheduled the treatment appt for the patient. Crystal aware appts in computer.  JMW

## 2011-12-10 NOTE — Telephone Encounter (Signed)
S//w the pt and he is aware of his next f/u appt in may and to pick up a schedule when he comes in the office

## 2011-12-11 ENCOUNTER — Telehealth: Payer: Self-pay | Admitting: Medical Oncology

## 2011-12-11 NOTE — Telephone Encounter (Signed)
Received call from patient stating that "[facility] had not received any prescriptions for him."  Called [facility] and was [facility] [place] were patient previously received his Temodar.  No answer from pharmacy, lmovm for a return call.  Spoke with different [facility] representative and transferred to pharmacy for verbal order.  Notified patient that verbal order was given to pharmacy for Temodar and it should be available to him soon. Patient expressed understanding.

## 2011-12-23 ENCOUNTER — Ambulatory Visit (HOSPITAL_BASED_OUTPATIENT_CLINIC_OR_DEPARTMENT_OTHER): Payer: 59 | Admitting: Oncology

## 2011-12-23 ENCOUNTER — Ambulatory Visit (HOSPITAL_BASED_OUTPATIENT_CLINIC_OR_DEPARTMENT_OTHER): Payer: 59

## 2011-12-23 ENCOUNTER — Encounter: Payer: Self-pay | Admitting: Oncology

## 2011-12-23 ENCOUNTER — Other Ambulatory Visit (HOSPITAL_BASED_OUTPATIENT_CLINIC_OR_DEPARTMENT_OTHER): Payer: 59 | Admitting: Lab

## 2011-12-23 VITALS — BP 126/86 | HR 54 | Temp 97.4°F | Ht 71.0 in | Wt 161.4 lb

## 2011-12-23 DIAGNOSIS — R040 Epistaxis: Secondary | ICD-10-CM

## 2011-12-23 DIAGNOSIS — Z7901 Long term (current) use of anticoagulants: Secondary | ICD-10-CM

## 2011-12-23 DIAGNOSIS — Z5112 Encounter for antineoplastic immunotherapy: Secondary | ICD-10-CM

## 2011-12-23 DIAGNOSIS — C711 Malignant neoplasm of frontal lobe: Secondary | ICD-10-CM

## 2011-12-23 DIAGNOSIS — I82409 Acute embolism and thrombosis of unspecified deep veins of unspecified lower extremity: Secondary | ICD-10-CM

## 2011-12-23 DIAGNOSIS — Z5111 Encounter for antineoplastic chemotherapy: Secondary | ICD-10-CM

## 2011-12-23 DIAGNOSIS — C719 Malignant neoplasm of brain, unspecified: Secondary | ICD-10-CM

## 2011-12-23 DIAGNOSIS — Z86718 Personal history of other venous thrombosis and embolism: Secondary | ICD-10-CM

## 2011-12-23 DIAGNOSIS — I2699 Other pulmonary embolism without acute cor pulmonale: Secondary | ICD-10-CM

## 2011-12-23 LAB — COMPREHENSIVE METABOLIC PANEL
Alkaline Phosphatase: 72 U/L (ref 39–117)
CO2: 26 mEq/L (ref 19–32)
Creatinine, Ser: 0.81 mg/dL (ref 0.50–1.35)
Glucose, Bld: 85 mg/dL (ref 70–99)
Total Bilirubin: 0.5 mg/dL (ref 0.3–1.2)

## 2011-12-23 LAB — CBC WITH DIFFERENTIAL/PLATELET
Eosinophils Absolute: 0.1 10*3/uL (ref 0.0–0.5)
HCT: 40.9 % (ref 38.4–49.9)
LYMPH%: 26.5 % (ref 14.0–49.0)
MCHC: 35.7 g/dL (ref 32.0–36.0)
MCV: 98.6 fL — ABNORMAL HIGH (ref 79.3–98.0)
MONO#: 0.2 10*3/uL (ref 0.1–0.9)
MONO%: 6.9 % (ref 0.0–14.0)
NEUT#: 2 10*3/uL (ref 1.5–6.5)
NEUT%: 63.8 % (ref 39.0–75.0)
Platelets: 143 10*3/uL (ref 140–400)
WBC: 3.2 10*3/uL — ABNORMAL LOW (ref 4.0–10.3)

## 2011-12-23 LAB — PROTIME-INR: Protime: 39.6 Seconds — ABNORMAL HIGH (ref 10.6–13.4)

## 2011-12-23 LAB — UA PROTEIN, DIPSTICK - CHCC: Protein, ur: NEGATIVE mg/dL

## 2011-12-23 MED ORDER — SODIUM CHLORIDE 0.9 % IV SOLN
Freq: Once | INTRAVENOUS | Status: AC
  Administered 2011-12-23: 13:00:00 via INTRAVENOUS

## 2011-12-23 MED ORDER — SODIUM CHLORIDE 0.9 % IJ SOLN
10.0000 mL | INTRAMUSCULAR | Status: DC | PRN
  Administered 2011-12-23: 10 mL
  Filled 2011-12-23: qty 10

## 2011-12-23 MED ORDER — HEPARIN SOD (PORK) LOCK FLUSH 100 UNIT/ML IV SOLN
500.0000 [IU] | Freq: Once | INTRAVENOUS | Status: AC | PRN
  Administered 2011-12-23: 500 [IU]
  Filled 2011-12-23: qty 5

## 2011-12-23 MED ORDER — SODIUM CHLORIDE 0.9 % IV SOLN
10.0000 mg/kg | Freq: Once | INTRAVENOUS | Status: AC
  Administered 2011-12-23: 725 mg via INTRAVENOUS
  Filled 2011-12-23: qty 29

## 2011-12-23 NOTE — Patient Instructions (Signed)
[facility] Discharge Instructions for Patients Receiving Chemotherapy  Today you received the following chemotherapy agents Avastin  To help prevent nausea and vomiting after your treatment, we encourage you to take your nausea medication  Begin taking it at 7 pm and take it as often as prescribed for the next 24 to 72 hours.   If you develop nausea and vomiting that is not controlled by your nausea medication, call the clinic. If it is after clinic hours your family physician or the after hours number for the clinic or go to the Emergency Department.   BELOW ARE SYMPTOMS THAT SHOULD BE REPORTED IMMEDIATELY:  *FEVER GREATER THAN 100.5 F  *CHILLS WITH OR WITHOUT FEVER  NAUSEA AND VOMITING THAT IS NOT CONTROLLED WITH YOUR NAUSEA MEDICATION  *UNUSUAL SHORTNESS OF BREATH  *UNUSUAL BRUISING OR BLEEDING  TENDERNESS IN MOUTH AND THROAT WITH OR WITHOUT PRESENCE OF ULCERS  *URINARY PROBLEMS  *BOWEL PROBLEMS  UNUSUAL RASH Items with * indicate a potential emergency and should be followed up as soon as possible.  One of the nurses will contact you 24 hours after your treatment. Please let the nurse know about any problems that you may have experienced. Feel free to call the clinic you have any questions or concerns. The clinic phone number is [phone].   I have been informed and understand all the instructions given to me. I know to contact the clinic, my physician, or go to the Emergency Department if any problems should occur. I do not have any questions at this time, but understand that I may call the clinic during office hours   should I have any questions or need assistance in obtaining follow up care.    __________________________________________  _____________  __________ Signature of Patient or Authorized Representative            Date                   Time    __________________________________________ Nurse's Signature    

## 2011-12-23 NOTE — Patient Instructions (Signed)
1. Proceed with avastin today  2. See ENT for evaluation of nose bleeds (call your friend to be seen this week)  3. Reduce dose of Coumadin to 12.5 mg on Tuesday/Thursday and 10 mg rest of the days.  4. Follow up in 2 weeks for next avastin, Next Temodar dosing will be in 2 weeks

## 2011-12-23 NOTE — Progress Notes (Signed)
OFFICE PROGRESS NOTE  Dr. 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 The tumor chemotherapy consisted of Temodar 75 mg per meter squared.  #2 patient was then started on Temodar given days 1 through 5 every 28 days with concomitant Avastin given every 2 weeks.  #3 patient also developed lower extremity DVT as well as as pulmonary embolism he was started on Coumadin he is maintained on this at 12.5 mg daily to try to maintain an INR between 2 and 2.5.  CURRENT THERAPY: Patient is here for his scheduled Avastin.  INTERVAL HISTORY: Roger Raymond 61 y.o. male returns for followup visit today for his treatment. Clinically he seems to be doing well. In fact this weekend he did go fishing in the [place]. He did apply sunscreen but he still seems to have For quite a bit of sun tanning. He has no headaches double vision blurring of vision no fevers chills or night sweats. He does tell me that he on occasion has epistaxis. Which has been ongoing and chronic. And this is quite concerning since he is receiving Avastin. He also has noticeable left lower extremity swelling. We did do a Doppler study on this and was negative for a DVT. His INR today is 3.3 he has no easy bruising. He has no gait disturbances. For the Memorial Day weekend patient is planning on going to [place] for fishing with one of his brothers. He is quite an avid fisherman. And in July 2013 patient is planning on going to [place] with his son again for fishing trip. Remainder of the 10 point review of systems is negative.  MEDICAL HISTORY: Past Medical History  Diagnosis Date    . 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 DVT (deep venous thrombosis) 07/08/2011    ALLERGIES:   has no known allergies.  MEDICATIONS:  Current Outpatient Prescriptions  Medication Sig Dispense Refill  . acetaminophen (TYLENOL) 325 MG tablet Take 650 mg by mouth every 6 (six) hours as needed.        . chlorproMAZINE (THORAZINE) 25 MG tablet Take 25 mg by mouth every 6 (six) hours as needed.        . diphenoxylate-atropine (LOMOTIL) 2.5-0.025 MG per tablet Take 1 tablet by mouth 4 (four) times daily as needed. 1-2 tabs prn       . levETIRAcetam (KEPPRA) 500 MG tablet Take 500 mg by mouth 2 (two) times daily.        [name] lidocaine-prilocaine (EMLA) cream Apply topically as needed. Apply to port 1-2 hours before procedure       . ondansetron (ZOFRAN) 8 MG tablet Take 1 tablet (8 mg total) by mouth every 12 (twelve) hours as needed.  234 tablet  1  . temozolomide (TEMODAR) 250 MG capsule Take 280 mg by mouth daily. May take on an empty stomach or at bedtime to decrease nausea & vomiting.  Take x5 days then off  x3 wks      . warfarin (COUMADIN) 10 MG tablet Take 10 mg by mouth daily. 10mg  MWF, 12.5 all others   04/15/11       . warfarin (COUMADIN)  5 MG tablet Take 1 tablet (5 mg total) by mouth daily.  64 tablet  1    SURGICAL HISTORY:  Past Surgical History  Procedure Date  . Vasectomy     REVIEW OF SYSTEMS:  Pertinent items are noted in HPI.   PHYSICAL EXAMINATION: General appearance: alert, cooperative and appears stated age Head: Normocephalic, without obvious abnormality, atraumatic Neck: no adenopathy, no carotid bruit, no JVD, supple, symmetrical, trachea midline and thyroid not enlarged, symmetric, no tenderness/mass/nodules Lymph nodes: Cervical, supraclavicular, and axillary nodes normal. Resp: clear to auscultation bilaterally and normal percussion bilaterally Back: symmetric, no curvature. ROM normal. No CVA tenderness. Cardio: regular rate and rhythm, S1, S2 normal,  no murmur, click, rub or gallop and normal apical impulse GI: soft, non-tender; bowel sounds normal; no masses,  no organomegaly Extremities: extremities normal, atraumatic, no cyanosis or edema Neurologic: Alert and oriented X 3, normal strength and tone. Normal symmetric reflexes. Normal coordination and gait  ECOG PERFORMANCE STATUS: 0 - Asymptomatic  Blood pressure 126/86, pulse 54, temperature 97.4 F (36.3 C), temperature source Oral, height 5\' 11"  (1.803 m), weight 161 lb 6.4 oz (73.211 kg).  LABORATORY DATA: Lab Results  Component Value Date   WBC 3.2* 12/23/2011   HGB 14.6 12/23/2011   HCT 40.9 12/23/2011   MCV 98.6* 12/23/2011   PLT 143 12/23/2011      Chemistry      Component Value Date/Time   NA 139 12/09/2011 1425   K 4.1 12/09/2011 1425   CL 101 12/09/2011 1425   CO2 27 12/09/2011 1425   BUN 19 12/09/2011 1425   CREATININE 0.92 12/09/2011 1425      Component Value Date/Time   CALCIUM 9.2 12/09/2011 1425   ALKPHOS 89 12/09/2011 1425   AST 34 12/09/2011 1425   ALT 45 12/09/2011 1425   BILITOT 0.5 12/09/2011 1425       RADIOGRAPHIC STUDIES:  No results found.  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 Overall he is doing well and tolerating his therapy quite well. He also is getting  Avastin and tolerating it very well. For his pulmonary embolus he is on Coumadin and he is very therapeutic with his INR. We are checking his INR is at [facility] with the results being faxed to [place] here at [facility].   PLAN: We will proceed with scheduled Avastin. Patient will reduce the dose of his Coumadin to 12.5 mg on Tuesday and Thursdays with remainder of the days being 10 mg. He will have another INR performed in about 2 weeks' time when he comes in for his Avastin. He also discussed the epistaxis and I have recommended that he be seen by an ENT physician. Patient will set up an ENT appointment in  [place]. He knows to report any problems questions or concerns. He will return in 2 weeks' time. With that visit he will begin his Avastin 280 mg days 1 through 8. His CBC looks terrific today with a normal platelet count.  All questions were answered. The patient knows to call the clinic with any problems, questions or concerns. We can certainly see the patient much sooner if necessary.  I spent >25 minutes counseling the patient face to face. The total time spent in the appointment was 30 minutes.    Marcy Panning, MD Medical/Oncology [facility] [phone] (beeper) [phone] (Office)  12/23/2011, 12:04 PM

## 2012-01-06 ENCOUNTER — Ambulatory Visit: Payer: 59

## 2012-01-06 ENCOUNTER — Ambulatory Visit (HOSPITAL_BASED_OUTPATIENT_CLINIC_OR_DEPARTMENT_OTHER): Payer: 59 | Admitting: Family

## 2012-01-06 ENCOUNTER — Other Ambulatory Visit (HOSPITAL_BASED_OUTPATIENT_CLINIC_OR_DEPARTMENT_OTHER): Payer: 59 | Admitting: Lab

## 2012-01-06 ENCOUNTER — Encounter: Payer: Self-pay | Admitting: Family

## 2012-01-06 VITALS — BP 129/67 | HR 55 | Temp 98.0°F | Ht 71.0 in | Wt 161.8 lb

## 2012-01-06 DIAGNOSIS — C711 Malignant neoplasm of frontal lobe: Secondary | ICD-10-CM

## 2012-01-06 DIAGNOSIS — Z5112 Encounter for antineoplastic immunotherapy: Secondary | ICD-10-CM

## 2012-01-06 DIAGNOSIS — M67919 Unspecified disorder of synovium and tendon, unspecified shoulder: Secondary | ICD-10-CM

## 2012-01-06 DIAGNOSIS — I82409 Acute embolism and thrombosis of unspecified deep veins of unspecified lower extremity: Secondary | ICD-10-CM

## 2012-01-06 LAB — CBC WITH DIFFERENTIAL/PLATELET
BASO%: 0.3 % (ref 0.0–2.0)
EOS%: 2.4 % (ref 0.0–7.0)
MCH: 34.9 pg — ABNORMAL HIGH (ref 27.2–33.4)
MCHC: 35.3 g/dL (ref 32.0–36.0)
MONO#: 0.3 10*3/uL (ref 0.1–0.9)
NEUT%: 61.1 % (ref 39.0–75.0)
RBC: 4.16 10*6/uL — ABNORMAL LOW (ref 4.20–5.82)
RDW: 13.5 % (ref 11.0–14.6)
WBC: 3.4 10*3/uL — ABNORMAL LOW (ref 4.0–10.3)
lymph#: 0.9 10*3/uL (ref 0.9–3.3)
nRBC: 0 % (ref 0–0)

## 2012-01-06 LAB — COMPREHENSIVE METABOLIC PANEL
ALT: 47 U/L (ref 0–53)
AST: 38 U/L — ABNORMAL HIGH (ref 0–37)
Albumin: 4.4 g/dL (ref 3.5–5.2)
Alkaline Phosphatase: 75 U/L (ref 39–117)
BUN: 14 mg/dL (ref 6–23)
Potassium: 4.4 mEq/L (ref 3.5–5.3)
Sodium: 139 mEq/L (ref 135–145)

## 2012-01-06 LAB — PROTIME-INR: INR: 2.6 (ref 2.00–3.50)

## 2012-01-06 MED ORDER — SODIUM CHLORIDE 0.9 % IV SOLN
Freq: Once | INTRAVENOUS | Status: AC
  Administered 2012-01-06: 12:00:00 via INTRAVENOUS

## 2012-01-06 MED ORDER — SODIUM CHLORIDE 0.9 % IV SOLN
10.0000 mg/kg | Freq: Once | INTRAVENOUS | Status: AC
  Administered 2012-01-06: 725 mg via INTRAVENOUS
  Filled 2012-01-06: qty 29

## 2012-01-06 MED ORDER — HEPARIN SOD (PORK) LOCK FLUSH 100 UNIT/ML IV SOLN
500.0000 [IU] | Freq: Once | INTRAVENOUS | Status: AC | PRN
  Administered 2012-01-06: 500 [IU]
  Filled 2012-01-06: qty 5

## 2012-01-06 MED ORDER — SODIUM CHLORIDE 0.9 % IJ SOLN
10.0000 mL | INTRAMUSCULAR | Status: DC | PRN
  Administered 2012-01-06: 10 mL
  Filled 2012-01-06: qty 10

## 2012-01-06 NOTE — Patient Instructions (Signed)
[facility] Discharge Instructions for Patients Receiving Chemotherapy  Today you received the following chemotherapy agents  AVASTIN  To help prevent nausea and vomiting after your treatment, we encourage you to take your nausea medication as directed by your provider.  If you develop nausea and vomiting that is not controlled by your nausea medication, call the clinic. If it is after clinic hours your family physician or the after hours number for the clinic or go to the Emergency Department.   BELOW ARE SYMPTOMS THAT SHOULD BE REPORTED IMMEDIATELY:  *FEVER GREATER THAN 100.5 F  *CHILLS WITH OR WITHOUT FEVER  NAUSEA AND VOMITING THAT IS NOT CONTROLLED WITH YOUR NAUSEA MEDICATION  *UNUSUAL SHORTNESS OF BREATH  *UNUSUAL BRUISING OR BLEEDING  TENDERNESS IN MOUTH AND THROAT WITH OR WITHOUT PRESENCE OF ULCERS  *URINARY PROBLEMS  *BOWEL PROBLEMS  UNUSUAL RASH Items with * indicate a potential emergency and should be followed up as soon as possible.  One of the nurses will contact you 24 hours after your treatment. Please let the nurse know about any problems that you may have experienced. Feel free to call the clinic you have any questions or concerns. The clinic phone number is [phone].   I have been informed and understand all the instructions given to me. I know to contact the clinic, my physician, or go to the Emergency Department if any problems should occur. I do not have any questions at this time, but understand that I may call the clinic during office hours   should I have any questions or need assistance in obtaining follow up care.    __________________________________________  _____________  __________ Signature of Patient or Authorized Representative            Date                   Time    __________________________________________ Nurse's Signature    

## 2012-01-06 NOTE — Progress Notes (Signed)
[facility]  Name: Roger Raymond                  DATE: 01/06/2012 MRN: [id number]                      DOB: August 10, 1950  CC: Hewitt Shorts, M.D.  Fara Chute, MD  DIAGNOSIS: Patient Active Problem List  Diagnoses Date Noted  . DVT (deep venous thrombosis) 07/08/2011  . 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  . 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 Whole brain radiation therapy with radiosensitizing Temodar at 75 mg/m2 for the duration of the radiation.  2. Temodar adjuvantly for six months given at 175 mg/m2 days one through five on a 28-day cycle for a total of six cycles. 3. Irinotecan and Avastin given on a weekly basis following [facility] protocol for 6 months. 4. Maintenance Avastin.every 2 weeks with Temodar every month on days 1 through 5.  5. Coumadin 10 mg alternating with 12.5 mg  CURRENT THERAPY: Maintenance Avastin every 2 weeks with Temodar every month on days 1-5.   INTERIM HISTORY: Feels remarkably well, leaving this week for a week-long trip to [place]. Has a fishing trip to [place] with his son planned for July.   Has occasional headache (unchanged) which he attributes to dehydration. Chief complaint is bursitis in the left shoulder which limits range of motion. Has had injections in the shoulder in the past and may initiate repeat injection. Has had episodic epistaxis, none recently. Has appt with ENT in 2 weeks.   Wife states he has difficulty concentrating and some memory deficit. No balance or coordination difficulties. No vision changes. No seizure-like activity, remains on Keppra 500 mg twice daily. Takes Temodar 280 mg days 1-5 of cycle. Takes Coumadin 10 mg Mon, Wed and Friday, 12.5 mg Sunday, Tues, Thurs and Saturday. No abnormal bruising or bleeding.   No cough or shortness of breath. No abdominal pain or new bone pain. Bowel and bladder function are  normal. Appetite is good, with adequate fluid intake. Remainder of the 10 point  review of systems is negative.  PHYSICAL EXAM: BP 129/67  Pulse 55  Temp(Src) 98 F (36.7 C) (Oral)  Ht 5\' 11"  (1.803 m)  Wt 161 lb 12.8 oz (73.392 kg)  BMI 22.57 kg/m2 General: Well developed, well nourished, in no acute distress.  EENT: No ocular or oral lesions. No stomatitis.  Respiratory: Lungs are clear to auscultation bilaterally with normal respiratory movement and no accessory muscle use. Cardiac: No murmur, rub or tachycardia. No upper or lower extremity edema.  GI: Abdomen is soft, no palpable hepatosplenomegaly. No fluid wave. No tenderness. Musculoskeletal: No kyphosis, no tenderness over the spine, ribs or hips. Lymph: No cervical, infraclavicular, axillary or inguinal adenopathy. Neuro: No focal neurological deficits. Psych: Alert and oriented X 3, appropriate mood and affect.    LABORATORY STUDIES:   Results for orders placed in visit on 01/06/12  CBC WITH DIFFERENTIAL      Component Value Range   WBC 3.4 (*) 4.0 - 10.3 (10e3/uL)   NEUT# 2.1  1.5 - 6.5 (10e3/uL)   HGB 14.5  13.0 - 17.1 (g/dL)   HCT 13.0  86.5 - 78.4 (%)   Platelets 145  140 - 400 (10e3/uL)   MCV 98.8 (*) 79.3 - 98.0 (fL)   MCH 34.9 (*)  27.2 - 33.4 (pg)   MCHC 35.3  32.0 - 36.0 (g/dL)   RBC 1.61 (*) 0.96 - 5.82 (10e6/uL)   RDW 13.5  11.0 - 14.6 (%)   lymph# 0.9  0.9 - 3.3 (10e3/uL)   MONO# 0.3  0.1 - 0.9 (10e3/uL)   Eosinophils Absolute 0.1  0.0 - 0.5 (10e3/uL)   Basophils Absolute 0.0  0.0 - 0.1 (10e3/uL)   NEUT% 61.1  39.0 - 75.0 (%)   LYMPH% 27.6  14.0 - 49.0 (%)   MONO% 8.6  0.0 - 14.0 (%)   EOS% 2.4  0.0 - 7.0 (%)   BASO% 0.3  0.0 - 2.0 (%)   nRBC 0  0 - 0 (%)  UA PROTEIN, DIPSTICK - [facility]      Component Value Range   Protein, ur Negative  Negative- <30 (mg/dL)  PROTIME-INR      Component Value Range   Protime 31.2 (*) 10.6 - 13.4 (Seconds)   INR 2.60  2.00 - 3.50    Lovenox No      IMPRESSION:   61 y/o male with:   1. 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   2. Chronic bursitis, left shoulder 3. Labs OK to treat.  4. INR 2.6, maintain dose of Coumadin.   PLAN:   1. Maintenance Avastin today.  2. Maintain current Coumadin dose. 3. Return 01/21/12 for next Avastin treatment with lab prior and appt with me.  4. Since INR has been stable, we will check INR every 2 weeks with treatment. They wish to minimize trips to the office to allow for travel in between treatments.  5. Keep scheduled appt with ENT for episodic epistaxis.

## 2012-01-10 ENCOUNTER — Encounter: Payer: Self-pay | Admitting: Oncology

## 2012-01-10 NOTE — Progress Notes (Signed)
Ondansetron has been approved through [facility] [employer] from 01/10/12-07/11/12.

## 2012-01-21 ENCOUNTER — Ambulatory Visit (HOSPITAL_BASED_OUTPATIENT_CLINIC_OR_DEPARTMENT_OTHER): Payer: 59

## 2012-01-21 ENCOUNTER — Ambulatory Visit (HOSPITAL_BASED_OUTPATIENT_CLINIC_OR_DEPARTMENT_OTHER): Payer: 59 | Admitting: Family

## 2012-01-21 ENCOUNTER — Encounter: Payer: Self-pay | Admitting: Family

## 2012-01-21 ENCOUNTER — Other Ambulatory Visit (HOSPITAL_BASED_OUTPATIENT_CLINIC_OR_DEPARTMENT_OTHER): Payer: 59 | Admitting: Lab

## 2012-01-21 VITALS — BP 135/85 | HR 66 | Temp 97.8°F | Ht 71.0 in | Wt 166.8 lb

## 2012-01-21 DIAGNOSIS — Z5112 Encounter for antineoplastic immunotherapy: Secondary | ICD-10-CM

## 2012-01-21 DIAGNOSIS — C711 Malignant neoplasm of frontal lobe: Secondary | ICD-10-CM

## 2012-01-21 DIAGNOSIS — Z86718 Personal history of other venous thrombosis and embolism: Secondary | ICD-10-CM

## 2012-01-21 DIAGNOSIS — C719 Malignant neoplasm of brain, unspecified: Secondary | ICD-10-CM

## 2012-01-21 DIAGNOSIS — Z5111 Encounter for antineoplastic chemotherapy: Secondary | ICD-10-CM

## 2012-01-21 DIAGNOSIS — I82409 Acute embolism and thrombosis of unspecified deep veins of unspecified lower extremity: Secondary | ICD-10-CM

## 2012-01-21 LAB — COMPREHENSIVE METABOLIC PANEL
Alkaline Phosphatase: 72 U/L (ref 39–117)
Creatinine, Ser: 0.92 mg/dL (ref 0.50–1.35)
Glucose, Bld: 74 mg/dL (ref 70–99)
Sodium: 139 mEq/L (ref 135–145)
Total Bilirubin: 0.4 mg/dL (ref 0.3–1.2)
Total Protein: 5.9 g/dL — ABNORMAL LOW (ref 6.0–8.3)

## 2012-01-21 LAB — CBC WITH DIFFERENTIAL/PLATELET
Basophils Absolute: 0 10*3/uL (ref 0.0–0.1)
EOS%: 2.4 % (ref 0.0–7.0)
MCH: 35.1 pg — ABNORMAL HIGH (ref 27.2–33.4)
MCV: 99.5 fL — ABNORMAL HIGH (ref 79.3–98.0)
MONO%: 6.2 % (ref 0.0–14.0)
RBC: 3.85 10*6/uL — ABNORMAL LOW (ref 4.20–5.82)
RDW: 13.6 % (ref 11.0–14.6)
nRBC: 0 % (ref 0–0)

## 2012-01-21 LAB — PROTIME-INR: Protime: 31.2 Seconds — ABNORMAL HIGH (ref 10.6–13.4)

## 2012-01-21 MED ORDER — SODIUM CHLORIDE 0.9 % IJ SOLN
10.0000 mL | INTRAMUSCULAR | Status: DC | PRN
  Administered 2012-01-21: 10 mL
  Filled 2012-01-21: qty 10

## 2012-01-21 MED ORDER — SODIUM CHLORIDE 0.9 % IV SOLN: Freq: Once | INTRAVENOUS | Status: DC

## 2012-01-21 MED ORDER — HEPARIN SOD (PORK) LOCK FLUSH 100 UNIT/ML IV SOLN
500.0000 [IU] | Freq: Once | INTRAVENOUS | Status: AC | PRN
  Administered 2012-01-21: 500 [IU]
  Filled 2012-01-21: qty 5

## 2012-01-21 MED ORDER — SODIUM CHLORIDE 0.9 % IV SOLN
10.0000 mg/kg | Freq: Once | INTRAVENOUS | Status: AC
  Administered 2012-01-21: 725 mg via INTRAVENOUS
  Filled 2012-01-21: qty 29

## 2012-01-21 NOTE — Patient Instructions (Addendum)
[facility] Discharge Instructions for Patients Receiving Chemotherapy  Today you received the following chemotherapy agents:  Avastin  To help prevent nausea and vomiting after your treatment, we encourage you to take your nausea medication as ordered per MD.    If you develop nausea and vomiting that is not controlled by your nausea medication, call the clinic. If it is after clinic hours your family physician or the after hours number for the clinic or go to the Emergency Department.   BELOW ARE SYMPTOMS THAT SHOULD BE REPORTED IMMEDIATELY:  *FEVER GREATER THAN 100.5 F  *CHILLS WITH OR WITHOUT FEVER  NAUSEA AND VOMITING THAT IS NOT CONTROLLED WITH YOUR NAUSEA MEDICATION  *UNUSUAL SHORTNESS OF BREATH  *UNUSUAL BRUISING OR BLEEDING  TENDERNESS IN MOUTH AND THROAT WITH OR WITHOUT PRESENCE OF ULCERS  *URINARY PROBLEMS  *BOWEL PROBLEMS  UNUSUAL RASH Items with * indicate a potential emergency and should be followed up as soon as possible.  . Please let the nurse know about any problems that you may have experienced. Feel free to call the clinic you have any questions or concerns. The clinic phone number is [phone].   I have been informed and understand all the instructions given to me. I know to contact the clinic, my physician, or go to the Emergency Department if any problems should occur. I do not have any questions at this time, but understand that I may call the clinic during office hours   should I have any questions or need assistance in obtaining follow up care.    __________________________________________  _____________  __________ Signature of Patient or Authorized Representative            Date                   Time    __________________________________________ Nurse's Signature    

## 2012-01-21 NOTE — Progress Notes (Signed)
[facility]  Name: Roger Raymond                  DATE: 01/21/2012 MRN: [id number]                      DOB: [birth year]  CC: Hewitt Shorts, M.D.  Fara Chute, MD  DIAGNOSIS: Patient Active Problem List   Diagnosis Date Noted  . DVT (deep venous thrombosis) 07/08/2011  . 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 Whole brain radiation therapy with radiosensitizing Temodar at 75 mg/m2 for the duration of the radiation.  2. Temodar adjuvantly for six months given at 175 mg/m2 days one through five on a 28-day cycle for a total of six cycles. 3. Irinotecan and Avastin given on a weekly basis following [facility] protocol for 6 months. 4. Maintenance Avastin.every 2 weeks with Temodar every month on days 1 through 5.  5. Coumadin 10 mg alternating with 12.5 mg  CURRENT THERAPY: Maintenance Avastin every 2 weeks with Temodar every month on days 1-5.   INTERIM HISTORY: Feels remarkably well, leaving in 2 weeks for fishing trip to [place] with his son. Just returned from [place], experienced swelling in the right leg after the trip. No pain.   Has appt with ENT in 2 weeks for chronic sinus drainage, worse in the mornings.   No balance or coordination difficulties. No vision changes. No seizure-like activity, remains on Keppra 500 mg twice daily. Takes Temodar 280 mg days 1-5 of cycle. Takes Coumadin 10 mg Mon, Wed and Friday, 12.5 mg Sunday, Tues, Thurs and Saturday. No abnormal bruising or bleeding.   No cough or shortness of breath. No abdominal pain or new bone pain. Bowel and bladder function are normal. Appetite is good, with adequate fluid intake. Remainder of the 10 point  review of systems is negative.  PHYSICAL EXAM: BP 135/85  Pulse 66  Temp 97.8 F (36.6 C) (Oral)  Ht 5\' 11"  (1.803 m)  Wt 166 lb 12.8 oz (75.66 kg)  BMI 23.26 kg/m2 General: Well developed, well nourished, in no acute distress.  EENT: No ocular or oral  lesions. No stomatitis.  Respiratory: Lungs are clear to auscultation bilaterally with normal respiratory movement and no accessory muscle use. Cardiac: No murmur, rub or tachycardia. No upper or lower extremity edema.  GI: Abdomen is soft, no palpable hepatosplenomegaly. No fluid wave. No tenderness. Musculoskeletal: No kyphosis, no tenderness over the spine, ribs or hips. Lymph: No cervical, infraclavicular, axillary or inguinal adenopathy. Neuro: No focal neurological deficits. Psych: Alert and oriented X 3, appropriate mood and affect.   LABORATORY STUDIES:   Results for orders placed in visit on 01/21/12  CBC WITH DIFFERENTIAL      Component Value Range   WBC 4.5  4.0 - 10.3 10e3/uL   NEUT# 3.4  1.5 - 6.5 10e3/uL   HGB 13.5  13.0 - 17.1 g/dL   HCT 40.9 (*) 81.1 - 91.4 %   Platelets 152  140 - 400 10e3/uL   MCV 99.5 (*) 79.3 - 98.0 fL   MCH 35.1 (*) 27.2 - 33.4 pg   MCHC 35.2  32.0 - 36.0 g/dL   RBC 7.82 (*) 9.56 - 2.13 10e6/uL   RDW 13.6  11.0 - 14.6 %   lymph# 0.8 (*) 0.9 - 3.3 10e3/uL   MONO# 0.3  0.1 - 0.9 10e3/uL   Eosinophils Absolute 0.1  0.0 - 0.5 10e3/uL   Basophils Absolute 0.0  0.0 - 0.1 10e3/uL   NEUT% 74.6  39.0 - 75.0 %   LYMPH% 16.6  14.0 - 49.0 %   MONO% 6.2  0.0 - 14.0 %   EOS% 2.4  0.0 - 7.0 %   BASO% 0.2  0.0 - 2.0 %   nRBC 0  0 - 0 %  UA PROTEIN, DIPSTICK - [facility]      Component Value Range   Protein, ur Negative  Negative- <30 mg/dL  PROTIME-INR      Component Value Range   Protime 31.2 (*) 10.6 - 13.4 Seconds   INR 2.60  2.00 - 3.50   Lovenox No      IMPRESSION:  61 y/o male with:   1. 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   2. Chronic bursitis, left shoulder 3. Labs OK to treat.  4. INR 2.6, maintain dose of Coumadin.   PLAN:   1. Maintenance Avastin today.  2. Maintain current Coumadin dose. 3. Keep scheduled appt with ENT for episodic epistaxis and chronic sinus drainage  DISCUSSION: He wishes  to discuss new blood thinner with Dr. Welton Flakes. Also questions the necessity of Lovenox on the trip to [place]. We will change the 02/03/12 appt with me to Dr. Welton Flakes so he can get his questions answered. This is discussed with Dr. Welton Flakes and she agrees.

## 2012-02-03 ENCOUNTER — Other Ambulatory Visit: Payer: 59 | Admitting: Lab

## 2012-02-03 ENCOUNTER — Ambulatory Visit (HOSPITAL_BASED_OUTPATIENT_CLINIC_OR_DEPARTMENT_OTHER): Payer: 59 | Admitting: Oncology

## 2012-02-03 ENCOUNTER — Encounter: Payer: Self-pay | Admitting: Oncology

## 2012-02-03 ENCOUNTER — Ambulatory Visit (HOSPITAL_BASED_OUTPATIENT_CLINIC_OR_DEPARTMENT_OTHER): Payer: 59

## 2012-02-03 VITALS — BP 113/79 | HR 76 | Temp 97.9°F | Ht 71.0 in | Wt 163.1 lb

## 2012-02-03 DIAGNOSIS — Z7901 Long term (current) use of anticoagulants: Secondary | ICD-10-CM

## 2012-02-03 DIAGNOSIS — Z86718 Personal history of other venous thrombosis and embolism: Secondary | ICD-10-CM

## 2012-02-03 DIAGNOSIS — C711 Malignant neoplasm of frontal lobe: Secondary | ICD-10-CM

## 2012-02-03 DIAGNOSIS — I82409 Acute embolism and thrombosis of unspecified deep veins of unspecified lower extremity: Secondary | ICD-10-CM

## 2012-02-03 DIAGNOSIS — I2699 Other pulmonary embolism without acute cor pulmonale: Secondary | ICD-10-CM

## 2012-02-03 DIAGNOSIS — Z5112 Encounter for antineoplastic immunotherapy: Secondary | ICD-10-CM

## 2012-02-03 LAB — CBC WITH DIFFERENTIAL/PLATELET
Basophils Absolute: 0 10*3/uL (ref 0.0–0.1)
Eosinophils Absolute: 0.1 10*3/uL (ref 0.0–0.5)
HCT: 42.8 % (ref 38.4–49.9)
MCH: 35.6 pg — ABNORMAL HIGH (ref 27.2–33.4)
MCHC: 35.3 g/dL (ref 32.0–36.0)
MONO%: 7.3 % (ref 0.0–14.0)
RBC: 4.24 10*6/uL (ref 4.20–5.82)
lymph#: 0.8 10*3/uL — ABNORMAL LOW (ref 0.9–3.3)

## 2012-02-03 LAB — PROTIME-INR: Protime: 18 Seconds — ABNORMAL HIGH (ref 10.6–13.4)

## 2012-02-03 LAB — COMPREHENSIVE METABOLIC PANEL
ALT: 40 U/L (ref 0–53)
Albumin: 4.6 g/dL (ref 3.5–5.2)
CO2: 26 mEq/L (ref 19–32)
Calcium: 9.4 mg/dL (ref 8.4–10.5)
Chloride: 104 mEq/L (ref 96–112)
Glucose, Bld: 77 mg/dL (ref 70–99)
Sodium: 139 mEq/L (ref 135–145)
Total Bilirubin: 0.5 mg/dL (ref 0.3–1.2)
Total Protein: 6.6 g/dL (ref 6.0–8.3)

## 2012-02-03 LAB — UA PROTEIN, DIPSTICK - CHCC: Protein, ur: NEGATIVE mg/dL

## 2012-02-03 MED ORDER — HEPARIN SOD (PORK) LOCK FLUSH 100 UNIT/ML IV SOLN
500.0000 [IU] | Freq: Once | INTRAVENOUS | Status: AC | PRN
  Administered 2012-02-03: 500 [IU]
  Filled 2012-02-03: qty 5

## 2012-02-03 MED ORDER — SODIUM CHLORIDE 0.9 % IJ SOLN
10.0000 mL | INTRAMUSCULAR | Status: DC | PRN
  Administered 2012-02-03: 10 mL
  Filled 2012-02-03: qty 10

## 2012-02-03 MED ORDER — SODIUM CHLORIDE 0.9 % IV SOLN
Freq: Once | INTRAVENOUS | Status: AC
  Administered 2012-02-03: 13:00:00 via INTRAVENOUS

## 2012-02-03 MED ORDER — SODIUM CHLORIDE 0.9 % IV SOLN
10.0000 mg/kg | Freq: Once | INTRAVENOUS | Status: AC
  Administered 2012-02-03: 725 mg via INTRAVENOUS
  Filled 2012-02-03: qty 29

## 2012-02-03 NOTE — Patient Instructions (Signed)
Call MD for problems 

## 2012-02-03 NOTE — Progress Notes (Signed)
OFFICE PROGRESS NOTE  Dr. 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 The tumor chemotherapy consisted of Temodar 75 mg per meter squared.  #2 patient was then started on Temodar given days 1 through 5 every 28 days with concomitant Avastin given every 2 weeks.  #3 patient also developed lower extremity DVT as well as as pulmonary embolism he was started on Coumadin he is maintained on this at 12.5 mg daily to try to maintain an INR between 2 and 2.5.  CURRENT THERAPY: Patient is here for his scheduled Avastin.  INTERVAL HISTORY: Roger Raymond 61 y.o. male returns for followup visit today for his treatment. Overall he is doing well. He does tell me that with his last Avastin he did develop a headache requiring Tylenol periodically. But the headache only lasted for about 2 or 3 days. It has now subsided. He has otherwise no nausea or vomiting or gait difficulty or word finding difficulty or seizure-like activity. He has no bleeding problems. On occasion he does develop a little bit of swelling in his left lower extremity because of a DVT that he has experienced before. Patient has no chest pains or palpitations or shortness of breath. He is planning on going to [place] for her to week fishing expedition. And he knows what to expect on this trip and how to manage his treatment. Overall this individual is doing remarkably well and I am pleased with his overall progress. Remainder of the 10 point review of systems is negative.  MEDICAL HISTORY: Past Medical History  Diagnosis Date  . 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 DVT (deep  venous thrombosis) 07/08/2011    ALLERGIES:   has no known allergies.  MEDICATIONS:  Current Outpatient Prescriptions  Medication Sig Dispense Refill  . acetaminophen (TYLENOL) 325 MG tablet Take 650 mg by mouth every 6 (six) hours as needed.        . chlorproMAZINE (THORAZINE) 25 MG tablet Take 25 mg by mouth every 6 (six) hours as needed.        . diphenoxylate-atropine (LOMOTIL) 2.5-0.025 MG per tablet Take 1 tablet by mouth 4 (four) times daily as needed. 1-2 tabs prn       . levETIRAcetam (KEPPRA) 500 MG tablet Take 500 mg by mouth 2 (two) times daily.        [name] lidocaine-prilocaine (EMLA) cream Apply topically as needed. Apply to port 1-2 hours before procedure       . ondansetron (ZOFRAN) 8 MG tablet Take 1 tablet (8 mg total) by mouth every 12 (twelve) hours as needed.  234 tablet  1  . sodium chloride (OCEAN) 0.65 % nasal spray Place 1 spray into the nose as needed.      . temozolomide (TEMODAR) 250 MG capsule Take 280 mg by mouth daily. May take on an empty stomach or at bedtime to decrease nausea & vomiting.  Take x5 days then off  x3 wks      . warfarin (COUMADIN) 10 MG tablet Take 10 mg by mouth daily. 10mg  MWF, 12.5 all others   04/15/11       . warfarin (COUMADIN)  5 MG tablet Take 1 tablet (5 mg total) by mouth daily.  64 tablet  1    SURGICAL HISTORY:  Past Surgical History  Procedure Date  . Vasectomy     REVIEW OF SYSTEMS:  Pertinent items are noted in HPI.   PHYSICAL EXAMINATION: General appearance: alert, cooperative and appears stated age Head: Normocephalic, without obvious abnormality, atraumatic Neck: no adenopathy, no carotid bruit, no JVD, supple, symmetrical, trachea midline and thyroid not enlarged, symmetric, no tenderness/mass/nodules Lymph nodes: Cervical, supraclavicular, and axillary nodes normal. Resp: clear to auscultation bilaterally and normal percussion bilaterally Back: symmetric, no curvature. ROM normal. No CVA tenderness. Cardio: regular rate  and rhythm, S1, S2 normal, no murmur, click, rub or gallop and normal apical impulse GI: soft, non-tender; bowel sounds normal; no masses,  no organomegaly Extremities: extremities normal, atraumatic, no cyanosis or edema Neurologic: Alert and oriented X 3, normal strength and tone. Normal symmetric reflexes. Normal coordination and gait  ECOG PERFORMANCE STATUS: 0 - Asymptomatic  Blood pressure 113/79, pulse 76, temperature 97.9 F (36.6 C), temperature source Oral, height 5\' 11"  (1.803 m), weight 163 lb 1.6 oz (73.982 kg).  LABORATORY DATA: Lab Results  Component Value Date   WBC 4.3 02/03/2012   HGB 15.1 02/03/2012   HCT 42.8 02/03/2012   MCV 100.9* 02/03/2012   PLT 144 02/03/2012      Chemistry      Component Value Date/Time   NA 139 01/21/2012 1039   K 4.1 01/21/2012 1039   CL 105 01/21/2012 1039   CO2 30 01/21/2012 1039   BUN 12 01/21/2012 1039   CREATININE 0.92 01/21/2012 1039      Component Value Date/Time   CALCIUM 8.8 01/21/2012 1039   ALKPHOS 72 01/21/2012 1039   AST 26 01/21/2012 1039   ALT 35 01/21/2012 1039   BILITOT 0.4 01/21/2012 1039       RADIOGRAPHIC STUDIES:  No results found.  ASSESSMENT: 61 year old gentleman with:  1.  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 Overall he is doing well and tolerating his therapy quite well. He also is getting  Avastin and tolerating it very well.  2.  For his pulmonary embolus he is on Coumadin and he is very therapeutic with his INR. We are checking his INR is at [facility] with the results being faxed to [place] here at [facility].  #3 patient is planning on going to [place] for about 2 weeks starting on Thursday, July 4. However he will return prior to his next scheduled Avastin.   PLAN:   1. We will proceed with scheduled Avastin And she will return in 2 weeks' time in followup for his next dose of Avastin.  #2 since patient is going away to [place] I will plan on  having him hold the Temodar for now until his return in that we certainly can resume his Temodar. I do think this would be the safest since he sometimes does drop his blood counts with the chemotherapy.  #3 we will also switching to Lovenox on a daily basis. He will discontinue the Coumadin. His INR is only 1.5 today so I do think it is appropriate for him to go on full dose of Lovenox at this time. Once he returns we will then convert him back to Coumadin.  #4 patient knows to call me with any problems during his travels.  All questions were answered. The patient knows to  call the clinic with any problems, questions or concerns. We can certainly see the patient much sooner if necessary.  I spent >25 minutes counseling the patient face to face. The total time spent in the appointment was 30 minutes.    Marcy Panning, MD Medical/Oncology [facility] [phone] (beeper) [phone] (Office)  02/03/2012, 12:15 PM

## 2012-02-03 NOTE — Patient Instructions (Addendum)
1. Proceed with avastin today,  2. Hold temodar for the trip  3. Begin lovenox for the duration of the [place] trip and hold coumadin  4. I will see you back on 02/17/12

## 2012-02-04 ENCOUNTER — Other Ambulatory Visit: Payer: Self-pay | Admitting: Certified Registered Nurse Anesthetist

## 2012-02-17 ENCOUNTER — Ambulatory Visit (HOSPITAL_BASED_OUTPATIENT_CLINIC_OR_DEPARTMENT_OTHER): Payer: 59 | Admitting: Oncology

## 2012-02-17 ENCOUNTER — Encounter: Payer: Self-pay | Admitting: Oncology

## 2012-02-17 ENCOUNTER — Other Ambulatory Visit (HOSPITAL_BASED_OUTPATIENT_CLINIC_OR_DEPARTMENT_OTHER): Payer: 59 | Admitting: Lab

## 2012-02-17 ENCOUNTER — Ambulatory Visit (HOSPITAL_BASED_OUTPATIENT_CLINIC_OR_DEPARTMENT_OTHER): Payer: 59

## 2012-02-17 VITALS — BP 134/89 | HR 68 | Temp 96.9°F

## 2012-02-17 VITALS — BP 135/76 | HR 67 | Temp 97.7°F | Ht 71.0 in | Wt 164.4 lb

## 2012-02-17 DIAGNOSIS — Z5112 Encounter for antineoplastic immunotherapy: Secondary | ICD-10-CM

## 2012-02-17 DIAGNOSIS — I2699 Other pulmonary embolism without acute cor pulmonale: Secondary | ICD-10-CM

## 2012-02-17 DIAGNOSIS — C711 Malignant neoplasm of frontal lobe: Secondary | ICD-10-CM

## 2012-02-17 DIAGNOSIS — I82409 Acute embolism and thrombosis of unspecified deep veins of unspecified lower extremity: Secondary | ICD-10-CM

## 2012-02-17 LAB — CBC WITH DIFFERENTIAL/PLATELET
BASO%: 0.3 % (ref 0.0–2.0)
Basophils Absolute: 0 10*3/uL (ref 0.0–0.1)
EOS%: 3.5 % (ref 0.0–7.0)
Eosinophils Absolute: 0.1 10*3/uL (ref 0.0–0.5)
HCT: 39.5 % (ref 38.4–49.9)
HGB: 13.8 g/dL (ref 13.0–17.1)
LYMPH%: 23.2 % (ref 14.0–49.0)
MCH: 34.9 pg — ABNORMAL HIGH (ref 27.2–33.4)
MCHC: 34.9 g/dL (ref 32.0–36.0)
MCV: 100 fL — ABNORMAL HIGH (ref 79.3–98.0)
MONO#: 0.3 10*3/uL (ref 0.1–0.9)
MONO%: 7.4 % (ref 0.0–14.0)
NEUT#: 2.4 10*3/uL (ref 1.5–6.5)
NEUT%: 65.6 % (ref 39.0–75.0)
Platelets: 166 10*3/uL (ref 140–400)
RBC: 3.95 10*6/uL — ABNORMAL LOW (ref 4.20–5.82)
RDW: 13.3 % (ref 11.0–14.6)
WBC: 3.7 10*3/uL — ABNORMAL LOW (ref 4.0–10.3)
lymph#: 0.9 10*3/uL (ref 0.9–3.3)

## 2012-02-17 LAB — PROTIME-INR
INR: 0.9 — ABNORMAL LOW (ref 2.00–3.50)
Protime: 10.8 Seconds (ref 10.6–13.4)

## 2012-02-17 LAB — COMPREHENSIVE METABOLIC PANEL
Alkaline Phosphatase: 131 U/L — ABNORMAL HIGH (ref 39–117)
CO2: 26 mEq/L (ref 19–32)
Creatinine, Ser: 0.95 mg/dL (ref 0.50–1.35)
Glucose, Bld: 122 mg/dL — ABNORMAL HIGH (ref 70–99)
Total Bilirubin: 0.4 mg/dL (ref 0.3–1.2)

## 2012-02-17 LAB — UA PROTEIN, DIPSTICK - CHCC: Protein, ur: NEGATIVE mg/dL

## 2012-02-17 MED ORDER — SODIUM CHLORIDE 0.9 % IV SOLN: Freq: Once | INTRAVENOUS | Status: DC

## 2012-02-17 MED ORDER — HEPARIN SOD (PORK) LOCK FLUSH 100 UNIT/ML IV SOLN
500.0000 [IU] | Freq: Once | INTRAVENOUS | Status: AC | PRN
  Administered 2012-02-17: 500 [IU]
  Filled 2012-02-17: qty 5

## 2012-02-17 MED ORDER — SODIUM CHLORIDE 0.9 % IV SOLN
10.0000 mg/kg | Freq: Once | INTRAVENOUS | Status: AC
  Administered 2012-02-17: 725 mg via INTRAVENOUS
  Filled 2012-02-17: qty 29

## 2012-02-17 MED ORDER — SODIUM CHLORIDE 0.9 % IJ SOLN
10.0000 mL | INTRAMUSCULAR | Status: DC | PRN
  Administered 2012-02-17: 10 mL
  Filled 2012-02-17: qty 10

## 2012-02-17 NOTE — Patient Instructions (Addendum)
Bevacizumab injection What is this medicine? BEVACIZUMAB (be va SIZ yoo mab) is a chemotherapy drug. It targets a protein found in many cancer cell types, and halts cancer growth. This drug treats many cancers including non-small cell lung cancer, and colon or rectal cancer. It is usually given with other chemotherapy drugs. This medicine may be used for other purposes; ask your health care provider or pharmacist if you have questions. What should I tell my health care provider before I take this medicine? They need to know if you have any of these conditions: -blood clots -heart disease, including heart failure, heart attack, or chest pain (angina) -high blood pressure -infection (especially a virus infection such as chickenpox, cold sores, or herpes) -kidney disease -lung disease -prior chemotherapy with doxorubicin, daunorubicin, epirubicin, or other anthracycline type chemotherapy agents -recent or ongoing radiation therapy -recent surgery -stroke -an unusual or allergic reaction to bevacizumab, hamster proteins, mouse proteins, other medicines, foods, dyes, or preservatives -pregnant or trying to get pregnant -breast-feeding How should I use this medicine? This medicine is for infusion into a vein. It is given by a health care professional in a hospital or clinic setting. Talk to your pediatrician regarding the use of this medicine in children. Special care may be needed. Overdosage: If you think you have taken too much of this medicine contact a poison control center or emergency room at once. NOTE: This medicine is only for you. Do not share this medicine with others. What if I miss a dose? It is important not to miss your dose. Call your doctor or health care professional if you are unable to keep an appointment. What may interact with this medicine? Interactions are not expected. This list may not describe all possible interactions. Give your health care provider a list of all  the medicines, herbs, non-prescription drugs, or dietary supplements you use. Also tell them if you smoke, drink alcohol, or use illegal drugs. Some items may interact with your medicine. What should I watch for while using this medicine? Your condition will be monitored carefully while you are receiving this medicine. You will need important blood work and urine testing done while you are taking this medicine. During your treatment, let your health care professional know if you have any unusual symptoms, such as difficulty breathing. This medicine may rarely cause 'gastrointestinal perforation' (holes in the stomach, intestines or colon), a serious side effect requiring surgery to repair. This medicine should be started at least 28 days following major surgery and the site of the surgery should be totally healed. Check with your doctor before scheduling dental work or surgery while you are receiving this treatment. Talk to your doctor if you have recently had surgery or if you have a wound that has not healed. Do not become pregnant while taking this medicine. Women should inform their doctor if they wish to become pregnant or think they might be pregnant. There is a potential for serious side effects to an unborn child. Talk to your health care professional or pharmacist for more information. Do not breast-feed an infant while taking this medicine. This medicine has caused ovarian failure in some women. This medicine may interfere with the ability to have a child. You should talk to your doctor or health care professional if you are concerned about your fertility. What side effects may I notice from receiving this medicine? Side effects that you should report to your doctor or health care professional as soon as possible: -allergic reactions like skin   rash, itching or hives, swelling of the face, lips, or tongue -signs of infection - fever or chills, cough, sore throat, pain or trouble passing  urine -signs of decreased platelets or bleeding - bruising, pinpoint red spots on the skin, black, tarry stools, nosebleeds, blood in the urine -breathing problems -changes in vision -chest pain -confusion -jaw pain, especially after dental work -mouth sores -seizures -severe abdominal pain -severe headache -sudden numbness or weakness of the face, arm or leg -swelling of legs or ankles -symptoms of a stroke: change in mental awareness, inability to talk or move one side of the body (especially in patients with lung cancer) -trouble passing urine or change in the amount of urine -trouble speaking or understanding -trouble walking, dizziness, loss of balance or coordination Side effects that usually do not require medical attention (report to your doctor or health care professional if they continue or are bothersome): -constipation -diarrhea -dry skin -headache -loss of appetite -nausea, vomiting This list may not describe all possible side effects. Call your doctor for medical advice about side effects. You may report side effects to FDA at 1-800-FDA-1088. Where should I keep my medicine? This drug is given in a hospital or clinic and will not be stored at home. NOTE: This sheet is a summary. It may not cover all possible information. If you have questions about this medicine, talk to your doctor, pharmacist, or health care provider.  2012, [employer]. (06/22/2010 4:25:37 PM) 

## 2012-02-17 NOTE — Patient Instructions (Addendum)
1. Proceed with avastin and temodar today.  2. Convert to coumadin start coumadin at 10 mg daily with lovenox, check INR on 7/22 and then we will make the adjustments

## 2012-02-17 NOTE — Progress Notes (Signed)
OFFICE PROGRESS NOTE  Dr. 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 The tumor chemotherapy consisted of Temodar 75 mg per meter squared.  #2 patient was then started on Temodar given days 1 through 5 every 28 days with concomitant Avastin given every 2 weeks.  #3 patient also developed lower extremity DVT as well as as pulmonary embolism he was started on Coumadin he is maintained on this at 12.5 mg daily to try to maintain an INR between 2 and 2.5.  CURRENT THERAPY: Patient is here for his scheduled Avastin.  INTERVAL HISTORY: Roger Raymond 61 y.o. male returns for followup visit today for his treatment. He returned from [place] just yesterday. He had a very successful trip. I am not sure how mini fishy call at but he seems very pleased. From his perspective of health he did very well. He denies any fevers chills night sweats headaches shortness of breath chest pains palpitations no myalgias and arthralgias no bleeding no swelling in his lower extremities. Remainder of the 10 point review of systems is negative. MEDICAL HISTORY: Past Medical History  Diagnosis Date  . 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 DVT (deep venous thrombosis) 07/08/2011    ALLERGIES:   has no known allergies.  MEDICATIONS:  Current Outpatient Prescriptions  Medication Sig Dispense Refill  . acetaminophen (TYLENOL) 325 MG tablet Take 650 mg by mouth every 6 (six) hours as needed.        . chlorproMAZINE (THORAZINE) 25 MG tablet Take 25 mg by mouth every 6 (six) hours as needed.        . diphenoxylate-atropine (LOMOTIL) 2.5-0.025 MG per tablet  Take 1 tablet by mouth 4 (four) times daily as needed. 1-2 tabs prn       . levETIRAcetam (KEPPRA) 500 MG tablet Take 500 mg by mouth 2 (two) times daily.        [name] lidocaine-prilocaine (EMLA) cream Apply topically as needed. Apply to port 1-2 hours before procedure       . ondansetron (ZOFRAN) 8 MG tablet Take 1 tablet (8 mg total) by mouth every 12 (twelve) hours as needed.  234 tablet  1  . sodium chloride (OCEAN) 0.65 % nasal spray Place 1 spray into the nose as needed.      . temozolomide (TEMODAR) 250 MG capsule Take 280 mg by mouth daily. May take on an empty stomach or at bedtime to decrease nausea & vomiting.  Take x5 days then off  x3 wks      . warfarin (COUMADIN) 10 MG tablet Take 10 mg by mouth daily. 10mg  MWF, 12.5 all others   04/15/11       . warfarin (COUMADIN) 5 MG tablet Take 1 tablet (5 mg total) by mouth daily.  64 tablet  1    SURGICAL HISTORY:  Past Surgical History  Procedure Date  . Vasectomy     REVIEW OF SYSTEMS:  Pertinent items are noted in HPI.   PHYSICAL EXAMINATION: General appearance: alert, cooperative and appears stated age Head: Normocephalic, without obvious abnormality, atraumatic Neck: no adenopathy, no carotid bruit, no JVD, supple, symmetrical, trachea midline and  thyroid not enlarged, symmetric, no tenderness/mass/nodules Lymph nodes: Cervical, supraclavicular, and axillary nodes normal. Resp: clear to auscultation bilaterally and normal percussion bilaterally Back: symmetric, no curvature. ROM normal. No CVA tenderness. Cardio: regular rate and rhythm, S1, S2 normal, no murmur, click, rub or gallop and normal apical impulse GI: soft, non-tender; bowel sounds normal; no masses,  no organomegaly Extremities: extremities normal, atraumatic, no cyanosis or edema Neurologic: Alert and oriented X 3, normal strength and tone. Normal symmetric reflexes. Normal coordination and gait  ECOG PERFORMANCE STATUS: 0 - Asymptomatic  Blood pressure 135/76,  pulse 67, temperature 97.7 F (36.5 C), temperature source Oral, height 5\' 11"  (1.803 m), weight 164 lb 6.4 oz (74.571 kg).  LABORATORY DATA: Lab Results  Component Value Date   WBC 3.7* 02/17/2012   HGB 13.8 02/17/2012   HCT 39.5 02/17/2012   MCV 100.0* 02/17/2012   PLT 166 02/17/2012      Chemistry      Component Value Date/Time   NA 139 02/03/2012 1100   K 4.9 02/03/2012 1100   CL 104 02/03/2012 1100   CO2 26 02/03/2012 1100   BUN 18 02/03/2012 1100   CREATININE 0.97 02/03/2012 1100      Component Value Date/Time   CALCIUM 9.4 02/03/2012 1100   ALKPHOS 87 02/03/2012 1100   AST 28 02/03/2012 1100   ALT 40 02/03/2012 1100   BILITOT 0.5 02/03/2012 1100       RADIOGRAPHIC STUDIES:  No results found.  ASSESSMENT: 61 year old gentleman with:  1.  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 Overall he is doing well and tolerating his therapy quite well. He also is getting  Avastin and tolerating it very well.  2.  For his pulmonary embolus he is on Coumadin and he is very therapeutic with his INR. We are checking his INR is at [facility] with the results being faxed to [place] here at [facility].   PLAN:   1. We will proceed with scheduled Avastin And she will return in 2 weeks' time in followup for his next dose of Avastin.  #2 Patient will also begin Temodar days 1 through 5 starting today..  #3 he will also convert from Lovenox to Coumadin. He will start Coumadin at 10 mg daily. We will check his INR in 1 week's time and if it is therapeutic and we will discontinue the Lovenox.  All questions were answered. The patient knows to call the clinic with any problems, questions or concerns. We can certainly see the patient much sooner if necessary.  I spent >25 minutes counseling the patient face to face. The total time spent in the appointment was 30 minutes.    Marcy Panning, MD Medical/Oncology [facility] [phone]  (beeper) [phone] (Office)  02/17/2012, 12:56 PM

## 2012-02-24 ENCOUNTER — Telehealth: Payer: Self-pay | Admitting: *Deleted

## 2012-02-24 NOTE — Telephone Encounter (Signed)
Millie with [facility] called requesting lab order for cbc, PT/INR for this patient.  He has a weekly standing order dated 05-29-2012 which has expired.  Asked for a new order to be faxed to [phone].  Will notify providers.

## 2012-02-25 ENCOUNTER — Other Ambulatory Visit: Payer: Self-pay | Admitting: Medical Oncology

## 2012-02-26 ENCOUNTER — Telehealth: Payer: Self-pay | Admitting: Medical Oncology

## 2012-02-26 NOTE — Telephone Encounter (Signed)
LMOVM, per MD, patient to stop taking Lovenox and begin taking 10 mg Coumadin MWF, 15 mg Coumadin all other days.  Instructed patient to return call confirming instructions and with any questions.

## 2012-03-02 ENCOUNTER — Other Ambulatory Visit (HOSPITAL_BASED_OUTPATIENT_CLINIC_OR_DEPARTMENT_OTHER): Payer: 59

## 2012-03-02 ENCOUNTER — Other Ambulatory Visit: Payer: Self-pay | Admitting: Medical Oncology

## 2012-03-02 ENCOUNTER — Ambulatory Visit (HOSPITAL_BASED_OUTPATIENT_CLINIC_OR_DEPARTMENT_OTHER): Payer: 59 | Admitting: Oncology

## 2012-03-02 ENCOUNTER — Ambulatory Visit (HOSPITAL_BASED_OUTPATIENT_CLINIC_OR_DEPARTMENT_OTHER): Payer: 59

## 2012-03-02 VITALS — BP 114/79 | HR 62 | Temp 97.5°F | Ht 71.0 in | Wt 162.7 lb

## 2012-03-02 DIAGNOSIS — Z5112 Encounter for antineoplastic immunotherapy: Secondary | ICD-10-CM

## 2012-03-02 DIAGNOSIS — C711 Malignant neoplasm of frontal lobe: Secondary | ICD-10-CM

## 2012-03-02 DIAGNOSIS — Z7901 Long term (current) use of anticoagulants: Secondary | ICD-10-CM

## 2012-03-02 DIAGNOSIS — I82409 Acute embolism and thrombosis of unspecified deep veins of unspecified lower extremity: Secondary | ICD-10-CM

## 2012-03-02 DIAGNOSIS — I2699 Other pulmonary embolism without acute cor pulmonale: Secondary | ICD-10-CM

## 2012-03-02 DIAGNOSIS — R609 Edema, unspecified: Secondary | ICD-10-CM

## 2012-03-02 LAB — COMPREHENSIVE METABOLIC PANEL
ALT: 65 U/L — ABNORMAL HIGH (ref 0–53)
AST: 39 U/L — ABNORMAL HIGH (ref 0–37)
Albumin: 4.5 g/dL (ref 3.5–5.2)
Alkaline Phosphatase: 130 U/L — ABNORMAL HIGH (ref 39–117)
BUN: 25 mg/dL — ABNORMAL HIGH (ref 6–23)
Creatinine, Ser: 1.05 mg/dL (ref 0.50–1.35)
Potassium: 4.5 mEq/L (ref 3.5–5.3)

## 2012-03-02 LAB — CBC WITH DIFFERENTIAL/PLATELET
BASO%: 0.2 % (ref 0.0–2.0)
EOS%: 3.5 % (ref 0.0–7.0)
HCT: 41.3 % (ref 38.4–49.9)
MCH: 34.5 pg — ABNORMAL HIGH (ref 27.2–33.4)
MCHC: 35.1 g/dL (ref 32.0–36.0)
MONO#: 0.3 10*3/uL (ref 0.1–0.9)
NEUT%: 66.3 % (ref 39.0–75.0)
RBC: 4.2 10*6/uL (ref 4.20–5.82)
WBC: 4 10*3/uL (ref 4.0–10.3)
lymph#: 0.9 10*3/uL (ref 0.9–3.3)
nRBC: 0 % (ref 0–0)

## 2012-03-02 LAB — PROTIME-INR: INR: 1.6 — ABNORMAL LOW (ref 2.00–3.50)

## 2012-03-02 MED ORDER — HEPARIN SOD (PORK) LOCK FLUSH 100 UNIT/ML IV SOLN
500.0000 [IU] | Freq: Once | INTRAVENOUS | Status: AC | PRN
  Administered 2012-03-02: 500 [IU]
  Filled 2012-03-02: qty 5

## 2012-03-02 MED ORDER — WARFARIN SODIUM 10 MG PO TABS: ORAL_TABLET | ORAL | Status: DC

## 2012-03-02 MED ORDER — BEVACIZUMAB CHEMO INJECTION 400 MG/16ML
10.0000 mg/kg | Freq: Once | INTRAVENOUS | Status: AC
  Administered 2012-03-02: 725 mg via INTRAVENOUS
  Filled 2012-03-02: qty 29

## 2012-03-02 MED ORDER — SODIUM CHLORIDE 0.9 % IV SOLN
Freq: Once | INTRAVENOUS | Status: AC
  Administered 2012-03-02: 13:00:00 via INTRAVENOUS

## 2012-03-02 MED ORDER — SODIUM CHLORIDE 0.9 % IJ SOLN
10.0000 mL | INTRAMUSCULAR | Status: DC | PRN
  Administered 2012-03-02: 10 mL
  Filled 2012-03-02: qty 10

## 2012-03-02 NOTE — Patient Instructions (Addendum)
Proceed with avastin today  Keep left leg elevated and wear compression hose  Increase coumadin dose to 15 mg alternate with 12 1/2 , recheck INR in 1 week at [facility]

## 2012-03-02 NOTE — Patient Instructions (Signed)
[facility] Discharge Instructions for Patients Receiving Chemotherapy  Today you received the following chemotherapy agents Avastin To help prevent nausea and vomiting after your treatment, we encourage you to take your nausea medication as prescribed.  If you develop nausea and vomiting that is not controlled by your nausea medication, call the clinic. If it is after clinic hours your family physician or the after hours number for the clinic or go to the Emergency Department.   BELOW ARE SYMPTOMS THAT SHOULD BE REPORTED IMMEDIATELY:  *FEVER GREATER THAN 100.5 F  *CHILLS WITH OR WITHOUT FEVER  NAUSEA AND VOMITING THAT IS NOT CONTROLLED WITH YOUR NAUSEA MEDICATION  *UNUSUAL SHORTNESS OF BREATH  *UNUSUAL BRUISING OR BLEEDING  TENDERNESS IN MOUTH AND THROAT WITH OR WITHOUT PRESENCE OF ULCERS  *URINARY PROBLEMS  *BOWEL PROBLEMS  UNUSUAL RASH Items with * indicate a potential emergency and should be followed up as soon as possible.  One of the nurses will contact you 24 hours after your treatment. Please let the nurse know about any problems that you may have experienced. Feel free to call the clinic you have any questions or concerns. The clinic phone number is [phone].   I have been informed and understand all the instructions given to me. I know to contact the clinic, my physician, or go to the Emergency Department if any problems should occur. I do not have any questions at this time, but understand that I may call the clinic during office hours   should I have any questions or need assistance in obtaining follow up care.    __________________________________________  _____________  __________ Signature of Patient or Authorized Representative            Date                   Time    __________________________________________ Nurse's Signature    

## 2012-03-02 NOTE — Progress Notes (Signed)
OFFICE PROGRESS NOTE  Dr. 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 The tumor chemotherapy consisted of Temodar 75 mg per meter squared.  #2 patient was then started on Temodar given days 1 through 5 every 28 days with concomitant Avastin given every 2 weeks.  #3 patient also developed lower extremity DVT as well as as pulmonary embolism he was started on Coumadin he is maintained on this at 12.5 mg daily to try to maintain an INR between 2 and 2.5.  CURRENT THERAPY: Patient is here for his scheduled Avastin.  INTERVAL HISTORY: Roger Raymond 61 y.o. male returns for followup visit today for his treatment. Primary complaint is slight swelling of the left lower extremity with discoloration of the skin which happens when patient has been on his feet for quite a long time and his INR is not therapeutic at this time either. Otherwise he feels fine he has no bleeding problems. He denies any nausea vomiting abdominal pain diarrhea or constipation or any bleeding. He has no dizziness headaches weakness or fatigue. Remainder of the 10 point review of systems is negative.  MEDICAL HISTORY: Past Medical History  Diagnosis Date  . 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 DVT (deep venous thrombosis) 07/08/2011    ALLERGIES:   has no known allergies.  MEDICATIONS:  Current Outpatient Prescriptions  Medication Sig Dispense Refill  . acetaminophen (TYLENOL) 325 MG tablet Take 650 mg by mouth every 6 (six) hours as needed.        . chlorproMAZINE (THORAZINE) 25 MG tablet Take 25 mg by mouth every 6 (six) hours as needed.        .  diphenoxylate-atropine (LOMOTIL) 2.5-0.025 MG per tablet Take 1 tablet by mouth 4 (four) times daily as needed. 1-2 tabs prn       . levETIRAcetam (KEPPRA) 500 MG tablet Take 500 mg by mouth 2 (two) times daily.        [name] lidocaine-prilocaine (EMLA) cream Apply topically as needed. Apply to port 1-2 hours before procedure       . ondansetron (ZOFRAN) 8 MG tablet Take 1 tablet (8 mg total) by mouth every 12 (twelve) hours as needed.  234 tablet  1  . sodium chloride (OCEAN) 0.65 % nasal spray Place 1 spray into the nose as needed.      . temozolomide (TEMODAR) 250 MG capsule Take 280 mg by mouth daily. May take on an empty stomach or at bedtime to decrease nausea & vomiting.  Take x5 days then off  x3 wks      . warfarin (COUMADIN) 5 MG tablet Take 1 tablet (5 mg total) by mouth daily.  64 tablet  1  . DISCONTD: warfarin (COUMADIN) 10 MG tablet Take 10 mg by mouth daily. 10mg  MWF, 12.5 all others   04/15/11       . warfarin (COUMADIN) 10 MG tablet 10mg  MWF, 15 all others   03/02/2012  30 tablet  2    SURGICAL HISTORY:  Past Surgical History  Procedure Date  . Vasectomy     REVIEW OF SYSTEMS:  Pertinent items are noted in HPI.  PHYSICAL EXAMINATION: General appearance: alert, cooperative and appears stated age Head: Normocephalic, without obvious abnormality, atraumatic Neck: no adenopathy, no carotid bruit, no JVD, supple, symmetrical, trachea midline and thyroid not enlarged, symmetric, no tenderness/mass/nodules Lymph nodes: Cervical, supraclavicular, and axillary nodes normal. Resp: clear to auscultation bilaterally and normal percussion bilaterally Back: symmetric, no curvature. ROM normal. No CVA tenderness. Cardio: regular rate and rhythm, S1, S2 normal, no murmur, click, rub or gallop and normal apical impulse GI: soft, non-tender; bowel sounds normal; no masses,  no organomegaly Extremities: extremities normal, atraumatic, no cyanosis or edema Neurologic: Alert and oriented X 3,  normal strength and tone. Normal symmetric reflexes. Normal coordination and gait  ECOG PERFORMANCE STATUS: 0 - Asymptomatic  Blood pressure 114/79, pulse 62, temperature 97.5 F (36.4 C), temperature source Oral, height 5\' 11"  (1.803 m), weight 162 lb 11.2 oz (73.8 kg).  LABORATORY DATA: Lab Results  Component Value Date   WBC 4.0 03/02/2012   HGB 14.5 03/02/2012   HCT 41.3 03/02/2012   MCV 98.3* 03/02/2012   PLT 189 03/02/2012      Chemistry      Component Value Date/Time   NA 139 02/17/2012 1231   K 3.9 02/17/2012 1231   CL 101 02/17/2012 1231   CO2 26 02/17/2012 1231   BUN 18 02/17/2012 1231   CREATININE 0.95 02/17/2012 1231      Component Value Date/Time   CALCIUM 9.3 02/17/2012 1231   ALKPHOS 131* 02/17/2012 1231   AST 58* 02/17/2012 1231   ALT 102* 02/17/2012 1231   BILITOT 0.4 02/17/2012 1231       RADIOGRAPHIC STUDIES:  No results found.  ASSESSMENT: 61 year old gentleman with:  1.  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 Overall he is doing well and tolerating his therapy quite well. He also is getting  Avastin and tolerating it very well.  2.  For his pulmonary embolus he is on Coumadin and he is very therapeutic with his INR. We are checking his INR is at [facility] with the results being faxed to [place] here at [facility].   PLAN:   1. We will proceed with scheduled Avastin And he will return in 2 weeks' time in followup for his next dose of Avastin.  #2 INR is still therapeutic we will increase his dose of Coumadin to 15 mg alternating with 12-1/2 mg. Another INR in 1 week's time. This check will be done in [place].  #3 left lower extremity edema secondary to history of DVT he is recommended to wear her Compression hose as well as keep his feet elevated  #4 he will return in 2 weeks' time for his next dose of Avastin or  All questions were answered. The patient knows to call the clinic with any problems,  questions or concerns. We can certainly see the patient much sooner if necessary.  I spent >25 minutes counseling the patient face to face. The total time spent in the appointment was 30 minutes.    Marcy Panning, MD Medical/Oncology [facility] [phone] (beeper) [phone] (Office)  03/02/2012, 12:33 PM

## 2012-03-16 ENCOUNTER — Ambulatory Visit (HOSPITAL_BASED_OUTPATIENT_CLINIC_OR_DEPARTMENT_OTHER): Payer: 59 | Admitting: Oncology

## 2012-03-16 ENCOUNTER — Encounter: Payer: Self-pay | Admitting: Oncology

## 2012-03-16 ENCOUNTER — Other Ambulatory Visit (HOSPITAL_BASED_OUTPATIENT_CLINIC_OR_DEPARTMENT_OTHER): Payer: 59 | Admitting: Lab

## 2012-03-16 ENCOUNTER — Ambulatory Visit (HOSPITAL_BASED_OUTPATIENT_CLINIC_OR_DEPARTMENT_OTHER): Payer: 59

## 2012-03-16 VITALS — BP 116/84 | HR 120 | Temp 98.1°F | Resp 20 | Ht 71.0 in | Wt 163.8 lb

## 2012-03-16 DIAGNOSIS — C719 Malignant neoplasm of brain, unspecified: Secondary | ICD-10-CM

## 2012-03-16 DIAGNOSIS — Z86711 Personal history of pulmonary embolism: Secondary | ICD-10-CM

## 2012-03-16 DIAGNOSIS — Z5112 Encounter for antineoplastic immunotherapy: Secondary | ICD-10-CM

## 2012-03-16 DIAGNOSIS — C711 Malignant neoplasm of frontal lobe: Secondary | ICD-10-CM

## 2012-03-16 DIAGNOSIS — Z86718 Personal history of other venous thrombosis and embolism: Secondary | ICD-10-CM

## 2012-03-16 LAB — CBC WITH DIFFERENTIAL/PLATELET
Basophils Absolute: 0 10*3/uL (ref 0.0–0.1)
EOS%: 4.3 % (ref 0.0–7.0)
HGB: 14.9 g/dL (ref 13.0–17.1)
MCH: 34.3 pg — ABNORMAL HIGH (ref 27.2–33.4)
NEUT#: 2.2 10*3/uL (ref 1.5–6.5)
RDW: 13.3 % (ref 11.0–14.6)
lymph#: 1.1 10*3/uL (ref 0.9–3.3)

## 2012-03-16 LAB — UA PROTEIN, DIPSTICK - CHCC: Protein, ur: NEGATIVE mg/dL

## 2012-03-16 LAB — PROTIME-INR: Protime: 36 Seconds — ABNORMAL HIGH (ref 10.6–13.4)

## 2012-03-16 MED ORDER — SODIUM CHLORIDE 0.9 % IV SOLN
10.0000 mg/kg | Freq: Once | INTRAVENOUS | Status: AC
  Administered 2012-03-16: 725 mg via INTRAVENOUS
  Filled 2012-03-16: qty 29

## 2012-03-16 MED ORDER — HEPARIN SOD (PORK) LOCK FLUSH 100 UNIT/ML IV SOLN
500.0000 [IU] | Freq: Once | INTRAVENOUS | Status: AC | PRN
  Administered 2012-03-16: 500 [IU]
  Filled 2012-03-16: qty 5

## 2012-03-16 MED ORDER — SODIUM CHLORIDE 0.9 % IJ SOLN
10.0000 mL | INTRAMUSCULAR | Status: DC | PRN
  Administered 2012-03-16: 10 mL
  Filled 2012-03-16: qty 10

## 2012-03-16 MED ORDER — SODIUM CHLORIDE 0.9 % IV SOLN
Freq: Once | INTRAVENOUS | Status: AC
  Administered 2012-03-16: 13:00:00 via INTRAVENOUS

## 2012-03-16 NOTE — Patient Instructions (Addendum)
Proceed with avastin today  Dermatology evaluation for the lesion on the nose  Change coumadin to 12.5 mg daily and recheck INR in 1 week at [place]

## 2012-03-16 NOTE — Progress Notes (Signed)
OFFICE PROGRESS NOTE  Dr. 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 The tumor chemotherapy consisted of Temodar 75 mg per meter squared.  #2 patient was then started on Temodar given days 1 through 5 every 28 days with concomitant Avastin given every 2 weeks.  #3 patient also developed lower extremity DVT as well as as pulmonary embolism he was started on Coumadin he is maintained on this at 12.5 mg daily to try to maintain an INR between 2 and 2.5.  CURRENT THERAPY: Patient is here for his scheduled Avastin.  INTERVAL HISTORY: Roger Raymond 61 y.o. male returns for followup visit today for his treatment. Overall he seems to be doing well. He does have an area on the bridge of his nose that has been bleeding off-and-on question is whether this is due to the Avastin or if it is something else that is going on. I have recommended that he be seen by dermatology and he will make these arrangements on his own at [facility]. He otherwise denies any fevers chills night sweats headaches. His left lower extremity looks much better his INR today was 3.0 which is significantly improved and a bit higher than I would want it to be and we discussed changing his Coumadin dosing as well. Remainder of the 10 point review of systems is negative.  MEDICAL HISTORY: Past Medical History  Diagnosis Date  . 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 DVT (deep venous thrombosis) 07/08/2011    ALLERGIES:   has no known allergies.  MEDICATIONS:  Current Outpatient Prescriptions  Medication Sig Dispense Refill  . acetaminophen (TYLENOL) 325 MG tablet Take  650 mg by mouth every 6 (six) hours as needed.        . chlorproMAZINE (THORAZINE) 25 MG tablet Take 25 mg by mouth every 6 (six) hours as needed.        . diphenoxylate-atropine (LOMOTIL) 2.5-0.025 MG per tablet Take 1 tablet by mouth 4 (four) times daily as needed. 1-2 tabs prn       . levETIRAcetam (KEPPRA) 500 MG tablet Take 500 mg by mouth 2 (two) times daily.        [name] lidocaine-prilocaine (EMLA) cream Apply topically as needed. Apply to port 1-2 hours before procedure       . ondansetron (ZOFRAN) 8 MG tablet Take 1 tablet (8 mg total) by mouth every 12 (twelve) hours as needed.  234 tablet  1  . sodium chloride (OCEAN) 0.65 % nasal spray Place 1 spray into the nose as needed.      . temozolomide (TEMODAR) 250 MG capsule Take 280 mg by mouth daily. May take on an empty stomach or at bedtime to decrease nausea & vomiting.  Take x5 days then off  x3 wks      . warfarin (COUMADIN) 10 MG tablet 10mg  MWF, 15 all others   03/02/2012  30 tablet  2  . warfarin (COUMADIN) 5 MG tablet Take 1 tablet (5 mg total) by mouth daily.  64 tablet  1    SURGICAL HISTORY:  Past Surgical History  Procedure Date  . Vasectomy  REVIEW OF SYSTEMS:  Pertinent items are noted in HPI.   PHYSICAL EXAMINATION: General appearance: alert, cooperative and appears stated age Head: Normocephalic, without obvious abnormality, atraumatic Neck: no adenopathy, no carotid bruit, no JVD, supple, symmetrical, trachea midline and thyroid not enlarged, symmetric, no tenderness/mass/nodules Lymph nodes: Cervical, supraclavicular, and axillary nodes normal. Resp: clear to auscultation bilaterally and normal percussion bilaterally Back: symmetric, no curvature. ROM normal. No CVA tenderness. Cardio: regular rate and rhythm, S1, S2 normal, no murmur, click, rub or gallop and normal apical impulse GI: soft, non-tender; bowel sounds normal; no masses,  no organomegaly Extremities: extremities normal, atraumatic, no cyanosis or  edema Neurologic: Alert and oriented X 3, normal strength and tone. Normal symmetric reflexes. Normal coordination and gait  ECOG PERFORMANCE STATUS: 0 - Asymptomatic  Blood pressure 116/84, pulse 120, temperature 98.1 F (36.7 C), temperature source Oral, resp. rate 20, height 5\' 11"  (1.803 m), weight 163 lb 12.8 oz (74.299 kg).  LABORATORY DATA: Lab Results  Component Value Date   WBC 3.7* 03/16/2012   HGB 14.9 03/16/2012   HCT 42.5 03/16/2012   MCV 97.9 03/16/2012   PLT 145 03/16/2012      Chemistry      Component Value Date/Time   NA 138 03/02/2012 1100   K 4.5 03/02/2012 1100   CL 102 03/02/2012 1100   CO2 23 03/02/2012 1100   BUN 25* 03/02/2012 1100   CREATININE 1.05 03/02/2012 1100      Component Value Date/Time   CALCIUM 9.5 03/02/2012 1100   ALKPHOS 130* 03/02/2012 1100   AST 39* 03/02/2012 1100   ALT 65* 03/02/2012 1100   BILITOT 0.4 03/02/2012 1100       RADIOGRAPHIC STUDIES:  No results found.  ASSESSMENT: 61 year old gentleman with:  1.  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 Overall he is doing well and tolerating his therapy quite well. He also is getting  Avastin and tolerating it very well.  2.  For his pulmonary embolus he is on Coumadin and he is very therapeutic with his INR. We are checking his INR is at [facility] with the [facility] [place] [facility]   PLAN:   1. We will proceed with scheduled Avastin And he will return in 2 weeks' time in followup for his next dose of Avastin.  #3 left lower extremity edema secondary to history of DVT he is recommended to wear her Compression hose as well as keep his feet elevated. INR today is 3.0. I have recommended that he take Coumadin 12.5 mg daily. He will have his INR checked at [facility] in one week's time.  #4 he will return in 2 weeks' time for his next dose of Avastin   All questions were answered. The patient knows to  call the clinic with any problems, questions or concerns. We can certainly see the patient much sooner if necessary.  I spent >25 minutes counseling the patient face to face. The total time spent in the appointment was 30 minutes.    Marcy Panning, MD Medical/Oncology [facility] [phone] (beeper) [phone] (Office)  03/16/2012, 12:35 PM

## 2012-03-16 NOTE — Patient Instructions (Addendum)
[facility] Discharge Instructions for Patients Receiving Chemotherapy  Today you received the following chemotherapy agents Avastin  To help prevent nausea and vomiting after your treatment, we encourage you to take your nausea medication as ordered. .   If you develop nausea and vomiting that is not controlled by your nausea medication, call the clinic. If it is after clinic hours call  the after hours number for the clinic or go to the Emergency Department.   BELOW ARE SYMPTOMS THAT SHOULD BE REPORTED IMMEDIATELY:  *FEVER GREATER THAN 100.5 F  *CHILLS WITH OR WITHOUT FEVER  NAUSEA AND VOMITING THAT IS NOT CONTROLLED WITH YOUR NAUSEA MEDICATION  *UNUSUAL SHORTNESS OF BREATH  *UNUSUAL BRUISING OR BLEEDING  TENDERNESS IN MOUTH AND THROAT WITH OR WITHOUT PRESENCE OF ULCERS  *URINARY PROBLEMS  *BOWEL PROBLEMS  UNUSUAL RASH Items with * indicate a potential emergency and should be followed up as soon as possible.  One of the nurses will contact you 24 hours after your treatment. Please let the nurse know about any problems that you may have experienced. Feel free to call the clinic you have any questions or concerns. The clinic phone number is [phone].   I have been informed and understand all the instructions given to me. I know to contact the clinic, my physician, or go to the Emergency Department if any problems should occur. I do not have any questions at this time, but understand that I may call the clinic during office hours   should I have any questions or need assistance in obtaining follow up care.    __________________________________________  _____________  __________ Signature of Patient or Authorized Representative            Date                   Time    __________________________________________ Nurse's Signature

## 2012-03-23 ENCOUNTER — Telehealth: Payer: Self-pay | Admitting: *Deleted

## 2012-03-23 NOTE — Telephone Encounter (Signed)
Keep same dose and recheck in 1 week

## 2012-03-23 NOTE — Telephone Encounter (Signed)
Pt called with lab results from [facility]. PT 30.2     INR 2.7  curretnly taking 12.5mg  coumadin daily  WBC 4.0   Hgb 15.0   PLT 165 Will check with [facility] for hard copy. F/U appt 8/26 Will revie with MD

## 2012-03-24 ENCOUNTER — Encounter: Payer: Self-pay | Admitting: Oncology

## 2012-03-24 ENCOUNTER — Encounter: Payer: Self-pay | Admitting: Family Medicine

## 2012-03-24 NOTE — Telephone Encounter (Signed)
Per MD, Pt to continue Coumadin 12.5mg  daily. Recheck labs in 1 week

## 2012-03-27 ENCOUNTER — Encounter: Payer: Self-pay | Admitting: Oncology

## 2012-03-30 ENCOUNTER — Encounter: Payer: Self-pay | Admitting: Oncology

## 2012-03-30 ENCOUNTER — Other Ambulatory Visit (HOSPITAL_BASED_OUTPATIENT_CLINIC_OR_DEPARTMENT_OTHER): Payer: 59 | Admitting: Lab

## 2012-03-30 ENCOUNTER — Ambulatory Visit (HOSPITAL_BASED_OUTPATIENT_CLINIC_OR_DEPARTMENT_OTHER): Payer: 59 | Admitting: Oncology

## 2012-03-30 ENCOUNTER — Ambulatory Visit (HOSPITAL_BASED_OUTPATIENT_CLINIC_OR_DEPARTMENT_OTHER): Payer: 59

## 2012-03-30 VITALS — BP 127/84 | HR 69 | Temp 98.3°F | Resp 20 | Ht 71.0 in | Wt 163.2 lb

## 2012-03-30 DIAGNOSIS — Z5112 Encounter for antineoplastic immunotherapy: Secondary | ICD-10-CM

## 2012-03-30 DIAGNOSIS — I82409 Acute embolism and thrombosis of unspecified deep veins of unspecified lower extremity: Secondary | ICD-10-CM

## 2012-03-30 DIAGNOSIS — C711 Malignant neoplasm of frontal lobe: Secondary | ICD-10-CM

## 2012-03-30 DIAGNOSIS — I2699 Other pulmonary embolism without acute cor pulmonale: Secondary | ICD-10-CM

## 2012-03-30 LAB — COMPREHENSIVE METABOLIC PANEL (CC13)
ALT: 69 U/L — ABNORMAL HIGH (ref 0–55)
Albumin: 4 g/dL (ref 3.5–5.0)
Alkaline Phosphatase: 120 U/L (ref 40–150)
Glucose: 88 mg/dl (ref 70–99)
Potassium: 4.2 mEq/L (ref 3.5–5.1)
Sodium: 142 mEq/L (ref 136–145)
Total Protein: 6.4 g/dL (ref 6.4–8.3)

## 2012-03-30 LAB — CBC WITH DIFFERENTIAL/PLATELET
BASO%: 0.3 % (ref 0.0–2.0)
EOS%: 2 % (ref 0.0–7.0)
MCH: 34.5 pg — ABNORMAL HIGH (ref 27.2–33.4)
MCHC: 35.6 g/dL (ref 32.0–36.0)
NEUT%: 74.1 % (ref 39.0–75.0)
RDW: 13.2 % (ref 11.0–14.6)
lymph#: 0.7 10*3/uL — ABNORMAL LOW (ref 0.9–3.3)

## 2012-03-30 LAB — PROTIME-INR: Protime: 46.8 Seconds — ABNORMAL HIGH (ref 10.6–13.4)

## 2012-03-30 MED ORDER — PREDNISONE 5 MG PO TABS: 5.0000 mg | ORAL_TABLET | Freq: Two times a day (BID) | ORAL | Status: AC

## 2012-03-30 MED ORDER — SODIUM CHLORIDE 0.9 % IJ SOLN
10.0000 mL | INTRAMUSCULAR | Status: DC | PRN
  Administered 2012-03-30: 10 mL
  Filled 2012-03-30: qty 10

## 2012-03-30 MED ORDER — HEPARIN SOD (PORK) LOCK FLUSH 100 UNIT/ML IV SOLN
500.0000 [IU] | Freq: Once | INTRAVENOUS | Status: AC | PRN
  Administered 2012-03-30: 500 [IU]
  Filled 2012-03-30: qty 5

## 2012-03-30 MED ORDER — SODIUM CHLORIDE 0.9 % IV SOLN
Freq: Once | INTRAVENOUS | Status: AC
  Administered 2012-03-30: 12:00:00 via INTRAVENOUS

## 2012-03-30 MED ORDER — SODIUM CHLORIDE 0.9 % IV SOLN
10.0000 mg/kg | Freq: Once | INTRAVENOUS | Status: AC
  Administered 2012-03-30: 725 mg via INTRAVENOUS
  Filled 2012-03-30: qty 29

## 2012-03-30 NOTE — Progress Notes (Signed)
OFFICE PROGRESS NOTE  Dr. 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 The tumor chemotherapy consisted of Temodar 75 mg per meter squared.  #2 patient was then started on Temodar given days 1 through 5 every 28 days with concomitant Avastin given every 2 weeks.  #3 patient also developed lower extremity DVT as well as as pulmonary embolism he was started on Coumadin to try to maintain an INR between 2 and 2.5.  CURRENT THERAPY: Patient is here for his scheduled Avastin.  INTERVAL HISTORY: Roger Raymond 61 y.o. male returns for followup visit today for his treatment. Overall he seems to be doing well.his INR is elevated and we wil adjust his coumadin doses. He has no evidence of bleeding, no there issues reported. He is looking forward to his trip to [place] today. Remainder of the 10 point review of systems is negative.  MEDICAL HISTORY: Past Medical History  Diagnosis Date  . 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 DVT (deep venous thrombosis) 07/08/2011    ALLERGIES:   has no known allergies.  MEDICATIONS:  Current Outpatient Prescriptions  Medication Sig Dispense Refill  . acetaminophen (TYLENOL) 325 MG tablet Take 650 mg by mouth every 6 (six) hours as needed.        . chlorproMAZINE (THORAZINE) 25 MG tablet Take 25 mg by mouth every 6 (six) hours as needed.        . diphenoxylate-atropine (LOMOTIL) 2.5-0.025 MG per tablet Take 1 tablet by mouth 4 (four) times daily as needed. 1-2 tabs prn       . levETIRAcetam (KEPPRA) 500 MG tablet Take 500 mg by mouth 2 (two) times daily.        [name] lidocaine-prilocaine (EMLA) cream  Apply topically as needed. Apply to port 1-2 hours before procedure       . ondansetron (ZOFRAN) 8 MG tablet Take 1 tablet (8 mg total) by mouth every 12 (twelve) hours as needed.  234 tablet  1  . sodium chloride (OCEAN) 0.65 % nasal spray Place 1 spray into the nose as needed.      . temozolomide (TEMODAR) 250 MG capsule Take 280 mg by mouth daily. May take on an empty stomach or at bedtime to decrease nausea & vomiting.  Take x5 days then off  x3 wks      . warfarin (COUMADIN) 10 MG tablet 10mg  MWF, 15 all others   03/02/2012  30 tablet  2  . warfarin (COUMADIN) 5 MG tablet Take 1 tablet (5 mg total) by mouth daily.  64 tablet  1  . predniSONE (DELTASONE) 5 MG tablet Take 1 tablet (5 mg total) by mouth 2 (two) times daily.  20 tablet  0   No current facility-administered medications for this visit.   Facility-Administered Medications Ordered in Other Visits  Medication Dose Route Frequency Provider Last Rate Last Dose  . 0.9 %  sodium chloride infusion   Intravenous Once Deatra Robinson, MD      . bevacizumab (AVASTIN) 725 mg in sodium chloride 0.9 % 100 mL chemo infusion  10 mg/kg (Treatment Plan Actual) Intravenous Once Deatra Robinson, MD  725 mg at 03/30/12 1256  . heparin lock flush 100 unit/mL  500 Units Intracatheter Once PRN Victorino December, MD   500 Units at 03/30/12 1322  . DISCONTD: sodium chloride 0.9 % injection 10 mL  10 mL Intracatheter PRN Victorino December, MD   10 mL at 03/30/12 1322    SURGICAL HISTORY:  Past Surgical History  Procedure Date  . Vasectomy     REVIEW OF SYSTEMS:  Pertinent items are noted in HPI.   PHYSICAL EXAMINATION: General appearance: alert, cooperative and appears stated age Head: Normocephalic, without obvious abnormality, atraumatic Neck: no adenopathy, no carotid bruit, no JVD, supple, symmetrical, trachea midline and thyroid not enlarged, symmetric, no tenderness/mass/nodules Lymph nodes: Cervical, supraclavicular, and axillary nodes  normal. Resp: clear to auscultation bilaterally and normal percussion bilaterally Back: symmetric, no curvature. ROM normal. No CVA tenderness. Cardio: regular rate and rhythm, S1, S2 normal, no murmur, click, rub or gallop and normal apical impulse GI: soft, non-tender; bowel sounds normal; no masses,  no organomegaly Extremities: extremities normal, atraumatic, no cyanosis or edema Neurologic: Alert and oriented X 3, normal strength and tone. Normal symmetric reflexes. Normal coordination and gait  ECOG PERFORMANCE STATUS: 0 - Asymptomatic  Blood pressure 127/84, pulse 69, temperature 98.3 F (36.8 C), temperature source Oral, resp. rate 20, height 5\' 11"  (1.803 m), weight 163 lb 3.2 oz (74.027 kg).  LABORATORY DATA: Lab Results  Component Value Date   WBC 3.9* 03/30/2012   HGB 14.3 03/30/2012   HCT 40.2 03/30/2012   MCV 97.1 03/30/2012   PLT 172 03/30/2012      Chemistry      Component Value Date/Time   NA 142 03/30/2012 1031   NA 138 03/02/2012 1100   K 4.2 03/30/2012 1031   K 4.5 03/02/2012 1100   CL 107 03/30/2012 1031   CL 102 03/02/2012 1100   CO2 25 03/30/2012 1031   CO2 23 03/02/2012 1100   BUN 20.0 03/30/2012 1031   BUN 25* 03/02/2012 1100   CREATININE 0.9 03/30/2012 1031   CREATININE 1.05 03/02/2012 1100      Component Value Date/Time   CALCIUM 8.9 03/30/2012 1031   CALCIUM 9.5 03/02/2012 1100   ALKPHOS 120 03/30/2012 1031   ALKPHOS 130* 03/02/2012 1100   AST 47* 03/30/2012 1031   AST 39* 03/02/2012 1100   ALT 69* 03/30/2012 1031   ALT 65* 03/02/2012 1100   BILITOT 0.60 03/30/2012 1031   BILITOT 0.4 03/02/2012 1100       RADIOGRAPHIC STUDIES:  No results found.  ASSESSMENT: 61 year old gentleman with:  1.  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 Overall he is doing well and tolerating his therapy quite well. He also is getting  Avastin and tolerating it very well.  2.  For his pulmonary embolus he is on  Coumadin and he is very therapeutic with his INR. We are checking his INR is at [facility] with the results being faxed to [place] here at cancer center.  3. Patient is going on a cruise with wife to [place] we discussed monitoring of INR and bleeding   PLAN:   1. We will proceed with scheduled Avastin And he will return in 2 weeks' time in followup for his next dose of Avastin.  #3 left lower extremity edema secondary to history of DVT he is recommended to wear her Compression hose as well as keep his feet elevated.  INR today is 3.9. I have recommended that he take Coumadin 12.5 mg daily alternate with 10 mg. He will have his INR checked at [facility] in 2 week's time.  #4 he will return in 2 weeks' time for his next dose of Avastin   All questions were answered. The patient knows to call the clinic with any problems, questions or concerns. We can certainly see the patient much sooner if necessary.  I spent >25 minutes counseling the patient face to face. The total time spent in the appointment was 30 minutes.    Marcy Panning, MD Medical/Oncology [facility] [phone] (beeper) [phone] (Office)  03/30/2012, 7:43 PM

## 2012-03-30 NOTE — Patient Instructions (Signed)
[facility] Discharge Instructions for Patients Receiving Chemotherapy  Today you received the following chemotherapy agents AVASTIN To help prevent nausea and vomiting after your treatment, we encourage you to take your nausea medication as directed.  If you develop nausea and vomiting that is not controlled by your nausea medication, call the clinic. If it is after clinic hours your family physician or the after hours number for the clinic or go to the Emergency Department.   BELOW ARE SYMPTOMS THAT SHOULD BE REPORTED IMMEDIATELY:  *FEVER GREATER THAN 100.5 F  *CHILLS WITH OR WITHOUT FEVER  NAUSEA AND VOMITING THAT IS NOT CONTROLLED WITH YOUR NAUSEA MEDICATION  *UNUSUAL SHORTNESS OF BREATH  *UNUSUAL BRUISING OR BLEEDING  TENDERNESS IN MOUTH AND THROAT WITH OR WITHOUT PRESENCE OF ULCERS  *URINARY PROBLEMS  *BOWEL PROBLEMS  UNUSUAL RASH Items with * indicate a potential emergency and should be followed up as soon as possible.  One of the nurses will contact you 24 hours after your treatment. Please let the nurse know about any problems that you may have experienced. Feel free to call the clinic you have any questions or concerns. The clinic phone number is [phone].   I have been informed and understand all the instructions given to me. I know to contact the clinic, my physician, or go to the Emergency Department if any problems should occur. I do not have any questions at this time, but understand that I may call the clinic during office hours   should I have any questions or need assistance in obtaining follow up care.    __________________________________________  _____________  __________ Signature of Patient or Authorized Representative            Date                   Time    __________________________________________ Nurse's Signature

## 2012-03-30 NOTE — Patient Instructions (Addendum)
1. Reduce coumadin to 10 mg alternate with 12.5 mg  2. Monitor for bleeding

## 2012-04-13 ENCOUNTER — Other Ambulatory Visit (HOSPITAL_BASED_OUTPATIENT_CLINIC_OR_DEPARTMENT_OTHER): Payer: 59 | Admitting: Lab

## 2012-04-13 ENCOUNTER — Telehealth: Payer: Self-pay | Admitting: *Deleted

## 2012-04-13 ENCOUNTER — Ambulatory Visit (HOSPITAL_BASED_OUTPATIENT_CLINIC_OR_DEPARTMENT_OTHER): Payer: 59

## 2012-04-13 ENCOUNTER — Ambulatory Visit (HOSPITAL_BASED_OUTPATIENT_CLINIC_OR_DEPARTMENT_OTHER): Payer: 59 | Admitting: Oncology

## 2012-04-13 ENCOUNTER — Encounter: Payer: Self-pay | Admitting: Oncology

## 2012-04-13 VITALS — BP 126/90 | HR 69 | Temp 97.6°F | Resp 20 | Ht 71.0 in | Wt 164.7 lb

## 2012-04-13 DIAGNOSIS — C711 Malignant neoplasm of frontal lobe: Secondary | ICD-10-CM

## 2012-04-13 DIAGNOSIS — Z86711 Personal history of pulmonary embolism: Secondary | ICD-10-CM

## 2012-04-13 DIAGNOSIS — I82409 Acute embolism and thrombosis of unspecified deep veins of unspecified lower extremity: Secondary | ICD-10-CM

## 2012-04-13 DIAGNOSIS — M7989 Other specified soft tissue disorders: Secondary | ICD-10-CM

## 2012-04-13 DIAGNOSIS — Z5112 Encounter for antineoplastic immunotherapy: Secondary | ICD-10-CM

## 2012-04-13 LAB — CBC WITH DIFFERENTIAL/PLATELET
BASO%: 0.5 % (ref 0.0–2.0)
EOS%: 2.1 % (ref 0.0–7.0)
HCT: 40.9 % (ref 38.4–49.9)
MCH: 34.5 pg — ABNORMAL HIGH (ref 27.2–33.4)
MCHC: 35 g/dL (ref 32.0–36.0)
MONO#: 0.3 10*3/uL (ref 0.1–0.9)
NEUT%: 66.9 % (ref 39.0–75.0)
RBC: 4.15 10*6/uL — ABNORMAL LOW (ref 4.20–5.82)
RDW: 13.4 % (ref 11.0–14.6)
WBC: 4.3 10*3/uL (ref 4.0–10.3)
lymph#: 1 10*3/uL (ref 0.9–3.3)
nRBC: 0 % (ref 0–0)

## 2012-04-13 LAB — COMPREHENSIVE METABOLIC PANEL (CC13)
Albumin: 3.7 g/dL (ref 3.5–5.0)
Alkaline Phosphatase: 83 U/L (ref 40–150)
BUN: 16 mg/dL (ref 7.0–26.0)
Calcium: 8.7 mg/dL (ref 8.4–10.4)
Creatinine: 1 mg/dL (ref 0.7–1.3)
Glucose: 81 mg/dl (ref 70–99)
Potassium: 4.3 mEq/L (ref 3.5–5.1)

## 2012-04-13 LAB — PROTIME-INR
INR: 3.2 (ref 2.00–3.50)
Protime: 38.4 Seconds — ABNORMAL HIGH (ref 10.6–13.4)

## 2012-04-13 MED ORDER — SODIUM CHLORIDE 0.9 % IV SOLN
Freq: Once | INTRAVENOUS | Status: AC
  Administered 2012-04-13: 12:00:00 via INTRAVENOUS

## 2012-04-13 MED ORDER — HEPARIN SOD (PORK) LOCK FLUSH 100 UNIT/ML IV SOLN
500.0000 [IU] | Freq: Once | INTRAVENOUS | Status: AC | PRN
  Administered 2012-04-13: 500 [IU]
  Filled 2012-04-13: qty 5

## 2012-04-13 MED ORDER — SODIUM CHLORIDE 0.9 % IJ SOLN
10.0000 mL | INTRAMUSCULAR | Status: DC | PRN
  Administered 2012-04-13: 10 mL
  Filled 2012-04-13: qty 10

## 2012-04-13 MED ORDER — SODIUM CHLORIDE 0.9 % IV SOLN
10.0000 mg/kg | Freq: Once | INTRAVENOUS | Status: AC
  Administered 2012-04-13: 725 mg via INTRAVENOUS
  Filled 2012-04-13: qty 29

## 2012-04-13 NOTE — Patient Instructions (Addendum)
Change coumadin to 10 mg 4 days a week with other days 12.5 mg  Proceed with avastin  Begin temodar this week x 5 days then stop

## 2012-04-13 NOTE — Patient Instructions (Signed)
[facility] Discharge Instructions for Patients Receiving Chemotherapy  Today you received the following chemotherapy agents Avastin To help prevent nausea and vomiting after your treatment, we encourage you to take your nausea medication as prescribed.  If you develop nausea and vomiting that is not controlled by your nausea medication, call the clinic. If it is after clinic hours your family physician or the after hours number for the clinic or go to the Emergency Department.   BELOW ARE SYMPTOMS THAT SHOULD BE REPORTED IMMEDIATELY:  *FEVER GREATER THAN 100.5 F  *CHILLS WITH OR WITHOUT FEVER  NAUSEA AND VOMITING THAT IS NOT CONTROLLED WITH YOUR NAUSEA MEDICATION  *UNUSUAL SHORTNESS OF BREATH  *UNUSUAL BRUISING OR BLEEDING  TENDERNESS IN MOUTH AND THROAT WITH OR WITHOUT PRESENCE OF ULCERS  *URINARY PROBLEMS  *BOWEL PROBLEMS  UNUSUAL RASH Items with * indicate a potential emergency and should be followed up as soon as possible.  One of the nurses will contact you 24 hours after your treatment. Please let the nurse know about any problems that you may have experienced. Feel free to call the clinic you have any questions or concerns. The clinic phone number is [phone].   I have been informed and understand all the instructions given to me. I know to contact the clinic, my physician, or go to the Emergency Department if any problems should occur. I do not have any questions at this time, but understand that I may call the clinic during office hours   should I have any questions or need assistance in obtaining follow up care.    __________________________________________  _____________  __________ Signature of Patient or Authorized Representative            Date                   Time    __________________________________________ Nurse's Signature    

## 2012-04-13 NOTE — Telephone Encounter (Signed)
avastin on 10/7, 10/21, 11/4, 11/18, 12/2, 12/16  Sent Roger Raymond email to set up avastin on 10/7, 10/21, 11/4, 11/18, 12/2, 12/16

## 2012-04-13 NOTE — Telephone Encounter (Signed)
Per staff message and POF I have scheduled appts.  JMW  

## 2012-04-13 NOTE — Progress Notes (Signed)
OFFICE PROGRESS NOTE  Dr. 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 The tumor chemotherapy consisted of Temodar 75 mg per meter squared.  #2 patient was then started on Temodar given days 1 through 5 every 28 days with concomitant Avastin given every 2 weeks.  #3 patient also developed lower extremity DVT as well as as pulmonary embolism he was started on Coumadin to try to maintain an INR between 2 and 2.5.  CURRENT THERAPY: Patient is here for his scheduled Avastin And he will also begin concurrent Temodar days 1 through 5 every 28 days..  INTERVAL HISTORY: Roger Raymond 61 y.o. male returns for followup visit today for his treatment.Overall he had a great trip to [place]. He did quite well. Today his INR is slightly elevated and we will make adjustments in his Coumadin. He denies any fevers chills night sweats headaches shortness of breath chest pains palpitations no myalgias or arthralgias no bleeding problems no lower extremity swelling. Remainder of the 14 point review of systems is negative.  MEDICAL HISTORY: Past Medical History  Diagnosis Date  . 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 DVT (deep venous thrombosis) 07/08/2011    ALLERGIES:   has no known allergies.  MEDICATIONS:  Current Outpatient Prescriptions  Medication Sig Dispense Refill  . acetaminophen (TYLENOL) 325 MG tablet Take 650 mg by mouth every 6 (six) hours as needed.        . chlorproMAZINE (THORAZINE) 25 MG tablet Take 25 mg by mouth every 6 (six) hours as needed.        . diphenoxylate-atropine (LOMOTIL) 2.5-0.025 MG per tablet Take 1 tablet  by mouth 4 (four) times daily as needed. 1-2 tabs prn       . levETIRAcetam (KEPPRA) 500 MG tablet Take 500 mg by mouth 2 (two) times daily.        [name] lidocaine-prilocaine (EMLA) cream Apply topically as needed. Apply to port 1-2 hours before procedure       . ondansetron (ZOFRAN) 8 MG tablet Take 1 tablet (8 mg total) by mouth every 12 (twelve) hours as needed.  234 tablet  1  . sodium chloride (OCEAN) 0.65 % nasal spray Place 1 spray into the nose as needed.      . temozolomide (TEMODAR) 250 MG capsule Take 280 mg by mouth daily. May take on an empty stomach or at bedtime to decrease nausea & vomiting.  Take x5 days then off  x3 wks      . warfarin (COUMADIN) 10 MG tablet 10mg  MWF, 15 all others   03/02/2012  30 tablet  2  . warfarin (COUMADIN) 5 MG tablet Take 1 tablet (5 mg total) by mouth daily.  64 tablet  1    SURGICAL HISTORY:  Past Surgical History  Procedure Date  . Vasectomy     REVIEW OF SYSTEMS:  Pertinent items are noted in HPI.   PHYSICAL EXAMINATION: General appearance: alert, cooperative and appears stated age Head: Normocephalic, without obvious abnormality, atraumatic Neck: no adenopathy, no carotid bruit, no JVD, supple, symmetrical, trachea midline and thyroid not enlarged, symmetric, no tenderness/mass/nodules Lymph nodes: Cervical, supraclavicular, and  axillary nodes normal. Resp: clear to auscultation bilaterally and normal percussion bilaterally Back: symmetric, no curvature. ROM normal. No CVA tenderness. Cardio: regular rate and rhythm, S1, S2 normal, no murmur, click, rub or gallop and normal apical impulse GI: soft, non-tender; bowel sounds normal; no masses,  no organomegaly Extremities: extremities normal, atraumatic, no cyanosis or edema Neurologic: Alert and oriented X 3, normal strength and tone. Normal symmetric reflexes. Normal coordination and gait  ECOG PERFORMANCE STATUS: 0 - Asymptomatic  Blood pressure 126/90, pulse 69, temperature 97.6 F  (36.4 C), temperature source Oral, resp. rate 20, height 5\' 11"  (1.803 m), weight 164 lb 11.2 oz (74.707 kg).  LABORATORY DATA: Lab Results  Component Value Date   WBC 3.9* 03/30/2012   HGB 14.3 03/30/2012   HCT 40.2 03/30/2012   MCV 97.1 03/30/2012   PLT 172 03/30/2012      Chemistry      Component Value Date/Time   NA 142 03/30/2012 1031   NA 138 03/02/2012 1100   K 4.2 03/30/2012 1031   K 4.5 03/02/2012 1100   CL 107 03/30/2012 1031   CL 102 03/02/2012 1100   CO2 25 03/30/2012 1031   CO2 23 03/02/2012 1100   BUN 20.0 03/30/2012 1031   BUN 25* 03/02/2012 1100   CREATININE 0.9 03/30/2012 1031   CREATININE 1.05 03/02/2012 1100      Component Value Date/Time   CALCIUM 8.9 03/30/2012 1031   CALCIUM 9.5 03/02/2012 1100   ALKPHOS 120 03/30/2012 1031   ALKPHOS 130* 03/02/2012 1100   AST 47* 03/30/2012 1031   AST 39* 03/02/2012 1100   ALT 69* 03/30/2012 1031   ALT 65* 03/02/2012 1100   BILITOT 0.60 03/30/2012 1031   BILITOT 0.4 03/02/2012 1100       RADIOGRAPHIC STUDIES:  No results found.  ASSESSMENT: 61 year old gentleman with:  1.  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 Overall he is doing well and tolerating his therapy quite well. He also is getting  Avastin and tolerating it very well.  2.  For his pulmonary embolus he is on Coumadin and he is very therapeutic with his INR. We are checking his INR is at [facility] with the results being faxed to [place] here at cancer center.  PLAN:   1. We will proceed with scheduled Avastin Temodar days 1 - 5and he will return in 2 weeks' time in followup for his next dose of Avastin.  #3 left lower extremity edema secondary to history of DVT he is recommended to wear her Compression hose as well as keep his feet elevated. INR today is 3.2. I have recommended that he take Coumadin 10 mg 4 days a week with 12.5 mg the other days.[name] He will have his INR checked at [facility] in [place]  time.  #4 he will return in 2 weeks' time for his next dose of Avastin   All questions were answered. The patient knows to call the clinic with any problems, questions or concerns. We can certainly see the patient much sooner if necessary.  I spent >25 minutes counseling the patient face to face. The total time spent in the appointment was 30 minutes.    Marcy Panning, MD Medical/Oncology [facility] [phone] (beeper) [phone] (Office)  04/13/2012, 11:08 AM

## 2012-04-21 ENCOUNTER — Telehealth: Payer: Self-pay | Admitting: *Deleted

## 2012-04-21 NOTE — Telephone Encounter (Signed)
Labs reviewed by MD. Notified pt per MD ,Continue Coumadin 12.5 Tues/Thurs/ Sat/Sun , 10mg  Mon/Wed/Fri Pt verbalized understanding. No further questions

## 2012-04-27 ENCOUNTER — Ambulatory Visit (HOSPITAL_BASED_OUTPATIENT_CLINIC_OR_DEPARTMENT_OTHER): Payer: 59 | Admitting: Oncology

## 2012-04-27 ENCOUNTER — Encounter: Payer: Self-pay | Admitting: Oncology

## 2012-04-27 ENCOUNTER — Ambulatory Visit (HOSPITAL_BASED_OUTPATIENT_CLINIC_OR_DEPARTMENT_OTHER): Payer: 59

## 2012-04-27 ENCOUNTER — Other Ambulatory Visit: Payer: Self-pay | Admitting: Oncology

## 2012-04-27 ENCOUNTER — Other Ambulatory Visit (HOSPITAL_BASED_OUTPATIENT_CLINIC_OR_DEPARTMENT_OTHER): Payer: 59 | Admitting: Lab

## 2012-04-27 VITALS — BP 133/88 | HR 58 | Temp 97.7°F | Resp 20 | Ht 71.0 in | Wt 166.8 lb

## 2012-04-27 DIAGNOSIS — I2699 Other pulmonary embolism without acute cor pulmonale: Secondary | ICD-10-CM

## 2012-04-27 DIAGNOSIS — C711 Malignant neoplasm of frontal lobe: Secondary | ICD-10-CM

## 2012-04-27 DIAGNOSIS — Z5112 Encounter for antineoplastic immunotherapy: Secondary | ICD-10-CM

## 2012-04-27 DIAGNOSIS — I82409 Acute embolism and thrombosis of unspecified deep veins of unspecified lower extremity: Secondary | ICD-10-CM

## 2012-04-27 LAB — CBC WITH DIFFERENTIAL/PLATELET
BASO%: 0.3 % (ref 0.0–2.0)
EOS%: 3.3 % (ref 0.0–7.0)
MCH: 34.1 pg — ABNORMAL HIGH (ref 27.2–33.4)
MCHC: 34.2 g/dL (ref 32.0–36.0)
MONO#: 0.2 10*3/uL (ref 0.1–0.9)
NEUT%: 63.9 % (ref 39.0–75.0)
RBC: 4.05 10*6/uL — ABNORMAL LOW (ref 4.20–5.82)
RDW: 13.2 % (ref 11.0–14.6)
WBC: 3 10*3/uL — ABNORMAL LOW (ref 4.0–10.3)
lymph#: 0.8 10*3/uL — ABNORMAL LOW (ref 0.9–3.3)

## 2012-04-27 LAB — COMPREHENSIVE METABOLIC PANEL (CC13)
ALT: 55 U/L (ref 0–55)
AST: 43 U/L — ABNORMAL HIGH (ref 5–34)
BUN: 18 mg/dL (ref 7.0–26.0)
Calcium: 9.2 mg/dL (ref 8.4–10.4)
Chloride: 105 mEq/L (ref 98–107)
Creatinine: 0.9 mg/dL (ref 0.7–1.3)
Total Bilirubin: 0.8 mg/dL (ref 0.20–1.20)

## 2012-04-27 LAB — PROTIME-INR
INR: 2.8 (ref 2.00–3.50)
Protime: 33.6 Seconds — ABNORMAL HIGH (ref 10.6–13.4)

## 2012-04-27 LAB — UA PROTEIN, DIPSTICK - CHCC: Protein, ur: NEGATIVE mg/dL

## 2012-04-27 MED ORDER — SODIUM CHLORIDE 0.9 % IV SOLN
Freq: Once | INTRAVENOUS | Status: AC
  Administered 2012-04-27: 13:00:00 via INTRAVENOUS

## 2012-04-27 MED ORDER — ONDANSETRON HCL 8 MG PO TABS: 8.0000 mg | ORAL_TABLET | Freq: Three times a day (TID) | ORAL | Status: DC | PRN

## 2012-04-27 MED ORDER — SODIUM CHLORIDE 0.9 % IJ SOLN
10.0000 mL | INTRAMUSCULAR | Status: DC | PRN
  Administered 2012-04-27: 10 mL
  Filled 2012-04-27: qty 10

## 2012-04-27 MED ORDER — SODIUM CHLORIDE 0.9 % IV SOLN
10.0000 mg/kg | Freq: Once | INTRAVENOUS | Status: AC
  Administered 2012-04-27: 725 mg via INTRAVENOUS
  Filled 2012-04-27: qty 29

## 2012-04-27 MED ORDER — HEPARIN SOD (PORK) LOCK FLUSH 100 UNIT/ML IV SOLN
500.0000 [IU] | Freq: Once | INTRAVENOUS | Status: AC | PRN
  Administered 2012-04-27: 500 [IU]
  Filled 2012-04-27: qty 5

## 2012-04-27 NOTE — Patient Instructions (Addendum)
Proceed with avastin today  I will see you back in 2 weeks

## 2012-04-27 NOTE — Patient Instructions (Signed)
[facility] Discharge Instructions for Patients Receiving Chemotherapy  Today you received the following chemotherapy agents Avastin  To help prevent nausea and vomiting after your treatment, we encourage you to take your nausea medication as directed   If you develop nausea and vomiting that is not controlled by your nausea medication, call the clinic. If it is after clinic hours your family physician or the after hours number for the clinic or go to the Emergency Department.   BELOW ARE SYMPTOMS THAT SHOULD BE REPORTED IMMEDIATELY:  *FEVER GREATER THAN 100.5 F  *CHILLS WITH OR WITHOUT FEVER  NAUSEA AND VOMITING THAT IS NOT CONTROLLED WITH YOUR NAUSEA MEDICATION  *UNUSUAL SHORTNESS OF BREATH  *UNUSUAL BRUISING OR BLEEDING  TENDERNESS IN MOUTH AND THROAT WITH OR WITHOUT PRESENCE OF ULCERS  *URINARY PROBLEMS  *BOWEL PROBLEMS  UNUSUAL RASH Items with * indicate a potential emergency and should be followed up as soon as possible.  One of the nurses will contact you 24 hours after your treatment. Please let the nurse know about any problems that you may have experienced. Feel free to call the clinic you have any questions or concerns. The clinic phone number is [phone].   I have been informed and understand all the instructions given to me. I know to contact the clinic, my physician, or go to the Emergency Department if any problems should occur. I do not have any questions at this time, but understand that I may call the clinic during office hours   should I have any questions or need assistance in obtaining follow up care.    __________________________________________  _____________  __________ Signature of Patient or Authorized Representative            Date                   Time    __________________________________________ Nurse's Signature    

## 2012-04-27 NOTE — Progress Notes (Signed)
OFFICE PROGRESS NOTE  Dr. 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 The tumor chemotherapy consisted of Temodar 75 mg per meter squared.  #2 patient was then started on Temodar given days 1 through 5 every 28 days with concomitant Avastin given every 2 weeks.  #3 patient also developed lower extremity DVT as well as as pulmonary embolism he was started on Coumadin to try to maintain an INR between 2 and 2.5.  CURRENT THERAPY: Patient is here for his scheduled Avastin  INTERVAL HISTORY: Roger Raymond 61 y.o. male returns for followup visit today for his treatment.Overall he had a great trip to [place]. He did quite well. His INR is therapeutic at 2.8 he will continue the same dose of Coumadin. He otherwise denies any fevers chills night sweats shortness of breath chest pains palpitations no myalgias or arthralgias no swelling in his lower extremities. He is planning on going to [place] this weekend. Patient tells me that he does need a prescription for ondansetron which I will send to his pharmacy in [facility] of the 10 point review of systems is negative.   MEDICAL HISTORY: Past Medical History  Diagnosis Date  . 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 DVT (deep venous thrombosis) 07/08/2011    ALLERGIES:   has no known allergies.  MEDICATIONS:  Current Outpatient Prescriptions  Medication Sig Dispense Refill  . acetaminophen (TYLENOL) 325 MG tablet Take 650 mg by mouth every 6 (six) hours as needed.        . chlorproMAZINE (THORAZINE) 25 MG tablet Take 25 mg by mouth every 6 (six) hours as needed.        .  diphenoxylate-atropine (LOMOTIL) 2.5-0.025 MG per tablet Take 1 tablet by mouth 4 (four) times daily as needed. 1-2 tabs prn       . levETIRAcetam (KEPPRA) 500 MG tablet Take 500 mg by mouth 2 (two) times daily.        [name] lidocaine-prilocaine (EMLA) cream Apply topically as needed. Apply to port 1-2 hours before procedure       . ondansetron (ZOFRAN) 8 MG tablet Take 1 tablet (8 mg total) by mouth every 12 (twelve) hours as needed.  234 tablet  1  . sodium chloride (OCEAN) 0.65 % nasal spray Place 1 spray into the nose as needed.      . temozolomide (TEMODAR) 250 MG capsule Take 280 mg by mouth daily. May take on an empty stomach or at bedtime to decrease nausea & vomiting.  Take x5 days then off  x3 wks      . warfarin (COUMADIN) 10 MG tablet 10mg  MWF, 15 all others   03/02/2012  30 tablet  2  . warfarin (COUMADIN) 5 MG tablet Take 1 tablet (5 mg total) by mouth daily.  64 tablet  1    SURGICAL HISTORY:  Past Surgical History  Procedure Date  . Vasectomy     REVIEW OF SYSTEMS:  Pertinent items are noted in HPI.   PHYSICAL EXAMINATION: General appearance: alert, cooperative and appears stated age Head: Normocephalic, without obvious abnormality, atraumatic Neck: no adenopathy, no carotid bruit, no JVD,  supple, symmetrical, trachea midline and thyroid not enlarged, symmetric, no tenderness/mass/nodules Lymph nodes: Cervical, supraclavicular, and axillary nodes normal. Resp: clear to auscultation bilaterally and normal percussion bilaterally Back: symmetric, no curvature. ROM normal. No CVA tenderness. Cardio: regular rate and rhythm, S1, S2 normal, no murmur, click, rub or gallop and normal apical impulse GI: soft, non-tender; bowel sounds normal; no masses,  no organomegaly Extremities: extremities normal, atraumatic, no cyanosis or edema Neurologic: Alert and oriented X 3, normal strength and tone. Normal symmetric reflexes. Normal coordination and gait  ECOG PERFORMANCE STATUS: 0 -  Asymptomatic  Blood pressure 133/88, pulse 58, temperature 97.7 F (36.5 C), temperature source Oral, resp. rate 20, height 5\' 11"  (1.803 m), weight 166 lb 12.8 oz (75.66 kg).  LABORATORY DATA: Lab Results  Component Value Date   WBC 3.0* 04/27/2012   HGB 13.8 04/27/2012   HCT 40.4 04/27/2012   MCV 99.8* 04/27/2012   PLT 165 04/27/2012      Chemistry      Component Value Date/Time   NA 137 04/13/2012 1039   NA 138 03/02/2012 1100   K 4.3 04/13/2012 1039   K 4.5 03/02/2012 1100   CL 103 04/13/2012 1039   CL 102 03/02/2012 1100   CO2 25 04/13/2012 1039   CO2 23 03/02/2012 1100   BUN 16.0 04/13/2012 1039   BUN 25* 03/02/2012 1100   CREATININE 1.0 04/13/2012 1039   CREATININE 1.05 03/02/2012 1100      Component Value Date/Time   CALCIUM 8.7 04/13/2012 1039   CALCIUM 9.5 03/02/2012 1100   ALKPHOS 83 04/13/2012 1039   ALKPHOS 130* 03/02/2012 1100   AST 28 04/13/2012 1039   AST 39* 03/02/2012 1100   ALT 46 04/13/2012 1039   ALT 65* 03/02/2012 1100   BILITOT 0.60 04/13/2012 1039   BILITOT 0.4 03/02/2012 1100       RADIOGRAPHIC STUDIES:  No results found.  ASSESSMENT: 61 year old gentleman with:  1.  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 Overall he is doing well and tolerating his therapy quite well. He also is getting  Avastin and tolerating it very well.  2.  For his pulmonary embolus he is on Coumadin and he is very therapeutic with his INR. We are checking his INR is at [facility] with the results being faxed to [place] here at cancer center.  PLAN:   1. We will proceed with scheduled Avastin and he will return in 2 weeks' time in followup for his next dose of Avastin.  #3 left lower extremity edema secondary to history of DVT he is recommended to wear her Compression hose as well as keep his feet elevated. INR today is 2.8. I have recommended that he take Coumadin 10 mg 4 days a week with 12.5 mg the other days.[name] He will have his INR  checked at [facility] in [place] time.  #4 he will return in 2 weeks' time for his next dose of Avastin   All questions were answered. The patient knows to call the clinic with any problems, questions or concerns. We can certainly see the patient much sooner if necessary.  I spent >25 minutes counseling the patient face to face. The total time spent in the appointment was 30 minutes.    Marcy Panning, MD Medical/Oncology [facility] [phone] (beeper) [phone] (Office)  04/27/2012, 12:02 PM

## 2012-05-11 ENCOUNTER — Encounter: Payer: Self-pay | Admitting: Adult Health

## 2012-05-11 ENCOUNTER — Other Ambulatory Visit (HOSPITAL_BASED_OUTPATIENT_CLINIC_OR_DEPARTMENT_OTHER): Payer: 59

## 2012-05-11 ENCOUNTER — Ambulatory Visit (HOSPITAL_BASED_OUTPATIENT_CLINIC_OR_DEPARTMENT_OTHER): Payer: 59 | Admitting: Adult Health

## 2012-05-11 ENCOUNTER — Ambulatory Visit (HOSPITAL_BASED_OUTPATIENT_CLINIC_OR_DEPARTMENT_OTHER): Payer: 59

## 2012-05-11 VITALS — BP 136/86 | HR 60 | Temp 97.8°F | Resp 20 | Ht 71.0 in | Wt 165.1 lb

## 2012-05-11 DIAGNOSIS — I82409 Acute embolism and thrombosis of unspecified deep veins of unspecified lower extremity: Secondary | ICD-10-CM

## 2012-05-11 DIAGNOSIS — C711 Malignant neoplasm of frontal lobe: Secondary | ICD-10-CM

## 2012-05-11 DIAGNOSIS — I2699 Other pulmonary embolism without acute cor pulmonale: Secondary | ICD-10-CM

## 2012-05-11 DIAGNOSIS — Z5112 Encounter for antineoplastic immunotherapy: Secondary | ICD-10-CM

## 2012-05-11 LAB — COMPREHENSIVE METABOLIC PANEL (CC13)
ALT: 51 U/L (ref 0–55)
AST: 39 U/L — ABNORMAL HIGH (ref 5–34)
Alkaline Phosphatase: 97 U/L (ref 40–150)
BUN: 18 mg/dL (ref 7.0–26.0)
Calcium: 9.3 mg/dL (ref 8.4–10.4)
Creatinine: 1 mg/dL (ref 0.7–1.3)
Total Bilirubin: 0.6 mg/dL (ref 0.20–1.20)

## 2012-05-11 LAB — CBC WITH DIFFERENTIAL/PLATELET
BASO%: 0.5 % (ref 0.0–2.0)
EOS%: 2.1 % (ref 0.0–7.0)
HCT: 42.2 % (ref 38.4–49.9)
LYMPH%: 23 % (ref 14.0–49.0)
MCH: 34.5 pg — ABNORMAL HIGH (ref 27.2–33.4)
MCHC: 35.1 g/dL (ref 32.0–36.0)
MONO%: 9.2 % (ref 0.0–14.0)
NEUT%: 65.2 % (ref 39.0–75.0)
Platelets: 155 10*3/uL (ref 140–400)
RBC: 4.29 10*6/uL (ref 4.20–5.82)
WBC: 3.8 10*3/uL — ABNORMAL LOW (ref 4.0–10.3)
nRBC: 0 % (ref 0–0)

## 2012-05-11 LAB — PROTIME-INR: Protime: 33.6 Seconds — ABNORMAL HIGH (ref 10.6–13.4)

## 2012-05-11 MED ORDER — HEPARIN SOD (PORK) LOCK FLUSH 100 UNIT/ML IV SOLN
500.0000 [IU] | Freq: Once | INTRAVENOUS | Status: AC | PRN
  Administered 2012-05-11: 500 [IU]
  Filled 2012-05-11: qty 5

## 2012-05-11 MED ORDER — SODIUM CHLORIDE 0.9 % IV SOLN
Freq: Once | INTRAVENOUS | Status: AC
  Administered 2012-05-11: 12:00:00 via INTRAVENOUS

## 2012-05-11 MED ORDER — SODIUM CHLORIDE 0.9 % IJ SOLN
10.0000 mL | INTRAMUSCULAR | Status: DC | PRN
  Administered 2012-05-11: 10 mL
  Filled 2012-05-11: qty 10

## 2012-05-11 MED ORDER — SODIUM CHLORIDE 0.9 % IV SOLN
10.0000 mg/kg | Freq: Once | INTRAVENOUS | Status: AC
  Administered 2012-05-11: 725 mg via INTRAVENOUS
  Filled 2012-05-11: qty 29

## 2012-05-11 NOTE — Progress Notes (Signed)
Quick Note:  Please call patient: INR good keep same dose of coumadin ______

## 2012-05-11 NOTE — Patient Instructions (Signed)
[facility] Discharge Instructions for Patients Receiving Chemotherapy  Today you received the following chemotherapy agents Avastin To help prevent nausea and vomiting after your treatment, we encourage you to take your nausea medication as prescribed.  If you develop nausea and vomiting that is not controlled by your nausea medication, call the clinic. If it is after clinic hours your family physician or the after hours number for the clinic or go to the Emergency Department.   BELOW ARE SYMPTOMS THAT SHOULD BE REPORTED IMMEDIATELY:  *FEVER GREATER THAN 100.5 F  *CHILLS WITH OR WITHOUT FEVER  NAUSEA AND VOMITING THAT IS NOT CONTROLLED WITH YOUR NAUSEA MEDICATION  *UNUSUAL SHORTNESS OF BREATH  *UNUSUAL BRUISING OR BLEEDING  TENDERNESS IN MOUTH AND THROAT WITH OR WITHOUT PRESENCE OF ULCERS  *URINARY PROBLEMS  *BOWEL PROBLEMS  UNUSUAL RASH Items with * indicate a potential emergency and should be followed up as soon as possible.  One of the nurses will contact you 24 hours after your treatment. Please let the nurse know about any problems that you may have experienced. Feel free to call the clinic you have any questions or concerns. The clinic phone number is [phone].   I have been informed and understand all the instructions given to me. I know to contact the clinic, my physician, or go to the Emergency Department if any problems should occur. I do not have any questions at this time, but understand that I may call the clinic during office hours   should I have any questions or need assistance in obtaining follow up care.    __________________________________________  _____________  __________ Signature of Patient or Authorized Representative            Date                   Time    __________________________________________ Nurse's Signature    

## 2012-05-11 NOTE — Patient Instructions (Addendum)
Doing well.  Okay to proceed with Avastin.  We will see you back on 10/21. Please call us if you have any questions or concerns.

## 2012-05-11 NOTE — Progress Notes (Signed)
OFFICE PROGRESS NOTE  Dr. 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 The tumor chemotherapy consisted of Temodar 75 mg per meter squared.  #2 patient was then started on Temodar given days 1 through 5 every 28 days with concomitant Avastin given every 2 weeks.  #3 patient also developed lower extremity DVT as well as as pulmonary embolism he was started on Coumadin to try to maintain an INR between 2 and 2.5.  CURRENT THERAPY: Patient is here for his scheduled Avastin  INTERVAL HISTORY: Roger Raymond 61 y.o. male returns for followup visit today for his treatment.  He is feeling well and essentially without complaints.  He will have an MRI scheduled this month that Dr. Barn[facility] Pall office is arranging.  He recently went to [facility]. So he skipped his INR check last week.  Otherwise he is feeling well and w/o complaints.    MEDICAL HISTORY: Past Medical History  Diagnosis Date  . 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 DVT (deep venous thrombosis) 07/08/2011    ALLERGIES:   has no known allergies.  MEDICATIONS:  Current Outpatient Prescriptions  Medication Sig Dispense Refill  . acetaminophen (TYLENOL) 325 MG tablet Take 650 mg by mouth every 6 (six) hours as needed.        . levETIRAcetam (KEPPRA) 500 MG tablet Take 500 mg by mouth 2 (two) times daily.        [name] lidocaine-prilocaine (EMLA) cream Apply topically as needed. Apply to port 1-2 hours before procedure       . ondansetron (ZOFRAN) 8 MG tablet Take 1 tablet (8 mg total) by mouth every 12 (twelve) hours as needed.  234 tablet  1  . ondansetron (ZOFRAN) 8  MG tablet Take 1 tablet (8 mg total) by mouth every 8 (eight) hours as needed for nausea.  20 tablet  12  . sodium chloride (OCEAN) 0.65 % nasal spray Place 1 spray into the nose as needed.      . temozolomide (TEMODAR) 250 MG capsule Take 280 mg by mouth daily. May take on an empty stomach or at bedtime to decrease nausea & vomiting.  Take x5 days then off  x3 wks      . warfarin (COUMADIN) 10 MG tablet 10mg  MWF, 15 all others   03/02/2012  30 tablet  2  . warfarin (COUMADIN) 5 MG tablet Take 1 tablet (5 mg total) by mouth daily.  64 tablet  1  . chlorproMAZINE (THORAZINE) 25 MG tablet Take 25 mg by mouth every 6 (six) hours as needed.        . diphenoxylate-atropine (LOMOTIL) 2.5-0.025 MG per tablet Take 1 tablet by mouth 4 (four) times daily as needed. 1-2 tabs prn         SURGICAL HISTORY:  Past Surgical History  Procedure Date  . Vasectomy     REVIEW OF SYSTEMS:  General: fatigue (-), night sweats (-), fever (-), pain (-) Lymph: palpable nodes (-) HEENT: vision changes (-), mucositis (-), gum bleeding (-), epistaxis (-) Cardiovascular: chest pain (-), palpitations (-) Pulmonary: shortness of breath (-), dyspnea (-), cough (-), hemoptysis (-) GI:  Early satiety (-), melena (-), dysphagia (-), nausea/vomiting (-), diarrhea (-) GU: dysuria (-), hematuria (-), incontinence (-) Musculoskeletal: joint swelling (-), joint pain (-), back pain (-) Neuro: weakness (-), numbness (-), headache (-), confusion (-) Skin: Rash (-), lesions (-), dryness (-) Psych: depression (-), suicidal/homicidal ideation (-), feeling of hopelessness (-)  PHYSICAL EXAMINATION:  BP 136/86  Pulse 60  Temp 97.8 F (36.6 C) (Oral)  Resp 20  Ht 5\' 11"  (1.803 m)  Wt 165 lb 1.6 oz (74.889 kg)  BMI 23.03 kg/m2 General: Patient is a well appearing male in no acute distress HEENT: PERRLA, sclerae anicteric no conjunctival pallor, MMM Neck: supple, no palpable adenopathy Lungs: clear to auscultation  bilaterally, no wheezes, rhonchi, or rales Cardiovascular: regular rate rhythm, S1, S2, no murmurs, rubs or gallops Abdomen: Soft, non-tender, non-distended, normoactive bowel sounds, no HSM Extremities: warm and well perfused, no clubbing, cyanosis, or edema Skin: No rashes or lesions Neuro: CN II-XII intact.  Normal gait, 5/5 strength in all extremities.   ECOG PERFORMANCE STATUS: 0 - Asymptomatic    LABORATORY DATA: Lab Results  Component Value Date   WBC 3.8* 05/11/2012   HGB 14.8 05/11/2012   HCT 42.2 05/11/2012   MCV 98.4* 05/11/2012   PLT 155 05/11/2012      Chemistry      Component Value Date/Time   NA 140 04/27/2012 1124   NA 138 03/02/2012 1100   K 4.2 04/27/2012 1124   K 4.5 03/02/2012 1100   CL 105 04/27/2012 1124   CL 102 03/02/2012 1100   CO2 25 04/27/2012 1124   CO2 23 03/02/2012 1100   BUN 18.0 04/27/2012 1124   BUN 25* 03/02/2012 1100   CREATININE 0.9 04/27/2012 1124   CREATININE 1.05 03/02/2012 1100      Component Value Date/Time   CALCIUM 9.2 04/27/2012 1124   CALCIUM 9.5 03/02/2012 1100   ALKPHOS 92 04/27/2012 1124   ALKPHOS 130* 03/02/2012 1100   AST 43* 04/27/2012 1124   AST 39* 03/02/2012 1100   ALT 55 04/27/2012 1124   ALT 65* 03/02/2012 1100   BILITOT 0.80 04/27/2012 1124   BILITOT 0.4 03/02/2012 1100       RADIOGRAPHIC STUDIES:  No results found.  ASSESSMENT: 61 year old gentleman with:  1.  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 Overall he is doing well and tolerating his therapy quite well. He also is getting  Avastin and tolerating it very well.  2.  For his pulmonary embolus he is on Coumadin and he is very therapeutic with his INR. We are checking his INR is at [facility] with the results being faxed to [place] here at cancer center.  PLAN:   1.  Proceed with treatment today.  He will take his Temodar tonight with Ondansetron as a premed thirty minutes prior.  He will have his MRI  scheduled per Dr. [facility] Pall office this month.    2.  left lower extremity edema secondary to history of DVT he is recommended to wear her Compression hose as well as keep his feet elevated. INR last check here was 2.8. I have recommended that he take Coumadin 10 mg 5 days a week with 12.5 mg the other days. He will have his INR checked at [facility] in [place] time.  3. He will return in 2 weeks' time for his next dose of Avastin and appointment.    All questions were answered. The  patient knows to call the clinic with any problems, questions or concerns. We can certainly see the patient much sooner if necessary.  I spent >25 minutes counseling the patient face to face. The total time spent in the appointment was 30 minutes.    Suzan Garibaldi Sheppard Coil, [place] Phone: [phone]  05/11/2012, 12:07 PM

## 2012-05-15 ENCOUNTER — Other Ambulatory Visit: Payer: Self-pay | Admitting: Oncology

## 2012-05-20 ENCOUNTER — Telehealth: Payer: Self-pay | Admitting: *Deleted

## 2012-05-20 NOTE — Telephone Encounter (Signed)
Pt called with PT/INR results PT 23.0   INR 1.9. Per MD pt to take 12.5mg  coumadin daily. Check labs as scheduled. Pt verbalized understanding.

## 2012-05-22 ENCOUNTER — Ambulatory Visit: Payer: 59 | Admitting: Oncology

## 2012-05-25 ENCOUNTER — Telehealth: Payer: Self-pay | Admitting: *Deleted

## 2012-05-25 ENCOUNTER — Ambulatory Visit (HOSPITAL_BASED_OUTPATIENT_CLINIC_OR_DEPARTMENT_OTHER): Payer: 59

## 2012-05-25 ENCOUNTER — Ambulatory Visit (HOSPITAL_BASED_OUTPATIENT_CLINIC_OR_DEPARTMENT_OTHER): Payer: 59 | Admitting: Oncology

## 2012-05-25 ENCOUNTER — Encounter: Payer: Self-pay | Admitting: Oncology

## 2012-05-25 ENCOUNTER — Other Ambulatory Visit (HOSPITAL_BASED_OUTPATIENT_CLINIC_OR_DEPARTMENT_OTHER): Payer: 59

## 2012-05-25 VITALS — BP 130/86 | HR 67 | Temp 97.4°F | Resp 20 | Ht 71.0 in | Wt 165.6 lb

## 2012-05-25 DIAGNOSIS — Z5112 Encounter for antineoplastic immunotherapy: Secondary | ICD-10-CM

## 2012-05-25 DIAGNOSIS — C711 Malignant neoplasm of frontal lobe: Secondary | ICD-10-CM

## 2012-05-25 DIAGNOSIS — I82409 Acute embolism and thrombosis of unspecified deep veins of unspecified lower extremity: Secondary | ICD-10-CM

## 2012-05-25 DIAGNOSIS — Z86718 Personal history of other venous thrombosis and embolism: Secondary | ICD-10-CM

## 2012-05-25 DIAGNOSIS — M25519 Pain in unspecified shoulder: Secondary | ICD-10-CM

## 2012-05-25 DIAGNOSIS — C719 Malignant neoplasm of brain, unspecified: Secondary | ICD-10-CM

## 2012-05-25 DIAGNOSIS — R609 Edema, unspecified: Secondary | ICD-10-CM

## 2012-05-25 LAB — COMPREHENSIVE METABOLIC PANEL (CC13)
ALT: 86 U/L — ABNORMAL HIGH (ref 0–55)
Albumin: 4 g/dL (ref 3.5–5.0)
CO2: 22 mEq/L (ref 22–29)
Calcium: 9.2 mg/dL (ref 8.4–10.4)
Chloride: 106 mEq/L (ref 98–107)
Glucose: 79 mg/dl (ref 70–99)
Sodium: 139 mEq/L (ref 136–145)
Total Protein: 6.6 g/dL (ref 6.4–8.3)

## 2012-05-25 LAB — CBC WITH DIFFERENTIAL/PLATELET
Basophils Absolute: 0 10*3/uL (ref 0.0–0.1)
Eosinophils Absolute: 0.1 10*3/uL (ref 0.0–0.5)
HCT: 44.2 % (ref 38.4–49.9)
HGB: 15.2 g/dL (ref 13.0–17.1)
MCH: 34.5 pg — ABNORMAL HIGH (ref 27.2–33.4)
MCV: 100.2 fL — ABNORMAL HIGH (ref 79.3–98.0)
NEUT#: 2.9 10*3/uL (ref 1.5–6.5)
NEUT%: 70.6 % (ref 39.0–75.0)
RDW: 13.2 % (ref 11.0–14.6)
lymph#: 0.8 10*3/uL — ABNORMAL LOW (ref 0.9–3.3)

## 2012-05-25 LAB — UA PROTEIN, DIPSTICK - CHCC: Protein, ur: NEGATIVE mg/dL

## 2012-05-25 LAB — PROTIME-INR
INR: 3.5 (ref 2.00–3.50)
Protime: 42 Seconds — ABNORMAL HIGH (ref 10.6–13.4)

## 2012-05-25 MED ORDER — SODIUM CHLORIDE 0.9 % IV SOLN
Freq: Once | INTRAVENOUS | Status: AC
  Administered 2012-05-25: 13:00:00 via INTRAVENOUS

## 2012-05-25 MED ORDER — HEPARIN SOD (PORK) LOCK FLUSH 100 UNIT/ML IV SOLN
500.0000 [IU] | Freq: Once | INTRAVENOUS | Status: DC | PRN
  Filled 2012-05-25: qty 5

## 2012-05-25 MED ORDER — SODIUM CHLORIDE 0.9 % IJ SOLN
10.0000 mL | INTRAMUSCULAR | Status: DC | PRN
  Filled 2012-05-25: qty 10

## 2012-05-25 MED ORDER — SODIUM CHLORIDE 0.9 % IV SOLN
10.0000 mg/kg | Freq: Once | INTRAVENOUS | Status: AC
  Administered 2012-05-25: 725 mg via INTRAVENOUS
  Filled 2012-05-25: qty 29

## 2012-05-25 NOTE — Telephone Encounter (Signed)
Per staff message and POF I have scheduled appts.  JMW  

## 2012-05-25 NOTE — Patient Instructions (Addendum)
Proceed with avastin today  Change coumadin as discussed  I will see you back in 2 weeks at which we start temodar

## 2012-05-25 NOTE — Progress Notes (Signed)
OFFICE PROGRESS NOTE  Dr. 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 The tumor chemotherapy consisted of Temodar 75 mg per meter squared.  #2 patient was then started on Temodar given days 1 through 5 every 28 days with concomitant Avastin given every 2 weeks.  #3 patient also developed lower extremity DVT as well as as pulmonary embolism he was started on Coumadin to try to maintain an INR between 2 and 2.5.  CURRENT THERAPY: Patient is here for his scheduled Avastin  INTERVAL HISTORY: Roger Raymond 61 y.o. male returns for followup visit today for his treatment.  Overall he is feeling well he did play golf over the weekend and he is complaining about soreness in his shoulders. On examination he does not have any muscle tenderness I do think that he may have a libido bursitis or arthritic type of pain. He otherwise denies any bleeding problems no fevers chills night sweats headaches no shortness of breath no chest pains no easy bruising or bleeding. Remainder of the 10 point review of systems is negative.[name]    MEDICAL HISTORY: Past Medical History  Diagnosis Date  . 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 DVT (deep venous thrombosis) 07/08/2011    ALLERGIES:   has no known allergies.  MEDICATIONS:  Current Outpatient Prescriptions  Medication Sig Dispense Refill  . acetaminophen (TYLENOL) 325 MG tablet Take 650 mg by mouth every 6 (six) hours as needed.        . chlorproMAZINE (THORAZINE) 25 MG tablet Take 25 mg by mouth every 6 (six) hours as needed.        . diphenoxylate-atropine (LOMOTIL) 2.5-0.025 MG per tablet  Take 1 tablet by mouth 4 (four) times daily as needed. 1-2 tabs prn       . levETIRAcetam (KEPPRA) 500 MG tablet Take 500 mg by mouth 2 (two) times daily.        [name] lidocaine-prilocaine (EMLA) cream Apply topically as needed. Apply to port 1-2 hours before procedure       . ondansetron (ZOFRAN) 8 MG tablet Take 1 tablet (8 mg total) by mouth every 12 (twelve) hours as needed.  234 tablet  1  . ondansetron (ZOFRAN) 8 MG tablet Take 1 tablet (8 mg total) by mouth every 8 (eight) hours as needed for nausea.  20 tablet  12  . sodium chloride (OCEAN) 0.65 % nasal spray Place 1 spray into the nose as needed.      . temozolomide (TEMODAR) 250 MG capsule Take 280 mg by mouth daily. May take on an empty stomach or at bedtime to decrease nausea & vomiting.  Take x5 days then off  x3 wks      . warfarin (COUMADIN) 10 MG tablet TAKE 1 TAB ON MON,WED, FRI,--TAKE 1 & 1/2 TABS ALL OTHER DAYS  30 tablet  2  . warfarin (COUMADIN) 5 MG tablet Take 1 tablet (5 mg total) by mouth daily.  64 tablet  1    SURGICAL HISTORY:  Past Surgical History  Procedure Date  . Vasectomy     REVIEW OF SYSTEMS:  General: fatigue (-), night sweats (-), fever (-),  pain (-) Lymph: palpable nodes (-) HEENT: vision changes (-), mucositis (-), gum bleeding (-), epistaxis (-) Cardiovascular: chest pain (-), palpitations (-) Pulmonary: shortness of breath (-), dyspnea (-), cough (-), hemoptysis (-) GI:  Early satiety (-), melena (-), dysphagia (-), nausea/vomiting (-), diarrhea (-) GU: dysuria (-), hematuria (-), incontinence (-) Musculoskeletal: joint swelling (-), joint pain (+), back pain (-) Neuro: weakness (-), numbness (-), headache (-), confusion (-) Skin: Rash (-), lesions (-), dryness (-) Psych: depression (-), suicidal/homicidal ideation (-), feeling of hopelessness (-)  PHYSICAL EXAMINATION:  BP 130/86  Pulse 67  Temp 97.4 F (36.3 C) (Oral)  Resp 20  Ht 5\' 11"  (1.803 m)  Wt 165 lb 9.6 oz (75.116 kg)  BMI 23.10  kg/m2 General: Patient is a well appearing male in no acute distress HEENT: PERRLA, sclerae anicteric no conjunctival pallor, MMM Neck: supple, no palpable adenopathy Lungs: clear to auscultation bilaterally, no wheezes, rhonchi, or rales Cardiovascular: regular rate rhythm, S1, S2, no murmurs, rubs or gallops Abdomen: Soft, non-tender, non-distended, normoactive bowel sounds, no HSM Extremities: warm and well perfused, no clubbing, cyanosis, or edema Skin: No rashes or lesions Neuro: CN II-XII intact.  Normal gait, 5/5 strength in all extremities.   ECOG PERFORMANCE STATUS: 0 - Asymptomatic    LABORATORY DATA: Lab Results  Component Value Date   WBC 4.1 05/25/2012   HGB 15.2 05/25/2012   HCT 44.2 05/25/2012   MCV 100.2* 05/25/2012   PLT 162 05/25/2012      Chemistry      Component Value Date/Time   NA 137 05/11/2012 1130   NA 138 03/02/2012 1100   K 4.3 05/11/2012 1130   K 4.5 03/02/2012 1100   CL 104 05/11/2012 1130   CL 102 03/02/2012 1100   CO2 23 05/11/2012 1130   CO2 23 03/02/2012 1100   BUN 18.0 05/11/2012 1130   BUN 25* 03/02/2012 1100   CREATININE 1.0 05/11/2012 1130   CREATININE 1.05 03/02/2012 1100      Component Value Date/Time   CALCIUM 9.3 05/11/2012 1130   CALCIUM 9.5 03/02/2012 1100   ALKPHOS 97 05/11/2012 1130   ALKPHOS 130* 03/02/2012 1100   AST 39* 05/11/2012 1130   AST 39* 03/02/2012 1100   ALT 51 05/11/2012 1130   ALT 65* 03/02/2012 1100   BILITOT 0.60 05/11/2012 1130   BILITOT 0.4 03/02/2012 1100       RADIOGRAPHIC STUDIES:  No results found.  ASSESSMENT: 61 year old gentleman with:  1.  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 Overall he is doing well and tolerating his therapy quite well. He also is getting  Avastin and tolerating it very well.  2.  For his pulmonary embolus he is on Coumadin and he is very therapeutic with his INR. We are checking his INR is at [facility] with the  results being faxed to [place] here at cancer center.  #3 shoulder pain I have recommended that he try some Ibuprofen 200 mg twice a day for the next 4-5 days to see if he gets any relief if not then he certainly can go see his orthopedic surgeon.  #4 patient also tells me that in January they will be going to [place] for 14 weeks therefore we will need to make arrangements for him to receive his Avastin by an oncologist there. ER and he has contact as he has done this before.  PLAN:   1.  Proceed with treatment today.  He will take his Temodar tonight with Ondansetron as a premed thirty minutes prior.  He will have his MRI scheduled per Dr. Barnet Pall office this month.    2.  left lower extremity edema secondary to history of DVT he is recommended to wear her Compression hose as well as keep his feet elevated.I and our today is 3.5 he will begin taking Coumadin 12.5 mg Monday Wednesday Friday and 10 mg other days and we will check INR in 1 week's time in [facility].  3. He will return in 2 weeks' time for his next dose of Avastin and appointment.  He will also begin Temodar adjuvantly days 1 through 5 with his next dose of Avastin.  All questions were answered. The patient knows to call the clinic with any problems, questions or concerns. We can certainly see the patient much sooner if necessary.  I spent >25 minutes counseling the patient face to face. The total time spent in the appointment was 30 minutes.  Marcy Panning, MD Medical/Oncology [facility] [phone] (beeper) [phone] (Office)  05/25/2012, 9:43 PM

## 2012-05-25 NOTE — Telephone Encounter (Signed)
Made patient appointments for 07-06-2012 08-03-2012 08-17-2012  Sent michelle email to set up patient treatments

## 2012-05-25 NOTE — Progress Notes (Signed)
Urine protein negative 

## 2012-05-25 NOTE — Patient Instructions (Signed)
[facility] Discharge Instructions for Patients Receiving Chemotherapy  Today you received the following chemotherapy agents: Avastin  To help prevent nausea and vomiting after your treatment, we encourage you to take your nausea medication.  Take it as often as prescribed.     If you develop nausea and vomiting that is not controlled by your nausea medication, call the clinic. If it is after clinic hours your family physician or the after hours number for the clinic or go to the Emergency Department.   BELOW ARE SYMPTOMS THAT SHOULD BE REPORTED IMMEDIATELY:  *FEVER GREATER THAN 100.5 F  *CHILLS WITH OR WITHOUT FEVER  NAUSEA AND VOMITING THAT IS NOT CONTROLLED WITH YOUR NAUSEA MEDICATION  *UNUSUAL SHORTNESS OF BREATH  *UNUSUAL BRUISING OR BLEEDING  TENDERNESS IN MOUTH AND THROAT WITH OR WITHOUT PRESENCE OF ULCERS  *URINARY PROBLEMS  *BOWEL PROBLEMS  UNUSUAL RASH Items with * indicate a potential emergency and should be followed up as soon as possible.  Feel free to call the clinic you have any questions or concerns. The clinic phone number is [phone].   I have been informed and understand all the instructions given to me. I know to contact the clinic, my physician, or go to the Emergency Department if any problems should occur. I do not have any questions at this time, but understand that I may call the clinic during office hours   should I have any questions or need assistance in obtaining follow up care.    __________________________________________  _____________  __________ Signature of Patient or Authorized Representative            Date                   Time    __________________________________________ Nurse's Signature    

## 2012-05-29 ENCOUNTER — Encounter: Payer: Self-pay | Admitting: Oncology

## 2012-05-29 NOTE — Progress Notes (Signed)
[facility] approved ondansetron 8mg  from 05/28/12-11/26/12.

## 2012-06-04 ENCOUNTER — Telehealth: Payer: Self-pay | Admitting: *Deleted

## 2012-06-04 NOTE — Telephone Encounter (Signed)
Pt called with Lab results:  PT- 27.9     INR 2.4   WBC 3.8   PLT 154  Coumadin 12.5mg  M/W/F/ 10mg  other days F/U 11/4 Will review with Provider

## 2012-06-04 NOTE — Telephone Encounter (Signed)
Keep same dose, recheck in 1 week

## 2012-06-04 NOTE — Telephone Encounter (Signed)
Per MD, notified pt LMOVM-to continue Coumadin 12.5 M/W/F 10mg  other days. Recheck labs as scheduled. Request pt call back to confirm message received.

## 2012-06-08 ENCOUNTER — Ambulatory Visit (HOSPITAL_BASED_OUTPATIENT_CLINIC_OR_DEPARTMENT_OTHER): Payer: 59

## 2012-06-08 ENCOUNTER — Encounter: Payer: Self-pay | Admitting: Oncology

## 2012-06-08 ENCOUNTER — Ambulatory Visit (HOSPITAL_BASED_OUTPATIENT_CLINIC_OR_DEPARTMENT_OTHER): Payer: 59 | Admitting: Oncology

## 2012-06-08 ENCOUNTER — Other Ambulatory Visit (HOSPITAL_BASED_OUTPATIENT_CLINIC_OR_DEPARTMENT_OTHER): Payer: 59

## 2012-06-08 VITALS — BP 144/76 | HR 62 | Temp 97.6°F | Resp 20 | Ht 71.0 in | Wt 167.3 lb

## 2012-06-08 DIAGNOSIS — C711 Malignant neoplasm of frontal lobe: Secondary | ICD-10-CM

## 2012-06-08 DIAGNOSIS — I2699 Other pulmonary embolism without acute cor pulmonale: Secondary | ICD-10-CM

## 2012-06-08 DIAGNOSIS — I82409 Acute embolism and thrombosis of unspecified deep veins of unspecified lower extremity: Secondary | ICD-10-CM

## 2012-06-08 DIAGNOSIS — Z5112 Encounter for antineoplastic immunotherapy: Secondary | ICD-10-CM

## 2012-06-08 LAB — CBC WITH DIFFERENTIAL/PLATELET
BASO%: 0.5 % (ref 0.0–2.0)
EOS%: 3.9 % (ref 0.0–7.0)
LYMPH%: 19.4 % (ref 14.0–49.0)
MCH: 34 pg — ABNORMAL HIGH (ref 27.2–33.4)
MCHC: 34.4 g/dL (ref 32.0–36.0)
MONO#: 0.3 10*3/uL (ref 0.1–0.9)
NEUT%: 69.6 % (ref 39.0–75.0)
Platelets: 137 10*3/uL — ABNORMAL LOW (ref 140–400)
RBC: 4.24 10*6/uL (ref 4.20–5.82)
WBC: 4.4 10*3/uL (ref 4.0–10.3)
nRBC: 0 % (ref 0–0)

## 2012-06-08 LAB — COMPREHENSIVE METABOLIC PANEL (CC13)
AST: 31 U/L (ref 5–34)
Alkaline Phosphatase: 104 U/L (ref 40–150)
BUN: 20 mg/dL (ref 7.0–26.0)
Creatinine: 0.9 mg/dL (ref 0.7–1.3)
Glucose: 83 mg/dl (ref 70–99)
Potassium: 4.3 mEq/L (ref 3.5–5.1)
Total Bilirubin: 0.42 mg/dL (ref 0.20–1.20)

## 2012-06-08 MED ORDER — HEPARIN SOD (PORK) LOCK FLUSH 100 UNIT/ML IV SOLN
500.0000 [IU] | Freq: Once | INTRAVENOUS | Status: AC | PRN
  Administered 2012-06-08: 500 [IU]
  Filled 2012-06-08: qty 5

## 2012-06-08 MED ORDER — SODIUM CHLORIDE 0.9 % IV SOLN
Freq: Once | INTRAVENOUS | Status: AC
  Administered 2012-06-08: 13:00:00 via INTRAVENOUS

## 2012-06-08 MED ORDER — SODIUM CHLORIDE 0.9 % IV SOLN
10.0000 mg/kg | Freq: Once | INTRAVENOUS | Status: AC
  Administered 2012-06-08: 725 mg via INTRAVENOUS
  Filled 2012-06-08: qty 29

## 2012-06-08 MED ORDER — SODIUM CHLORIDE 0.9 % IJ SOLN
10.0000 mL | INTRAMUSCULAR | Status: DC | PRN
  Administered 2012-06-08: 10 mL
  Filled 2012-06-08: qty 10

## 2012-06-08 NOTE — Patient Instructions (Addendum)
[facility] Discharge Instructions for Patients Receiving Chemotherapy  Today you received the following chemotherapy agents Avastin To help prevent nausea and vomiting after your treatment, we encourage you to take your nausea medication as prescribed.  If you develop nausea and vomiting that is not controlled by your nausea medication, call the clinic. If it is after clinic hours your family physician or the after hours number for the clinic or go to the Emergency Department.   BELOW ARE SYMPTOMS THAT SHOULD BE REPORTED IMMEDIATELY:  *FEVER GREATER THAN 100.5 F  *CHILLS WITH OR WITHOUT FEVER  NAUSEA AND VOMITING THAT IS NOT CONTROLLED WITH YOUR NAUSEA MEDICATION  *UNUSUAL SHORTNESS OF BREATH  *UNUSUAL BRUISING OR BLEEDING  TENDERNESS IN MOUTH AND THROAT WITH OR WITHOUT PRESENCE OF ULCERS  *URINARY PROBLEMS  *BOWEL PROBLEMS  UNUSUAL RASH Items with * indicate a potential emergency and should be followed up as soon as possible.  One of the nurses will contact you 24 hours after your treatment. Please let the nurse know about any problems that you may have experienced. Feel free to call the clinic you have any questions or concerns. The clinic phone number is [phone].   I have been informed and understand all the instructions given to me. I know to contact the clinic, my physician, or go to the Emergency Department if any problems should occur. I do not have any questions at this time, but understand that I may call the clinic during office hours   should I have any questions or need assistance in obtaining follow up care.    __________________________________________  _____________  __________ Signature of Patient or Authorized Representative            Date                   Time    __________________________________________ Nurse's Signature    

## 2012-06-08 NOTE — Patient Instructions (Addendum)
Proceed with temodar today days 1 - 5 and avastin today  We will see you back in 2 weeks

## 2012-06-08 NOTE — Progress Notes (Signed)
OFFICE PROGRESS NOTE  Dr. 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 The tumor chemotherapy consisted of Temodar 75 mg per meter squared.  #2 patient was then started on Temodar given days 1 through 5 every 28 days with concomitant Avastin given every 2 weeks.  #3 patient also developed lower extremity DVT as well as as pulmonary embolism he was started on Coumadin to try to maintain an INR between 2 and 2.5.  CURRENT THERAPY: Patient is here for his scheduled Avastin  INTERVAL HISTORY: Roger Raymond 61 y.o. male returns for followup visit today for his treatment.  Overall he is feeling well he did play golf over the weekend and he is complaining about soreness in his shoulders. On examination he does not have any muscle tenderness I do think that he may have a libido bursitis or arthritic type of pain. He otherwise denies any bleeding problems no fevers chills night sweats headaches no shortness of breath no chest pains no easy bruising or bleeding. Remainder of the 10 point review of systems is negative.[name]    MEDICAL HISTORY: Past Medical History  Diagnosis Date  . 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 DVT (deep venous thrombosis) 07/08/2011    ALLERGIES:   has no known allergies.  MEDICATIONS:  Current Outpatient Prescriptions  Medication Sig Dispense Refill  . acetaminophen (TYLENOL) 325 MG tablet Take 650 mg by mouth every 6 (six) hours as needed.        . levETIRAcetam (KEPPRA) 500 MG tablet Take 500 mg by mouth 2 (two) times daily.        [name] lidocaine-prilocaine (EMLA) cream Apply topically as needed. Apply to  port 1-2 hours before procedure       . ondansetron (ZOFRAN) 8 MG tablet Take 1 tablet (8 mg total) by mouth every 12 (twelve) hours as needed.  234 tablet  1  . ondansetron (ZOFRAN) 8 MG tablet Take 1 tablet (8 mg total) by mouth every 8 (eight) hours as needed for nausea.  20 tablet  12  . sodium chloride (OCEAN) 0.65 % nasal spray Place 1 spray into the nose as needed.      . temozolomide (TEMODAR) 250 MG capsule Take 280 mg by mouth daily. May take on an empty stomach or at bedtime to decrease nausea & vomiting.  Take x5 days then off  x3 wks      . warfarin (COUMADIN) 10 MG tablet TAKE 1 TAB ON MON,WED, FRI,--TAKE 1 & 1/2 TABS ALL OTHER DAYS  30 tablet  2  . warfarin (COUMADIN) 5 MG tablet Take 1 tablet (5 mg total) by mouth daily.  64 tablet  1  . chlorproMAZINE (THORAZINE) 25 MG tablet Take 25 mg by mouth every 6 (six) hours as needed.        . diphenoxylate-atropine (LOMOTIL) 2.5-0.025 MG per tablet Take 1 tablet by mouth 4 (four) times daily as needed. 1-2 tabs prn         SURGICAL HISTORY:  Past Surgical History  Procedure Date  . Vasectomy     REVIEW OF SYSTEMS:  General: fatigue (-), night sweats (-), fever (-),  pain (-) Lymph: palpable nodes (-) HEENT: vision changes (-), mucositis (-), gum bleeding (-), epistaxis (-) Cardiovascular: chest pain (-), palpitations (-) Pulmonary: shortness of breath (-), dyspnea (-), cough (-), hemoptysis (-) GI:  Early satiety (-), melena (-), dysphagia (-), nausea/vomiting (-), diarrhea (-) GU: dysuria (-), hematuria (-), incontinence (-) Musculoskeletal: joint swelling (-), joint pain (+), back pain (-) Neuro: weakness (-), numbness (-), headache (-), confusion (-) Skin: Rash (-), lesions (-), dryness (-) Psych: depression (-), suicidal/homicidal ideation (-), feeling of hopelessness (-)  PHYSICAL EXAMINATION:  BP 144/76  Pulse 62  Temp 97.6 F (36.4 C) (Oral)  Resp 20  Ht 5\' 11"  (1.803 m)  Wt 167 lb 4.8 oz (75.887 kg)  BMI 23.33  kg/m2 General: Patient is a well appearing male in no acute distress HEENT: PERRLA, sclerae anicteric no conjunctival pallor, MMM Neck: supple, no palpable adenopathy Lungs: clear to auscultation bilaterally, no wheezes, rhonchi, or rales Cardiovascular: regular rate rhythm, S1, S2, no murmurs, rubs or gallops Abdomen: Soft, non-tender, non-distended, normoactive bowel sounds, no HSM Extremities: warm and well perfused, no clubbing, cyanosis, or edema Skin: No rashes or lesions Neuro: CN II-XII intact.  Normal gait, 5/5 strength in all extremities.   ECOG PERFORMANCE STATUS: 0 - Asymptomatic    LABORATORY DATA: Lab Results  Component Value Date   WBC 4.4 06/08/2012   HGB 14.4 06/08/2012   HCT 41.8 06/08/2012   MCV 98.6* 06/08/2012   PLT 137* 06/08/2012      Chemistry      Component Value Date/Time   NA 139 05/25/2012 1129   NA 138 03/02/2012 1100   K 4.5 05/25/2012 1129   K 4.5 03/02/2012 1100   CL 106 05/25/2012 1129   CL 102 03/02/2012 1100   CO2 22 05/25/2012 1129   CO2 23 03/02/2012 1100   BUN 14.0 05/25/2012 1129   BUN 25* 03/02/2012 1100   CREATININE 0.9 05/25/2012 1129   CREATININE 1.05 03/02/2012 1100      Component Value Date/Time   CALCIUM 9.2 05/25/2012 1129   CALCIUM 9.5 03/02/2012 1100   ALKPHOS 144 05/25/2012 1129   ALKPHOS 130* 03/02/2012 1100   AST 64* 05/25/2012 1129   AST 39* 03/02/2012 1100   ALT 86* 05/25/2012 1129   ALT 65* 03/02/2012 1100   BILITOT 0.40 05/25/2012 1129   BILITOT 0.4 03/02/2012 1100       RADIOGRAPHIC STUDIES:  No results found.  ASSESSMENT: 61 year old gentleman with:  1.  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 Overall he is doing well and tolerating his therapy quite well. He also is getting  Avastin and tolerating it very well.  2.  For his pulmonary embolus he is on Coumadin and he is very therapeutic with his INR. We are checking his INR is at [facility]  with the results being faxed to [place] here at cancer center.  #3 shoulder pain I have recommended that he try some Ibuprofen 200 mg twice a day for the next 4-5 days to see if he gets any relief if not then he certainly can go see his orthopedic surgeon.  #4 patient also tells me that in January they will be going to [place] for 14 weeks therefore we will need to make arrangements for him to receive his Avastin by an oncologist there. ER and he has contact as he has done this before.  PLAN:   1.  Proceed with treatment today.  He will take his Temodar tonight with Ondansetron as a premed thirty minutes prior.  He will have his MRI scheduled per Dr. Lollie Sails office this month.    2.  left lower extremity edema secondary to history of DVT he is recommended to wear her Compression hose as well as keep his feet elevated.I and our today is 3.5 he will begin taking Coumadin 12.5 mg Monday Wednesday Friday and 10 mg other days and we will check INR in 1 week's time in [facility].  3. He will return in 2 weeks' time for his next dose of Avastin and appointment.  He will also begin Temodar adjuvantly days 1 through 5 this week  All questions were answered. The patient knows to call the clinic with any problems, questions or concerns. We can certainly see the patient much sooner if necessary.  I spent >25 minutes counseling the patient face to face. The total time spent in the appointment was 30 minutes.  Marcy Panning, MD Medical/Oncology [facility] [phone] (beeper) [phone] (Office)  06/08/2012, 12:40 PM

## 2012-06-15 ENCOUNTER — Telehealth: Payer: Self-pay | Admitting: *Deleted

## 2012-06-15 LAB — PROTIME-INR: INR: 2.5 — AB (ref 0.9–1.1)

## 2012-06-15 NOTE — Telephone Encounter (Signed)
Labs received from [place], reviewed by MD. Instructed pt, per MD to continue Coumadin 12 mg M/W/F and  10 mg other days. Recheck labs as scheduled. Pt verbalized understanding

## 2012-06-17 ENCOUNTER — Other Ambulatory Visit: Payer: Self-pay | Admitting: Oncology

## 2012-06-22 ENCOUNTER — Ambulatory Visit (HOSPITAL_BASED_OUTPATIENT_CLINIC_OR_DEPARTMENT_OTHER): Payer: 59 | Admitting: Adult Health

## 2012-06-22 ENCOUNTER — Other Ambulatory Visit (HOSPITAL_BASED_OUTPATIENT_CLINIC_OR_DEPARTMENT_OTHER): Payer: 59

## 2012-06-22 ENCOUNTER — Encounter: Payer: Self-pay | Admitting: Adult Health

## 2012-06-22 ENCOUNTER — Ambulatory Visit (HOSPITAL_BASED_OUTPATIENT_CLINIC_OR_DEPARTMENT_OTHER): Payer: 59

## 2012-06-22 VITALS — BP 147/99 | HR 56 | Temp 98.4°F | Resp 20 | Ht 71.0 in | Wt 170.9 lb

## 2012-06-22 VITALS — BP 163/102 | HR 55

## 2012-06-22 DIAGNOSIS — C711 Malignant neoplasm of frontal lobe: Secondary | ICD-10-CM

## 2012-06-22 DIAGNOSIS — I82409 Acute embolism and thrombosis of unspecified deep veins of unspecified lower extremity: Secondary | ICD-10-CM

## 2012-06-22 DIAGNOSIS — Z5112 Encounter for antineoplastic immunotherapy: Secondary | ICD-10-CM

## 2012-06-22 DIAGNOSIS — I2699 Other pulmonary embolism without acute cor pulmonale: Secondary | ICD-10-CM

## 2012-06-22 DIAGNOSIS — I10 Essential (primary) hypertension: Secondary | ICD-10-CM

## 2012-06-22 DIAGNOSIS — C719 Malignant neoplasm of brain, unspecified: Secondary | ICD-10-CM

## 2012-06-22 LAB — COMPREHENSIVE METABOLIC PANEL (CC13)
ALT: 45 U/L (ref 0–55)
BUN: 13 mg/dL (ref 7.0–26.0)
CO2: 27 mEq/L (ref 22–29)
Creatinine: 0.8 mg/dL (ref 0.7–1.3)
Total Bilirubin: 0.49 mg/dL (ref 0.20–1.20)

## 2012-06-22 LAB — CBC WITH DIFFERENTIAL/PLATELET
BASO%: 0.2 % (ref 0.0–2.0)
EOS%: 5.4 % (ref 0.0–7.0)
HCT: 39.7 % (ref 38.4–49.9)
LYMPH%: 18.7 % (ref 14.0–49.0)
MCH: 33.8 pg — ABNORMAL HIGH (ref 27.2–33.4)
MCHC: 34 g/dL (ref 32.0–36.0)
MONO#: 0.3 10*3/uL (ref 0.1–0.9)
NEUT%: 68.3 % (ref 39.0–75.0)
Platelets: 160 10*3/uL (ref 140–400)

## 2012-06-22 LAB — UA PROTEIN, DIPSTICK - CHCC: Protein, ur: NEGATIVE mg/dL

## 2012-06-22 MED ORDER — CLONIDINE HCL 0.1 MG PO TABS
0.2000 mg | ORAL_TABLET | Freq: Once | ORAL | Status: AC
  Administered 2012-06-22: 0.2 mg via ORAL

## 2012-06-22 MED ORDER — SODIUM CHLORIDE 0.9 % IV SOLN
10.0000 mg/kg | Freq: Once | INTRAVENOUS | Status: AC
  Administered 2012-06-22: 725 mg via INTRAVENOUS
  Filled 2012-06-22: qty 29

## 2012-06-22 MED ORDER — SODIUM CHLORIDE 0.9 % IJ SOLN
10.0000 mL | INTRAMUSCULAR | Status: DC | PRN
  Administered 2012-06-22: 10 mL
  Filled 2012-06-22: qty 10

## 2012-06-22 MED ORDER — SODIUM CHLORIDE 0.9 % IV SOLN
Freq: Once | INTRAVENOUS | Status: AC
  Administered 2012-06-22: 20 mL via INTRAVENOUS

## 2012-06-22 MED ORDER — HEPARIN SOD (PORK) LOCK FLUSH 100 UNIT/ML IV SOLN
500.0000 [IU] | Freq: Once | INTRAVENOUS | Status: AC | PRN
  Administered 2012-06-22: 500 [IU]
  Filled 2012-06-22: qty 5

## 2012-06-22 NOTE — Progress Notes (Signed)
OFFICE PROGRESS NOTE  Dr. 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 The tumor chemotherapy consisted of Temodar 75 mg per meter squared.  #2 patient was then started on Temodar given days 1 through 5 every 28 days with concomitant Avastin given every 2 weeks.  #3 patient also developed lower extremity DVT as well as as pulmonary embolism he was started on Coumadin to try to maintain an INR between 2 and 2.5.  CURRENT THERAPY: Patient is here for his scheduled Avastin day 15 of his current cycle  INTERVAL HISTORY: Roger Raymond 61 y.o. male returns for followup visit today for his treatment.  He is doing well.  He was in [place] this weekend helping his son paint and stain his deck.  He's had some mild epistaxis that resolves quickly, but is otherwise feeling well.  He denies any weakness, numbness, easy bruising, blood in his urine, sputum, or stool.  He is w/o complaints.      MEDICAL HISTORY: Past Medical History  Diagnosis Date  . 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 DVT (deep venous thrombosis) 07/08/2011    ALLERGIES:   has no known allergies.  MEDICATIONS:  Current Outpatient Prescriptions  Medication Sig Dispense Refill  . acetaminophen (TYLENOL) 325 MG tablet Take 650 mg by mouth every 6 (six) hours as needed.        . chlorproMAZINE (THORAZINE) 25 MG tablet Take 25 mg by mouth every 6 (six) hours as needed.        . diphenoxylate-atropine (LOMOTIL) 2.5-0.025 MG per tablet Take 1 tablet by mouth 4 (four) times daily as needed. 1-2 tabs prn       . levETIRAcetam (KEPPRA) 500 MG tablet Take 500 mg by mouth  2 (two) times daily.        [name] lidocaine-prilocaine (EMLA) cream Apply topically as needed. Apply to port 1-2 hours before procedure       . ondansetron (ZOFRAN) 8 MG tablet Take 1 tablet (8 mg total) by mouth every 12 (twelve) hours as needed.  234 tablet  1  . ondansetron (ZOFRAN) 8 MG tablet Take 1 tablet (8 mg total) by mouth every 8 (eight) hours as needed for nausea.  20 tablet  12  . sodium chloride (OCEAN) 0.65 % nasal spray Place 1 spray into the nose as needed.      . temozolomide (TEMODAR) 250 MG capsule Take 280 mg by mouth daily. May take on an empty stomach or at bedtime to decrease nausea & vomiting.  Take x5 days then off  x3 wks      . warfarin (COUMADIN) 10 MG tablet TAKE 1 TAB ON MON,WED, FRI,--TAKE 1 & 1/2 TABS ALL OTHER DAYS  30 tablet  2  . warfarin (COUMADIN) 5 MG tablet Take 1 tablet (5 mg total) by mouth daily. Or as directed by physician  64 tablet  0    SURGICAL HISTORY:  Past Surgical History  Procedure Date  . Vasectomy     REVIEW OF SYSTEMS:  General: fatigue (-), night sweats (-), fever (-), pain (-) Lymph: palpable nodes (-) HEENT: vision changes (-), mucositis (-),  gum bleeding (-), epistaxis (-) Cardiovascular: chest pain (-), palpitations (-) Pulmonary: shortness of breath (-), dyspnea on exertion (-), cough (-), hemoptysis (-) GI:  Early satiety (-), melena (-), dysphagia (-), nausea/vomiting (-), diarrhea (-) GU: dysuria (-), hematuria (-), incontinence (-) Musculoskeletal: joint swelling (-), joint pain (+), back pain (-) Neuro: weakness (-), numbness (-), headache (-), confusion (-) Skin: Rash (-), lesions (-), dryness (-) Psych: depression (-), suicidal/homicidal ideation (-), feeling of hopelessness (-)  PHYSICAL EXAMINATION:  BP 147/99  Pulse 56  Temp 98.4 F (36.9 C) (Oral)  Resp 20  Ht 5\' 11"  (1.803 m)  Wt 170 lb 14.4 oz (77.52 kg)  BMI 23.84 kg/m2 General: Patient is a well appearing male in no acute distress HEENT: PERRLA,  sclerae anicteric no conjunctival pallor, MMM Neck: supple, no palpable adenopathy Lungs: clear to auscultation bilaterally, no wheezes, rhonchi, or rales Cardiovascular: regular rate rhythm, S1, S2, no murmurs, rubs or gallops Abdomen: Soft, non-tender, non-distended, normoactive bowel sounds, no HSM Extremities: warm and well perfused, no clubbing, cyanosis, or edema Skin: No rashes or lesions Neuro: CN II-XII intact.  Normal gait, 5/5 strength in all extremities.   ECOG PERFORMANCE STATUS: 0 - Asymptomatic    LABORATORY DATA: Lab Results  Component Value Date   WBC 4.1 06/22/2012   HGB 13.5 06/22/2012   HCT 39.7 06/22/2012   MCV 99.3* 06/22/2012   PLT 160 06/22/2012      Chemistry      Component Value Date/Time   NA 142 06/08/2012 1138   NA 138 03/02/2012 1100   K 4.3 06/08/2012 1138   K 4.5 03/02/2012 1100   CL 108* 06/08/2012 1138   CL 102 03/02/2012 1100   CO2 27 06/08/2012 1138   CO2 23 03/02/2012 1100   BUN 20.0 06/08/2012 1138   BUN 25* 03/02/2012 1100   CREATININE 0.9 06/08/2012 1138   CREATININE 1.05 03/02/2012 1100      Component Value Date/Time   CALCIUM 9.0 06/08/2012 1138   CALCIUM 9.5 03/02/2012 1100   ALKPHOS 104 06/08/2012 1138   ALKPHOS 130* 03/02/2012 1100   AST 31 06/08/2012 1138   AST 39* 03/02/2012 1100   ALT 42 06/08/2012 1138   ALT 65* 03/02/2012 1100   BILITOT 0.42 06/08/2012 1138   BILITOT 0.4 03/02/2012 1100       RADIOGRAPHIC STUDIES:  No results found.  ASSESSMENT: 61 year old gentleman with:  1.  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 Overall he is doing well and tolerating his therapy quite well. He also is getting  Avastin and tolerating it very well.  2.  For his pulmonary embolus he is on Coumadin and he is very therapeutic with his INR. We are checking his INR is at [facility] with the results being faxed to [place] here at cancer center.  #3 shoulder pain I have recommended  that he try some Ibuprofen 200 mg twice a day for the next 4-5 days to see if he gets any relief if not then he certainly can go see his orthopedic surgeon.  #4 patient also tells me that in January they will be going to [place] for 14 weeks therefore we will need to make arrangements for him to receive his Avastin by an oncologist there. ER and he has contact as he has done this before.  PLAN:   1.  Proceed with treatment today.  We will see him back  on 12/2 for his next dose.    2.  left lower extremity edema secondary to history of DVT he is recommended to wear her Compression hose as well as keep his feet elevated.  INR is 3 today.  He will take his coumadin 10mg  daily, and we will recheck it next week.    3. He will return in 2 weeks' time for his next dose of Avastin and appointment.    All questions were answered. The patient knows to call the clinic with any problems, questions or concerns. We can certainly see the patient much sooner if necessary.  I spent >25 minutes counseling the patient face to face. The total time spent in the appointment was 30 minutes.  This case was reviewed with Dr. Humphrey Rolls.  Roger Raymond, [place] Phone: [phone]    06/22/2012, 12:47 PM

## 2012-06-22 NOTE — Progress Notes (Signed)
Patient's bp 163/102 upon arrival to infusion room. Given clonidine 0.2mg . bp at end of treatment  141/90. Patient instructed to call primary with these results.

## 2012-06-22 NOTE — Patient Instructions (Addendum)
Doing well.  Proceed with chemotherapy.  Take 10mg  daily Coumadin.  We will follow up on your lab results next week.  Please call us if you have any questions or concerns.

## 2012-06-29 ENCOUNTER — Encounter: Payer: Self-pay | Admitting: Oncology

## 2012-07-06 ENCOUNTER — Encounter: Payer: Self-pay | Admitting: Adult Health

## 2012-07-06 ENCOUNTER — Other Ambulatory Visit (HOSPITAL_BASED_OUTPATIENT_CLINIC_OR_DEPARTMENT_OTHER): Payer: 59

## 2012-07-06 ENCOUNTER — Ambulatory Visit (HOSPITAL_BASED_OUTPATIENT_CLINIC_OR_DEPARTMENT_OTHER): Payer: 59

## 2012-07-06 ENCOUNTER — Ambulatory Visit (HOSPITAL_BASED_OUTPATIENT_CLINIC_OR_DEPARTMENT_OTHER): Payer: 59 | Admitting: Adult Health

## 2012-07-06 VITALS — BP 143/92 | HR 64 | Temp 97.4°F | Resp 20 | Ht 71.0 in | Wt 165.7 lb

## 2012-07-06 VITALS — BP 132/87 | HR 73 | Temp 98.4°F | Resp 18

## 2012-07-06 DIAGNOSIS — C711 Malignant neoplasm of frontal lobe: Secondary | ICD-10-CM

## 2012-07-06 DIAGNOSIS — C710 Malignant neoplasm of cerebrum, except lobes and ventricles: Secondary | ICD-10-CM

## 2012-07-06 DIAGNOSIS — I82409 Acute embolism and thrombosis of unspecified deep veins of unspecified lower extremity: Secondary | ICD-10-CM

## 2012-07-06 DIAGNOSIS — C719 Malignant neoplasm of brain, unspecified: Secondary | ICD-10-CM

## 2012-07-06 DIAGNOSIS — I10 Essential (primary) hypertension: Secondary | ICD-10-CM

## 2012-07-06 DIAGNOSIS — Z86718 Personal history of other venous thrombosis and embolism: Secondary | ICD-10-CM

## 2012-07-06 DIAGNOSIS — Z5112 Encounter for antineoplastic immunotherapy: Secondary | ICD-10-CM

## 2012-07-06 DIAGNOSIS — Z86711 Personal history of pulmonary embolism: Secondary | ICD-10-CM

## 2012-07-06 LAB — CBC WITH DIFFERENTIAL/PLATELET
BASO%: 0.7 % (ref 0.0–2.0)
Basophils Absolute: 0 10*3/uL (ref 0.0–0.1)
EOS%: 3.4 % (ref 0.0–7.0)
HCT: 44.8 % (ref 38.4–49.9)
HGB: 15.2 g/dL (ref 13.0–17.1)
LYMPH%: 25.2 % (ref 14.0–49.0)
MCH: 35.2 pg — ABNORMAL HIGH (ref 27.2–33.4)
MCHC: 34 g/dL (ref 32.0–36.0)
MONO#: 0.4 10*3/uL (ref 0.1–0.9)
NEUT%: 61 % (ref 39.0–75.0)
Platelets: 164 10*3/uL (ref 140–400)
lymph#: 1 10*3/uL (ref 0.9–3.3)

## 2012-07-06 LAB — COMPREHENSIVE METABOLIC PANEL (CC13)
BUN: 20 mg/dL (ref 7.0–26.0)
CO2: 28 mEq/L (ref 22–29)
Calcium: 9.6 mg/dL (ref 8.4–10.4)
Chloride: 104 mEq/L (ref 98–107)
Creatinine: 1 mg/dL (ref 0.7–1.3)
Total Bilirubin: 0.44 mg/dL (ref 0.20–1.20)

## 2012-07-06 LAB — PROTIME-INR: Protime: 34.8 Seconds — ABNORMAL HIGH (ref 10.6–13.4)

## 2012-07-06 LAB — UA PROTEIN, DIPSTICK - CHCC: Protein, ur: <30 mg/dL

## 2012-07-06 MED ORDER — SODIUM CHLORIDE 0.9 % IV SOLN
Freq: Once | INTRAVENOUS | Status: AC
  Administered 2012-07-06: 14:00:00 via INTRAVENOUS

## 2012-07-06 MED ORDER — HEPARIN SOD (PORK) LOCK FLUSH 100 UNIT/ML IV SOLN
500.0000 [IU] | Freq: Once | INTRAVENOUS | Status: AC
  Administered 2012-07-06: 500 [IU] via INTRAVENOUS
  Filled 2012-07-06: qty 5

## 2012-07-06 MED ORDER — LISINOPRIL 5 MG PO TABS: 5.0000 mg | ORAL_TABLET | Freq: Every day | ORAL | Status: DC

## 2012-07-06 MED ORDER — SODIUM CHLORIDE 0.9 % IJ SOLN
10.0000 mL | INTRAMUSCULAR | Status: DC | PRN
  Administered 2012-07-06: 10 mL via INTRAVENOUS
  Filled 2012-07-06: qty 10

## 2012-07-06 MED ORDER — SODIUM CHLORIDE 0.9 % IV SOLN
10.0000 mg/kg | Freq: Once | INTRAVENOUS | Status: AC
  Administered 2012-07-06: 725 mg via INTRAVENOUS
  Filled 2012-07-06: qty 29

## 2012-07-06 NOTE — Progress Notes (Signed)
OFFICE PROGRESS NOTE  Dr. 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 The tumor chemotherapy consisted of Temodar 75 mg per meter squared.  #2 patient was then started on Temodar given days 1 through 5 every 28 days with concomitant Avastin given every 2 weeks.  #3 patient also developed lower extremity DVT as well as as pulmonary embolism he was started on Coumadin to try to maintain an INR between 2 and 2.5.  CURRENT THERAPY: Patient is here for his scheduled Avastin Day 1  INTERVAL HISTORY: Roger Raymond 61 y.o. male returns for followup visit today for his treatment. He will start his Temodar today.  He developed a rash that started two weeks ago and is getting worse.  It is on his abdomen and no where else.  It does itch.  His epistaxis has improved.  Otherwise he is concerned about his blood pressure, but is doing well and w/o questions/concerns.      MEDICAL HISTORY: Past Medical History  Diagnosis Date  . 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 DVT (deep venous thrombosis) 07/08/2011    ALLERGIES:   has no known allergies.  MEDICATIONS:  Current Outpatient Prescriptions  Medication Sig Dispense Refill  . acetaminophen (TYLENOL) 325 MG tablet Take 650 mg by mouth every 6 (six) hours as needed.        . chlorproMAZINE (THORAZINE) 25 MG tablet Take 25 mg by mouth every 6 (six) hours as needed.        . diphenoxylate-atropine (LOMOTIL) 2.5-0.025 MG per tablet Take 1 tablet by mouth 4 (four) times daily as needed. 1-2 tabs prn       . levETIRAcetam (KEPPRA) 500 MG tablet Take 500 mg by mouth 2 (two) times daily.         [name] lidocaine-prilocaine (EMLA) cream Apply topically as needed. Apply to port 1-2 hours before procedure       . ondansetron (ZOFRAN) 8 MG tablet Take 1 tablet (8 mg total) by mouth every 12 (twelve) hours as needed.  234 tablet  1  . ondansetron (ZOFRAN) 8 MG tablet Take 1 tablet (8 mg total) by mouth every 8 (eight) hours as needed for nausea.  20 tablet  12  . sodium chloride (OCEAN) 0.65 % nasal spray Place 1 spray into the nose as needed.      . temozolomide (TEMODAR) 250 MG capsule Take 280 mg by mouth daily. May take on an empty stomach or at bedtime to decrease nausea & vomiting.  Take x5 days then off  x3 wks      . warfarin (COUMADIN) 10 MG tablet TAKE 1 TAB ON MON,WED, FRI,--TAKE 1 & 1/2 TABS ALL OTHER DAYS  30 tablet  2  . warfarin (COUMADIN) 5 MG tablet Take 1 tablet (5 mg total) by mouth daily. Or as directed by physician  64 tablet  0   Current Facility-Administered Medications  Medication Dose Route Frequency Provider Last Rate Last Dose  . lisinopril (PRINIVIL,ZESTRIL) tablet 5 mg  5 mg Oral Daily Oneida Alar, NP        SURGICAL HISTORY:  Past Surgical History  Procedure Date  .  Vasectomy     REVIEW OF SYSTEMS:  General: fatigue (-), night sweats (-), fever (-), pain (-) Lymph: palpable nodes (-) HEENT: vision changes (-), mucositis (-), gum bleeding (-), epistaxis (+) Cardiovascular: chest pain (-), palpitations (-) Pulmonary: shortness of breath (-), dyspnea on exertion (-), cough (-), hemoptysis (-) GI:  Early satiety (-), melena (-), dysphagia (-), nausea/vomiting (-), diarrhea (-) GU: dysuria (-), hematuria (-), incontinence (-) Musculoskeletal: joint swelling (-), joint pain (+), back pain (-) Neuro: weakness (-), numbness (-), headache (-), confusion (-) Skin: Rash (-), lesions (-), dryness (-) Psych: depression (-), suicidal/homicidal ideation (-), feeling of hopelessness (-)  PHYSICAL EXAMINATION:  BP 143/92  Pulse 64  Temp 97.4 F (36.3 C)  (Oral)  Resp 20  Ht 5\' 11"  (1.803 m)  Wt 165 lb 11.2 oz (75.161 kg)  BMI 23.11 kg/m2 General: Patient is a well appearing male in no acute distress HEENT: PERRLA, sclerae anicteric no conjunctival pallor, MMM Neck: supple, no palpable adenopathy Lungs: clear to auscultation bilaterally, no wheezes, rhonchi, or rales Cardiovascular: regular rate rhythm, S1, S2, no murmurs, rubs or gallops Abdomen: Soft, non-tender, non-distended, normoactive bowel sounds, no HSM Extremities: warm and well perfused, no clubbing, cyanosis, or edema Skin: No rashes or lesions Neuro: CN II-XII intact.  Normal gait, 5/5 strength in all extremities.   ECOG PERFORMANCE STATUS: 0 - Asymptomatic    LABORATORY DATA: Lab Results  Component Value Date   WBC 4.1 06/22/2012   HGB 13.5 06/22/2012   HCT 39.7 06/22/2012   MCV 99.3* 06/22/2012   PLT 160 06/22/2012      Chemistry      Component Value Date/Time   NA 141 06/22/2012 1155   NA 138 03/02/2012 1100   K 3.8 06/22/2012 1155   K 4.5 03/02/2012 1100   CL 108* 06/22/2012 1155   CL 102 03/02/2012 1100   CO2 27 06/22/2012 1155   CO2 23 03/02/2012 1100   BUN 13.0 06/22/2012 1155   BUN 25* 03/02/2012 1100   CREATININE 0.8 06/22/2012 1155   CREATININE 1.05 03/02/2012 1100      Component Value Date/Time   CALCIUM 8.9 06/22/2012 1155   CALCIUM 9.5 03/02/2012 1100   ALKPHOS 108 06/22/2012 1155   ALKPHOS 130* 03/02/2012 1100   AST 33 06/22/2012 1155   AST 39* 03/02/2012 1100   ALT 45 06/22/2012 1155   ALT 65* 03/02/2012 1100   BILITOT 0.49 06/22/2012 1155   BILITOT 0.4 03/02/2012 1100       RADIOGRAPHIC STUDIES:  No results found.  ASSESSMENT: 61 year old gentleman with:  1.  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 Overall he is doing well and tolerating his therapy quite well. He also is getting  Avastin and tolerating it very well.  2.  For his pulmonary embolus he is on Coumadin and he  is very therapeutic with his INR. We are checking his INR is at [facility] with the results being faxed to [place] here at cancer center.  #3 shoulder pain I have recommended that he try some Ibuprofen 200 mg twice a day for the next 4-5 days to see if he gets any relief if not then he certainly can go see his orthopedic surgeon.  #4 patient also tells me that in January they will be going to [place] for 14 weeks therefore we will need to make arrangements for him to receive his Avastin by an  oncologist there. ER and he has contact as he has done this before.  #5 Hypertension  PLAN:   1.  Proceed with treatment today.  We will see him back on 12/16 for his next dose  2.  left lower extremity edema secondary to history of DVT he is recommended to wear her Compression hose as well as keep his feet elevated.  INR is 2.9 today.  He will take his coumadin 10mg  daily, will recheck at next appt.  3. I have sent in a prescription for Lisinopril for his hypertension.  We discussed the medication, and I gave them a patient education handout.    4. He will return in 2 weeks' time for his next dose of Avastin and appointment.    All questions were answered. The patient knows to call the clinic with any problems, questions or concerns. We can certainly see the patient much sooner if necessary.  I spent >25 minutes counseling the patient face to face. The total time spent in the appointment was 30 minutes.  This case was reviewed with Dr. Humphrey Rolls.  Suzan Garibaldi Sheppard Coil, [place] Phone: [phone]    07/06/2012, 12:49 PM

## 2012-07-06 NOTE — Patient Instructions (Addendum)
Bevacizumab injection What is this medicine? BEVACIZUMAB (be va SIZ yoo mab) is a chemotherapy drug. It targets a protein found in many cancer cell types, and halts cancer growth. This drug treats many cancers including non-small cell lung cancer, and colon or rectal cancer. It is usually given with other chemotherapy drugs. This medicine may be used for other purposes; ask your health care provider or pharmacist if you have questions. What should I tell my health care provider before I take this medicine? They need to know if you have any of these conditions: -blood clots -heart disease, including heart failure, heart attack, or chest pain (angina) -high blood pressure -infection (especially a virus infection such as chickenpox, cold sores, or herpes) -kidney disease -lung disease -prior chemotherapy with doxorubicin, daunorubicin, epirubicin, or other anthracycline type chemotherapy agents -recent or ongoing radiation therapy -recent surgery -stroke -an unusual or allergic reaction to bevacizumab, hamster proteins, mouse proteins, other medicines, foods, dyes, or preservatives -pregnant or trying to get pregnant -breast-feeding How should I use this medicine? This medicine is for infusion into a vein. It is given by a health care professional in a hospital or clinic setting. Talk to your pediatrician regarding the use of this medicine in children. Special care may be needed. Overdosage: If you think you have taken too much of this medicine contact a poison control center or emergency room at once. NOTE: This medicine is only for you. Do not share this medicine with others. What if I miss a dose? It is important not to miss your dose. Call your doctor or health care professional if you are unable to keep an appointment. What may interact with this medicine? Interactions are not expected. This list may not describe all possible interactions. Give your health care provider a list of all  the medicines, herbs, non-prescription drugs, or dietary supplements you use. Also tell them if you smoke, drink alcohol, or use illegal drugs. Some items may interact with your medicine. What should I watch for while using this medicine? Your condition will be monitored carefully while you are receiving this medicine. You will need important blood work and urine testing done while you are taking this medicine. During your treatment, let your health care professional know if you have any unusual symptoms, such as difficulty breathing. This medicine may rarely cause 'gastrointestinal perforation' (holes in the stomach, intestines or colon), a serious side effect requiring surgery to repair. This medicine should be started at least 28 days following major surgery and the site of the surgery should be totally healed. Check with your doctor before scheduling dental work or surgery while you are receiving this treatment. Talk to your doctor if you have recently had surgery or if you have a wound that has not healed. Do not become pregnant while taking this medicine. Women should inform their doctor if they wish to become pregnant or think they might be pregnant. There is a potential for serious side effects to an unborn child. Talk to your health care professional or pharmacist for more information. Do not breast-feed an infant while taking this medicine. This medicine has caused ovarian failure in some women. This medicine may interfere with the ability to have a child. You should talk to your doctor or health care professional if you are concerned about your fertility. What side effects may I notice from receiving this medicine? Side effects that you should report to your doctor or health care professional as soon as possible: -allergic reactions like skin   rash, itching or hives, swelling of the face, lips, or tongue -signs of infection - fever or chills, cough, sore throat, pain or trouble passing  urine -signs of decreased platelets or bleeding - bruising, pinpoint red spots on the skin, black, tarry stools, nosebleeds, blood in the urine -breathing problems -changes in vision -chest pain -confusion -jaw pain, especially after dental work -mouth sores -seizures -severe abdominal pain -severe headache -sudden numbness or weakness of the face, arm or leg -swelling of legs or ankles -symptoms of a stroke: change in mental awareness, inability to talk or move one side of the body (especially in patients with lung cancer) -trouble passing urine or change in the amount of urine -trouble speaking or understanding -trouble walking, dizziness, loss of balance or coordination Side effects that usually do not require medical attention (report to your doctor or health care professional if they continue or are bothersome): -constipation -diarrhea -dry skin -headache -loss of appetite -nausea, vomiting This list may not describe all possible side effects. Call your doctor for medical advice about side effects. You may report side effects to FDA at 1-800-FDA-1088. Where should I keep my medicine? This drug is given in a hospital or clinic and will not be stored at home. NOTE: This sheet is a summary. It may not cover all possible information. If you have questions about this medicine, talk to your doctor, pharmacist, or health care provider.  2012, Elsevier/Gold Standard. (06/22/2010 4:25:37 PM)  [facility] Discharge Instructions for Patients Receiving Chemotherapy  Today you received the following chemotherapy agents  To help prevent nausea and vomiting after your treatment, we encourage you to take your nausea medication   Take it as often as prescribed.   If you develop nausea and vomiting that is not controlled by your nausea medication, call the clinic. If it is after clinic hours your family physician or the after hours number for the clinic or go to the Emergency  Department.   BELOW ARE SYMPTOMS THAT SHOULD BE REPORTED IMMEDIATELY:  *FEVER GREATER THAN 100.5 F  *CHILLS WITH OR WITHOUT FEVER  NAUSEA AND VOMITING THAT IS NOT CONTROLLED WITH YOUR NAUSEA MEDICATION  *UNUSUAL SHORTNESS OF BREATH  *UNUSUAL BRUISING OR BLEEDING  TENDERNESS IN MOUTH AND THROAT WITH OR WITHOUT PRESENCE OF ULCERS  *URINARY PROBLEMS  *BOWEL PROBLEMS  UNUSUAL RASH Items with * indicate a potential emergency and should be followed up as soon as possible.  If this is your first treatment one of the nurses will contact you 24 hours after your treatment. Please let the nurse know about any problems that you may have experienced. Feel free to call the clinic you have any questions or concerns. The clinic phone number is [phone].   I have been informed and understand all the instructions given to me. I know to contact the clinic, my physician, or go to the Emergency Department if any problems should occur. I do not have any questions at this time, but understand that I may call the clinic during office hours   should I have any questions or need assistance in obtaining follow up care.    __________________________________________  _____________  __________ Signature of Patient or Authorized Representative            Date                   Time    __________________________________________ Nurse's Signature

## 2012-07-06 NOTE — Patient Instructions (Signed)
Doing well.  I have prescribed Lisinopril below for your blood pressure.  Please take Benadryl for your rash, call us if it worsens.  Please call us if you have any questions or concerns.     Lisinopril tablets What is this medicine? LISINOPRIL (lyse IN oh pril) is an ACE inhibitor. This medicine is used to treat high blood pressure and heart failure. It is also used to protect the heart immediately after a heart attack. This medicine may be used for other purposes; ask your health care provider or pharmacist if you have questions. What should I tell my health care provider before I take this medicine? They need to know if you have any of these conditions: -diabetes -heart or blood vessel disease -immune system disease like lupus or scleroderma -kidney disease -low blood pressure -previous swelling of the tongue, face, or lips with difficulty breathing, difficulty swallowing, hoarseness, or tightening of the throat -an unusual or allergic reaction to lisinopril, other ACE inhibitors, insect venom, foods, dyes, or preservatives -pregnant or trying to get pregnant -breast-feeding How should I use this medicine? Take this medicine by mouth with a glass of water. Follow the directions on your prescription label. You may take this medicine with or without food. Take your medicine at regular intervals. Do not stop taking this medicine except on the advice of your doctor or health care professional. Talk to your pediatrician regarding the use of this medicine in children. Special care may be needed. While this drug may be prescribed for children as young as 25 years of age for selected conditions, precautions do apply. Overdosage: If you think you have taken too much of this medicine contact a poison control center or emergency room at once. NOTE: This medicine is only for you. Do not share this medicine with others. What if I miss a dose? If you miss a dose, take it as soon as you can. If it is  almost time for your next dose, take only that dose. Do not take double or extra doses. What may interact with this medicine? -diuretics -lithium -NSAIDs, medicines for pain and inflammation, like ibuprofen or naproxen -over-the-counter herbal supplements like hawthorn -potassium salts or potassium supplements -salt substitutes This list may not describe all possible interactions. Give your health care provider a list of all the medicines, herbs, non-prescription drugs, or dietary supplements you use. Also tell them if you smoke, drink alcohol, or use illegal drugs. Some items may interact with your medicine. What should I watch for while using this medicine? Visit your doctor or health care professional for regular check ups. Check your blood pressure as directed. Ask your doctor what your blood pressure should be, and when you should contact him or her. Call your doctor or health care professional if you notice an irregular or fast heart beat. Women should inform their doctor if they wish to become pregnant or think they might be pregnant. There is a potential for serious side effects to an unborn child. Talk to your health care professional or pharmacist for more information. Check with your doctor or health care professional if you get an attack of severe diarrhea, nausea and vomiting, or if you sweat a lot. The loss of too much body fluid can make it dangerous for you to take this medicine. You may get drowsy or dizzy. Do not drive, use machinery, or do anything that needs mental alertness until you know how this drug affects you. Do not stand or sit up quickly,  especially if you are an older patient. This reduces the risk of dizzy or fainting spells. Alcohol can make you more drowsy and dizzy. Avoid alcoholic drinks. Avoid salt substitutes unless you are told otherwise by your doctor or health care professional. Do not treat yourself for coughs, colds, or pain while you are taking this medicine  without asking your doctor or health care professional for advice. Some ingredients may increase your blood pressure. What side effects may I notice from receiving this medicine? Side effects that you should report to your doctor or health care professional as soon as possible: -abdominal pain with or without nausea or vomiting -allergic reactions like skin rash or hives, swelling of the hands, feet, face, lips, throat, or tongue -dark urine -difficulty breathing -dizzy, lightheaded or fainting spell -fever or sore throat -irregular heart beat, chest pain -pain or difficulty passing urine -redness, blistering, peeling or loosening of the skin, including inside the mouth -unusually weak -yellowing of the eyes or skin Side effects that usually do not require medical attention (report to your doctor or health care professional if they continue or are bothersome): -change in taste -cough -decreased sexual function or desire -headache -sun sensitivity -tiredness This list may not describe all possible side effects. Call your doctor for medical advice about side effects. You may report side effects to FDA at 1-800-FDA-1088. Where should I keep my medicine? Keep out of the reach of children. Store at room temperature between 15 and 30 degrees C (59 and 86 degrees F). Protect from moisture. Keep container tightly closed. Throw away any unused medicine after the expiration date. NOTE: This sheet is a summary. It may not cover all possible information. If you have questions about this medicine, talk to your doctor, pharmacist, or health care provider.  2012, Elsevier/Gold Standard. (01/25/2008 5:36:32 PM)

## 2012-07-07 ENCOUNTER — Other Ambulatory Visit: Payer: 59 | Admitting: Lab

## 2012-07-07 ENCOUNTER — Ambulatory Visit: Payer: 59 | Admitting: Oncology

## 2012-07-13 ENCOUNTER — Encounter: Payer: Self-pay | Admitting: Medical Oncology

## 2012-07-13 ENCOUNTER — Telehealth: Payer: Self-pay | Admitting: Medical Oncology

## 2012-07-13 NOTE — Telephone Encounter (Signed)
Increase coumadin to 12.5 mg alternate with 10 mg  Recheck in 1 week

## 2012-07-13 NOTE — Telephone Encounter (Signed)
Received fax from [facility], results of PT 21.3/INR 1.8 dated 07/13/12. Per 12/2 office note patient currently taking coumadin 10 mg daily. Will review with MD.  F/U appt for lab/MD 07/20/12

## 2012-07-13 NOTE — Telephone Encounter (Signed)
Per MD, patient to increase coumadin to 12.5 mg and alternate with 10 mg. Patient expressed verbal understanding. All questions answered.

## 2012-07-20 ENCOUNTER — Ambulatory Visit (HOSPITAL_BASED_OUTPATIENT_CLINIC_OR_DEPARTMENT_OTHER): Payer: 59

## 2012-07-20 ENCOUNTER — Encounter: Payer: Self-pay | Admitting: Oncology

## 2012-07-20 ENCOUNTER — Other Ambulatory Visit (HOSPITAL_BASED_OUTPATIENT_CLINIC_OR_DEPARTMENT_OTHER): Payer: 59 | Admitting: Lab

## 2012-07-20 ENCOUNTER — Ambulatory Visit (HOSPITAL_BASED_OUTPATIENT_CLINIC_OR_DEPARTMENT_OTHER): Payer: 59 | Admitting: Oncology

## 2012-07-20 VITALS — BP 138/89 | HR 65 | Temp 97.6°F | Resp 20 | Ht 71.0 in | Wt 166.4 lb

## 2012-07-20 DIAGNOSIS — C711 Malignant neoplasm of frontal lobe: Secondary | ICD-10-CM

## 2012-07-20 DIAGNOSIS — I82409 Acute embolism and thrombosis of unspecified deep veins of unspecified lower extremity: Secondary | ICD-10-CM

## 2012-07-20 DIAGNOSIS — M25519 Pain in unspecified shoulder: Secondary | ICD-10-CM

## 2012-07-20 DIAGNOSIS — Z5112 Encounter for antineoplastic immunotherapy: Secondary | ICD-10-CM

## 2012-07-20 DIAGNOSIS — I2699 Other pulmonary embolism without acute cor pulmonale: Secondary | ICD-10-CM

## 2012-07-20 DIAGNOSIS — C719 Malignant neoplasm of brain, unspecified: Secondary | ICD-10-CM

## 2012-07-20 DIAGNOSIS — I10 Essential (primary) hypertension: Secondary | ICD-10-CM

## 2012-07-20 LAB — UA PROTEIN, DIPSTICK - CHCC: Protein, ur: 30 mg/dL

## 2012-07-20 LAB — CBC WITH DIFFERENTIAL/PLATELET
BASO%: 0.2 % (ref 0.0–2.0)
LYMPH%: 19.8 % (ref 14.0–49.0)
MCHC: 34.1 g/dL (ref 32.0–36.0)
MCV: 99.3 fL — ABNORMAL HIGH (ref 79.3–98.0)
MONO#: 0.3 10*3/uL (ref 0.1–0.9)
MONO%: 6.8 % (ref 0.0–14.0)
Platelets: 151 10*3/uL (ref 140–400)
RBC: 4.28 10*6/uL (ref 4.20–5.82)
RDW: 13.3 % (ref 11.0–14.6)
WBC: 4.2 10*3/uL (ref 4.0–10.3)
nRBC: 0 % (ref 0–0)

## 2012-07-20 MED ORDER — SODIUM CHLORIDE 0.9 % IV SOLN
10.0000 mg/kg | Freq: Once | INTRAVENOUS | Status: AC
  Administered 2012-07-20: 725 mg via INTRAVENOUS
  Filled 2012-07-20: qty 29

## 2012-07-20 MED ORDER — SODIUM CHLORIDE 0.9 % IV SOLN
Freq: Once | INTRAVENOUS | Status: AC
  Administered 2012-07-20: 14:00:00 via INTRAVENOUS

## 2012-07-20 MED ORDER — WARFARIN SODIUM 10 MG PO TABS: 10.0000 mg | ORAL_TABLET | Freq: Every day | ORAL | Status: DC

## 2012-07-20 MED ORDER — TEMOZOLOMIDE 140 MG PO CAPS: ORAL_CAPSULE | ORAL | Status: DC

## 2012-07-20 MED ORDER — ONDANSETRON HCL 8 MG PO TABS: 8.0000 mg | ORAL_TABLET | Freq: Two times a day (BID) | ORAL | Status: DC | PRN

## 2012-07-20 MED ORDER — SODIUM CHLORIDE 0.9 % IJ SOLN
10.0000 mL | INTRAMUSCULAR | Status: DC | PRN
  Administered 2012-07-20: 10 mL
  Filled 2012-07-20: qty 10

## 2012-07-20 MED ORDER — HEPARIN SOD (PORK) LOCK FLUSH 100 UNIT/ML IV SOLN
500.0000 [IU] | Freq: Once | INTRAVENOUS | Status: AC | PRN
  Administered 2012-07-20: 500 [IU]
  Filled 2012-07-20: qty 5

## 2012-07-20 NOTE — Patient Instructions (Signed)
Cancer Center Discharge Instructions for Patients Receiving Chemotherapy  Today you received the following chemotherapy agents Avastin  To help prevent nausea and vomiting after your treatment, we encourage you to take your nausea medication as prescribed.   If you develop nausea and vomiting that is not controlled by your nausea medication, call the clinic. If it is after clinic hours your family physician or the after hours number for the clinic or go to the Emergency Department.   BELOW ARE SYMPTOMS THAT SHOULD BE REPORTED IMMEDIATELY:  *FEVER GREATER THAN 100.5 F  *CHILLS WITH OR WITHOUT FEVER  NAUSEA AND VOMITING THAT IS NOT CONTROLLED WITH YOUR NAUSEA MEDICATION  *UNUSUAL SHORTNESS OF BREATH  *UNUSUAL BRUISING OR BLEEDING  TENDERNESS IN MOUTH AND THROAT WITH OR WITHOUT PRESENCE OF ULCERS  *URINARY PROBLEMS  *BOWEL PROBLEMS  UNUSUAL RASH Items with * indicate a potential emergency and should be followed up as soon as possible.  Feel free to call the clinic you have any questions or concerns. The clinic phone number is [phone].   I have been informed and understand all the instructions given to me. I know to contact the clinic, my physician, or go to the Emergency Department if any problems should occur. I do not have any questions at this time, but understand that I may call the clinic during office hours   should I have any questions or need assistance in obtaining follow up care.  

## 2012-07-20 NOTE — Patient Instructions (Addendum)
Proceed with avastin   Refills given

## 2012-07-20 NOTE — Progress Notes (Signed)
OFFICE PROGRESS NOTE  Dr. 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 The tumor chemotherapy consisted of Temodar 75 mg per meter squared.  #2 patient was then started on Temodar given days 1 through 5 every 28 days with concomitant Avastin given every 2 weeks.  #3 patient also developed lower extremity DVT as well as as pulmonary embolism he was started on Coumadin to try to maintain an INR between 2 and 2.5.  CURRENT THERAPY: Patient is here for his scheduled Avastin Day 1  INTERVAL HISTORY: UNO ESAU 61 y.o. male returns for followup visit today for his treatment. It is on his abdomen and no where else. His epistaxis has improved.  Otherwise he is concerned about his blood pressure, but is doing well and w/o questions/concerns.      MEDICAL HISTORY: Past Medical History  Diagnosis Date  . 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 DVT (deep venous thrombosis) 07/08/2011    ALLERGIES:   has no known allergies.  MEDICATIONS:  Current Outpatient Prescriptions  Medication Sig Dispense Refill  . acetaminophen (TYLENOL) 325 MG tablet Take 650 mg by mouth every 6 (six) hours as needed.        . chlorproMAZINE (THORAZINE) 25 MG tablet Take 25 mg by mouth every 6 (six) hours as needed.        . diphenoxylate-atropine (LOMOTIL) 2.5-0.025 MG per tablet Take 1 tablet by mouth 4 (four) times daily as needed. 1-2 tabs prn       . levETIRAcetam (KEPPRA) 500 MG tablet Take 500 mg by mouth 2 (two) times daily.        [name] lidocaine-prilocaine (EMLA) cream Apply topically as needed. Apply to port 1-2 hours before procedure       .  lisinopril (PRINIVIL,ZESTRIL) 5 MG tablet Take 1 tablet (5 mg total) by mouth daily.  90 tablet  6  . ondansetron (ZOFRAN) 8 MG tablet Take 1 tablet (8 mg total) by mouth every 12 (twelve) hours as needed.  234 tablet  1  . ondansetron (ZOFRAN) 8 MG tablet Take 1 tablet (8 mg total) by mouth every 8 (eight) hours as needed for nausea.  20 tablet  12  . sodium chloride (OCEAN) 0.65 % nasal spray Place 1 spray into the nose as needed.      . temozolomide (TEMODAR) 250 MG capsule Take 280 mg by mouth daily. May take on an empty stomach or at bedtime to decrease nausea & vomiting.  Take x5 days then off  x3 wks      . warfarin (COUMADIN) 10 MG tablet TAKE 1 TAB ON MON,WED, FRI,--TAKE 1 & 1/2 TABS ALL OTHER DAYS  30 tablet  2  . warfarin (COUMADIN) 5 MG tablet Take 1 tablet (5 mg total) by mouth daily. Or as directed by physician  64 tablet  0    SURGICAL HISTORY:  Past Surgical History  Procedure Date  . Vasectomy     REVIEW OF SYSTEMS:  General: fatigue (-), night sweats (-), fever (-), pain (-) Lymph: palpable nodes (-) HEENT: vision changes (-), mucositis (-), gum bleeding (-), epistaxis (+) Cardiovascular: chest pain (-),  palpitations (-) Pulmonary: shortness of breath (-), dyspnea on exertion (-), cough (-), hemoptysis (-) GI:  Early satiety (-), melena (-), dysphagia (-), nausea/vomiting (-), diarrhea (-) GU: dysuria (-), hematuria (-), incontinence (-) Musculoskeletal: joint swelling (-), joint pain (+), back pain (-) Neuro: weakness (-), numbness (-), headache (-), confusion (-) Skin: Rash (-), lesions (-), dryness (-) Psych: depression (-), suicidal/homicidal ideation (-), feeling of hopelessness (-)  PHYSICAL EXAMINATION:  BP 138/89  Pulse 65  Temp 97.6 F (36.4 C) (Oral)  Resp 20  Ht 5\' 11"  (1.803 m)  Wt 166 lb 6.4 oz (75.479 kg)  BMI 23.21 kg/m2 General: Patient is a well appearing male in no acute distress HEENT: PERRLA, sclerae anicteric no conjunctival pallor,  MMM Neck: supple, no palpable adenopathy Lungs: clear to auscultation bilaterally, no wheezes, rhonchi, or rales Cardiovascular: regular rate rhythm, S1, S2, no murmurs, rubs or gallops Abdomen: Soft, non-tender, non-distended, normoactive bowel sounds, no HSM Extremities: warm and well perfused, no clubbing, cyanosis, or edema Skin: No rashes or lesions Neuro: CN II-XII intact.  Normal gait, 5/5 strength in all extremities.   ECOG PERFORMANCE STATUS: 0 - Asymptomatic    LABORATORY DATA: Lab Results  Component Value Date   WBC 4.2 07/20/2012   HGB 14.5 07/20/2012   HCT 42.5 07/20/2012   MCV 99.3* 07/20/2012   PLT 151 07/20/2012      Chemistry      Component Value Date/Time   NA 141 07/06/2012 1202   NA 138 03/02/2012 1100   K 5.1 07/06/2012 1202   K 4.5 03/02/2012 1100   CL 104 07/06/2012 1202   CL 102 03/02/2012 1100   CO2 28 07/06/2012 1202   CO2 23 03/02/2012 1100   BUN 20.0 07/06/2012 1202   BUN 25* 03/02/2012 1100   CREATININE 1.0 07/06/2012 1202   CREATININE 1.05 03/02/2012 1100      Component Value Date/Time   CALCIUM 9.6 07/06/2012 1202   CALCIUM 9.5 03/02/2012 1100   ALKPHOS 137 07/06/2012 1202   ALKPHOS 130* 03/02/2012 1100   AST 33 07/06/2012 1202   AST 39* 03/02/2012 1100   ALT 45 07/06/2012 1202   ALT 65* 03/02/2012 1100   BILITOT 0.44 07/06/2012 1202   BILITOT 0.4 03/02/2012 1100       RADIOGRAPHIC STUDIES:  No results found.  ASSESSMENT: 61 year old gentleman with:  1.  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 Overall he is doing well and tolerating his therapy quite well. He also is getting  Avastin and tolerating it very well.  2.  For his pulmonary embolus he is on Coumadin and he is very therapeutic with his INR. We are checking his INR is at [facility] with the results being faxed to [place] here at cancer center.  #3 shoulder pain I have recommended that he try some Ibuprofen 200 mg twice a day  for the next 4-5 days to see if he gets any relief if not then he certainly can go see his orthopedic surgeon.  #4 patient also tells me that in January they will be going to [place] for 14 weeks therefore we will need to make arrangements for him to receive his Avastin by an oncologist there. ER and he has contact as he has done this before.  #5 Hypertension  PLAN:   1.  Proceed with treatment today.    2.  left lower extremity edema secondary to history  of DVT he is recommended to wear her Compression hose as well as keep his feet elevated.  INR is 2.9 today.  He will take his coumadin 10 mg daily, will recheck at next appt.  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 He will return in 2 weeks' time for his next dose of Avastin and appointment.    All questions were answered. The patient knows to call the clinic with any problems, questions or concerns. We can certainly see the patient much sooner if necessary.  I spent >25 minutes counseling the patient face to face. The total time spent in the appointment was 30 minutes.  Marcy Panning, MD Medical/Oncology [facility] [phone] (beeper) [phone] (Office)  07/20/2012, 12:59 PM

## 2012-07-27 ENCOUNTER — Encounter: Payer: Self-pay | Admitting: Oncology

## 2012-08-03 ENCOUNTER — Ambulatory Visit (HOSPITAL_BASED_OUTPATIENT_CLINIC_OR_DEPARTMENT_OTHER): Payer: 59 | Admitting: Adult Health

## 2012-08-03 ENCOUNTER — Encounter: Payer: Self-pay | Admitting: Adult Health

## 2012-08-03 ENCOUNTER — Other Ambulatory Visit (HOSPITAL_BASED_OUTPATIENT_CLINIC_OR_DEPARTMENT_OTHER): Payer: 59

## 2012-08-03 ENCOUNTER — Ambulatory Visit (HOSPITAL_BASED_OUTPATIENT_CLINIC_OR_DEPARTMENT_OTHER): Payer: 59

## 2012-08-03 VITALS — BP 150/93 | HR 66 | Temp 97.1°F | Resp 20 | Ht 71.0 in | Wt 171.6 lb

## 2012-08-03 DIAGNOSIS — C711 Malignant neoplasm of frontal lobe: Secondary | ICD-10-CM

## 2012-08-03 DIAGNOSIS — M25519 Pain in unspecified shoulder: Secondary | ICD-10-CM

## 2012-08-03 DIAGNOSIS — I10 Essential (primary) hypertension: Secondary | ICD-10-CM

## 2012-08-03 DIAGNOSIS — Z86718 Personal history of other venous thrombosis and embolism: Secondary | ICD-10-CM

## 2012-08-03 DIAGNOSIS — C719 Malignant neoplasm of brain, unspecified: Secondary | ICD-10-CM

## 2012-08-03 DIAGNOSIS — Z5112 Encounter for antineoplastic immunotherapy: Secondary | ICD-10-CM

## 2012-08-03 DIAGNOSIS — I82409 Acute embolism and thrombosis of unspecified deep veins of unspecified lower extremity: Secondary | ICD-10-CM

## 2012-08-03 LAB — COMPREHENSIVE METABOLIC PANEL (CC13)
Alkaline Phosphatase: 121 U/L (ref 40–150)
CO2: 28 mEq/L (ref 22–29)
Creatinine: 0.8 mg/dL (ref 0.7–1.3)
Glucose: 90 mg/dl (ref 70–99)
Sodium: 141 mEq/L (ref 136–145)
Total Bilirubin: 0.4 mg/dL (ref 0.20–1.20)

## 2012-08-03 LAB — CBC WITH DIFFERENTIAL/PLATELET
Basophils Absolute: 0 10*3/uL (ref 0.0–0.1)
EOS%: 4.2 % (ref 0.0–7.0)
Eosinophils Absolute: 0.2 10*3/uL (ref 0.0–0.5)
LYMPH%: 19 % (ref 14.0–49.0)
MCH: 34.3 pg — ABNORMAL HIGH (ref 27.2–33.4)
MCV: 98.8 fL — ABNORMAL HIGH (ref 79.3–98.0)
MONO%: 6 % (ref 0.0–14.0)
NEUT#: 3 10*3/uL (ref 1.5–6.5)
Platelets: 140 10*3/uL (ref 140–400)
RBC: 4.31 10*6/uL (ref 4.20–5.82)
RDW: 13.1 % (ref 11.0–14.6)
nRBC: 0 % (ref 0–0)

## 2012-08-03 LAB — PROTIME-INR
INR: 4 — ABNORMAL HIGH (ref 2.00–3.50)
Protime: 48 Seconds — ABNORMAL HIGH (ref 10.6–13.4)

## 2012-08-03 LAB — UA PROTEIN, DIPSTICK - CHCC: Protein, ur: 30 mg/dL

## 2012-08-03 MED ORDER — SODIUM CHLORIDE 0.9 % IJ SOLN
10.0000 mL | INTRAMUSCULAR | Status: DC | PRN
  Administered 2012-08-03: 10 mL
  Filled 2012-08-03: qty 10

## 2012-08-03 MED ORDER — HEPARIN SOD (PORK) LOCK FLUSH 100 UNIT/ML IV SOLN
500.0000 [IU] | Freq: Once | INTRAVENOUS | Status: AC | PRN
  Administered 2012-08-03: 500 [IU]
  Filled 2012-08-03: qty 5

## 2012-08-03 MED ORDER — TEMOZOLOMIDE 140 MG PO CAPS: ORAL_CAPSULE | ORAL | Status: DC

## 2012-08-03 MED ORDER — SODIUM CHLORIDE 0.9 % IV SOLN
Freq: Once | INTRAVENOUS | Status: AC
  Administered 2012-08-03: 15:00:00 via INTRAVENOUS

## 2012-08-03 MED ORDER — LISINOPRIL 5 MG PO TABS: 10.0000 mg | ORAL_TABLET | Freq: Every day | ORAL | Status: DC

## 2012-08-03 MED ORDER — SODIUM CHLORIDE 0.9 % IV SOLN
10.0000 mg/kg | Freq: Once | INTRAVENOUS | Status: AC
  Administered 2012-08-03: 775 mg via INTRAVENOUS
  Filled 2012-08-03: qty 31

## 2012-08-03 NOTE — Progress Notes (Signed)
OFFICE PROGRESS NOTE  Dr. 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 The tumor chemotherapy consisted of Temodar 75 mg per meter squared.  #2 patient was then started on Temodar given days 1 through 5 every 28 days with concomitant Avastin given every 2 weeks.  #3 patient also developed lower extremity DVT as well as as pulmonary embolism he was started on Coumadin to try to maintain an INR between 2 and 2.5.  CURRENT THERAPY: Patient is here for his scheduled Avastin Day 1  INTERVAL HISTORY: Roger Raymond 61 y.o. male returns for followup visit today for his treatment. He will start his Temodar today.  He's doing well.  He denies weakness, numbness, vision changes, or any other concerns.  He's planning his 14 week trip to [place] and plans have been made for him to continue treatment while up there.  Otherwise he's doing well and w/o questions/concerns.    MEDICAL HISTORY: Past Medical History  Diagnosis Date  . 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 DVT (deep venous thrombosis) 07/08/2011    ALLERGIES:   has no known allergies.  MEDICATIONS:  Current Outpatient Prescriptions  Medication Sig Dispense Refill  . acetaminophen (TYLENOL) 325 MG tablet Take 650 mg by mouth every 6 (six) hours as needed.        . levETIRAcetam (KEPPRA) 500 MG tablet Take 500 mg by mouth 2 (two) times daily.        [name] lidocaine-prilocaine (EMLA) cream Apply topically as needed. Apply to port 1-2 hours before procedure       . lisinopril (PRINIVIL,ZESTRIL) 5 MG tablet Take 2 tablets (10 mg total) by mouth daily.  90 tablet  6  . ondansetron  (ZOFRAN) 8 MG tablet Take 1 tablet (8 mg total) by mouth every 12 (twelve) hours as needed.  234 tablet  7  . sodium chloride (OCEAN) 0.65 % nasal spray Place 1 spray into the nose as needed.      . temozolomide (TEMODAR) 140 MG capsule Take 2 capsules daily x 5 days then repeat every 28 days  10 capsule  10  . warfarin (COUMADIN) 10 MG tablet Take 1 tablet (10 mg total) by mouth daily.  60 tablet  7  . warfarin (COUMADIN) 5 MG tablet Take 1 tablet (5 mg total) by mouth daily. Or as directed by physician  64 tablet  0  . chlorproMAZINE (THORAZINE) 25 MG tablet Take 25 mg by mouth every 6 (six) hours as needed.        . diphenoxylate-atropine (LOMOTIL) 2.5-0.025 MG per tablet Take 1 tablet by mouth 4 (four) times daily as needed. 1-2 tabs prn         SURGICAL HISTORY:  Past Surgical History  Procedure Date  . Vasectomy     REVIEW OF SYSTEMS:  General: fatigue (-), night sweats (-), fever (-), pain (-) Lymph: palpable nodes (-) HEENT: vision changes (-), mucositis (-), gum bleeding (-), epistaxis (+) Cardiovascular: chest pain (-), palpitations (-) Pulmonary: shortness of breath (-), dyspnea on exertion (-), cough (-), hemoptysis (-) GI:  Early satiety (-), melena (-), dysphagia (-), nausea/vomiting (-), diarrhea (-)  GU: dysuria (-), hematuria (-), incontinence (-) Musculoskeletal: joint swelling (-), joint pain (+), back pain (-) Neuro: weakness (-), numbness (-), headache (-), confusion (-) Skin: Rash (-), lesions (-), dryness (-) Psych: depression (-), suicidal/homicidal ideation (-), feeling of hopelessness (-)  PHYSICAL EXAMINATION:  BP 150/93  Pulse 66  Temp 97.1 F (36.2 C)  Resp 20  Ht 5\' 11"  (1.803 m)  Wt 171 lb 9.6 oz (77.837 kg)  BMI 23.93 kg/m2 General: Patient is a well appearing male in no acute distress HEENT: PERRLA, sclerae anicteric no conjunctival pallor, MMM Neck: supple, no palpable adenopathy Lungs: clear to auscultation bilaterally, no wheezes, rhonchi, or  rales Cardiovascular: regular rate rhythm, S1, S2, no murmurs, rubs or gallops Abdomen: Soft, non-tender, non-distended, normoactive bowel sounds, no HSM Extremities: warm and well perfused, no clubbing, cyanosis, or edema Skin: No rashes or lesions Neuro: CN II-XII intact.  Normal gait, 5/5 strength in all extremities.   ECOG PERFORMANCE STATUS: 0 - Asymptomatic    LABORATORY DATA: Lab Results  Component Value Date   WBC 4.3 08/03/2012   HGB 14.8 08/03/2012   HCT 42.6 08/03/2012   MCV 98.8* 08/03/2012   PLT 140 08/03/2012      Chemistry      Component Value Date/Time   NA 141 07/06/2012 1202   NA 138 03/02/2012 1100   K 5.1 07/06/2012 1202   K 4.5 03/02/2012 1100   CL 104 07/06/2012 1202   CL 102 03/02/2012 1100   CO2 28 07/06/2012 1202   CO2 23 03/02/2012 1100   BUN 20.0 07/06/2012 1202   BUN 25* 03/02/2012 1100   CREATININE 1.0 07/06/2012 1202   CREATININE 1.05 03/02/2012 1100      Component Value Date/Time   CALCIUM 9.6 07/06/2012 1202   CALCIUM 9.5 03/02/2012 1100   ALKPHOS 137 07/06/2012 1202   ALKPHOS 130* 03/02/2012 1100   AST 33 07/06/2012 1202   AST 39* 03/02/2012 1100   ALT 45 07/06/2012 1202   ALT 65* 03/02/2012 1100   BILITOT 0.44 07/06/2012 1202   BILITOT 0.4 03/02/2012 1100       RADIOGRAPHIC STUDIES:  No results found.  ASSESSMENT: 61 year old gentleman with:  1.  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 Overall he is doing well and tolerating his therapy quite well. He also is getting  Avastin and tolerating it very well.  2.  For his pulmonary embolus he is on Coumadin and he is very therapeutic with his INR. We are checking his INR is at [facility] with the results being faxed to [place] here at cancer center.  #3 shoulder pain I have recommended that he try some Ibuprofen 200 mg twice a day for the next 4-5 days to see if he gets any relief if not then he certainly can go see his orthopedic  surgeon.  #4 patient also tells me that in January they will be going to [place] for 14 weeks therefore we will need to make arrangements for him to receive his Avastin by an oncologist there. ER and he has contact as he has done this before.  #5 Hypertension  PLAN:   1.  Proceed with treatment today.  We will see him back on 1/13 for his next dose  2.  left lower extremity edema secondary to history of DVT he is recommended to wear her Compression hose as well as keep his feet elevated.  INR  is 4 today.  He will take his coumadin 10mg  daily, will recheck at next appt.  3. I have increased his Lisinopril dose for his hypertension.  4. He will return in 2 weeks' time for his next dose of Avastin and appointment.    All questions were answered. The patient knows to call the clinic with any problems, questions or concerns. We can certainly see the patient much sooner if necessary.  I spent >25 minutes counseling the patient face to face. The total time spent in the appointment was 30 minutes.  Suzan Garibaldi Sheppard Coil, [place] Phone: [phone]    08/03/2012, 2:13 PM

## 2012-08-03 NOTE — Patient Instructions (Addendum)
Doing well.  Proceed with Avastin.  Start taking 10mg  Coumadin every night.  Please call us if you have any questions or concerns.

## 2012-08-03 NOTE — Patient Instructions (Addendum)
[facility] Discharge Instructions for Patients Receiving Chemotherapy  Today you received the following chemotherapy agents: Avastin.  To help prevent nausea and vomiting after your treatment, we encourage you to take your nausea medication.    If you develop nausea and vomiting that is not controlled by your nausea medication, call the clinic.   BELOW ARE SYMPTOMS THAT SHOULD BE REPORTED IMMEDIATELY:  *FEVER GREATER THAN 100.5 F  *CHILLS WITH OR WITHOUT FEVER  NAUSEA AND VOMITING THAT IS NOT CONTROLLED WITH YOUR NAUSEA MEDICATION  *UNUSUAL SHORTNESS OF BREATH  *UNUSUAL BRUISING OR BLEEDING  TENDERNESS IN MOUTH AND THROAT WITH OR WITHOUT PRESENCE OF ULCERS  *URINARY PROBLEMS  *BOWEL PROBLEMS  UNUSUAL RASH Items with * indicate a potential emergency and should be followed up as soon as possible.  Please let the nurse know about any problems that you may have experienced. Feel free to call the clinic you have any questions or concerns. The clinic phone number is [phone].   Bevacizumab injection What is this medicine? BEVACIZUMAB (be va SIZ yoo mab) is a chemotherapy drug. It targets a protein found in many cancer cell types, and halts cancer growth. This drug treats many cancers including non-small cell lung cancer, and colon or rectal cancer. It is usually given with other chemotherapy drugs. This medicine may be used for other purposes; ask your health care provider or pharmacist if you have questions. What should I tell my health care provider before I take this medicine? They need to know if you have any of these conditions: -blood clots -heart disease, including heart failure, heart attack, or chest pain (angina) -high blood pressure -infection (especially a virus infection such as chickenpox, cold sores, or herpes) -kidney disease -lung disease -prior chemotherapy with doxorubicin, daunorubicin, epirubicin, or other anthracycline type  chemotherapy agents -recent or ongoing radiation therapy -recent surgery -stroke -an unusual or allergic reaction to bevacizumab, hamster proteins, mouse proteins, other medicines, foods, dyes, or preservatives -pregnant or trying to get pregnant -breast-feeding How should I use this medicine? This medicine is for infusion into a vein. It is given by a health care professional in a hospital or clinic setting. Talk to your pediatrician regarding the use of this medicine in children. Special care may be needed. Overdosage: If you think you have taken too much of this medicine contact a poison control center or emergency room at once. NOTE: This medicine is only for you. Do not share this medicine with others. What if I miss a dose? It is important not to miss your dose. Call your doctor or health care professional if you are unable to keep an appointment. What may interact with this medicine? Interactions are not expected. This list may not describe all possible interactions. Give your health care provider a list of all the medicines, herbs, non-prescription drugs, or dietary supplements you use. Also tell them if you smoke, drink alcohol, or use illegal drugs. Some items may interact with your medicine. What should I watch for while using this medicine? Your condition will be monitored carefully while you are receiving this medicine. You will need important blood work and urine testing done while you are taking this medicine. During your treatment, let your health care professional know if you have any unusual symptoms, such as difficulty breathing. This medicine may rarely cause 'gastrointestinal perforation' (holes in the stomach, intestines or colon), a serious side effect requiring surgery to repair. This medicine should be started at least 28 days following  major surgery and the site of the surgery should be totally healed. Check with your doctor before scheduling dental work or surgery  while you are receiving this treatment. Talk to your doctor if you have recently had surgery or if you have a wound that has not healed. Do not become pregnant while taking this medicine. Women should inform their doctor if they wish to become pregnant or think they might be pregnant. There is a potential for serious side effects to an unborn child. Talk to your health care professional or pharmacist for more information. Do not breast-feed an infant while taking this medicine. This medicine has caused ovarian failure in some women. This medicine may interfere with the ability to have a child. You should talk to your doctor or health care professional if you are concerned about your fertility. What side effects may I notice from receiving this medicine? Side effects that you should report to your doctor or health care professional as soon as possible: -allergic reactions like skin rash, itching or hives, swelling of the face, lips, or tongue -signs of infection - fever or chills, cough, sore throat, pain or trouble passing urine -signs of decreased platelets or bleeding - bruising, pinpoint red spots on the skin, black, tarry stools, nosebleeds, blood in the urine -breathing problems -changes in vision -chest pain -confusion -jaw pain, especially after dental work -mouth sores -seizures -severe abdominal pain -severe headache -sudden numbness or weakness of the face, arm or leg -swelling of legs or ankles -symptoms of a stroke: change in mental awareness, inability to talk or move one side of the body (especially in patients with lung cancer) -trouble passing urine or change in the amount of urine -trouble speaking or understanding -trouble walking, dizziness, loss of balance or coordination Side effects that usually do not require medical attention (report to your doctor or health care professional if they continue or are bothersome): -constipation -diarrhea -dry skin -headache -loss of  appetite -nausea, vomiting This list may not describe all possible side effects. Call your doctor for medical advice about side effects. You may report side effects to FDA at 1-800-FDA-1088. Where should I keep my medicine? This drug is given in a hospital or clinic and will not be stored at home. NOTE: This sheet is a summary. It may not cover all possible information. If you have questions about this medicine, talk to your doctor, pharmacist, or health care provider.  2013, [employer]. (06/22/2010 4:25:37 PM)

## 2012-08-10 ENCOUNTER — Other Ambulatory Visit: Payer: 59 | Admitting: Lab

## 2012-08-17 ENCOUNTER — Ambulatory Visit (HOSPITAL_BASED_OUTPATIENT_CLINIC_OR_DEPARTMENT_OTHER): Payer: 59 | Admitting: Adult Health

## 2012-08-17 ENCOUNTER — Ambulatory Visit (HOSPITAL_BASED_OUTPATIENT_CLINIC_OR_DEPARTMENT_OTHER): Payer: 59

## 2012-08-17 ENCOUNTER — Encounter: Payer: Self-pay | Admitting: Adult Health

## 2012-08-17 ENCOUNTER — Telehealth: Payer: Self-pay | Admitting: *Deleted

## 2012-08-17 ENCOUNTER — Other Ambulatory Visit (HOSPITAL_BASED_OUTPATIENT_CLINIC_OR_DEPARTMENT_OTHER): Payer: 59

## 2012-08-17 VITALS — BP 150/103 | HR 60 | Resp 20 | Ht 71.0 in | Wt 169.5 lb

## 2012-08-17 VITALS — BP 148/92 | HR 63 | Temp 97.7°F | Resp 20

## 2012-08-17 DIAGNOSIS — M25519 Pain in unspecified shoulder: Secondary | ICD-10-CM

## 2012-08-17 DIAGNOSIS — Z5112 Encounter for antineoplastic immunotherapy: Secondary | ICD-10-CM

## 2012-08-17 DIAGNOSIS — I82409 Acute embolism and thrombosis of unspecified deep veins of unspecified lower extremity: Secondary | ICD-10-CM

## 2012-08-17 DIAGNOSIS — I2699 Other pulmonary embolism without acute cor pulmonale: Secondary | ICD-10-CM

## 2012-08-17 DIAGNOSIS — I10 Essential (primary) hypertension: Secondary | ICD-10-CM

## 2012-08-17 DIAGNOSIS — C711 Malignant neoplasm of frontal lobe: Secondary | ICD-10-CM

## 2012-08-17 DIAGNOSIS — C719 Malignant neoplasm of brain, unspecified: Secondary | ICD-10-CM

## 2012-08-17 LAB — PROTIME-INR

## 2012-08-17 LAB — CBC WITH DIFFERENTIAL/PLATELET
BASO%: 0.5 % (ref 0.0–2.0)
EOS%: 3.7 % (ref 0.0–7.0)
HCT: 48.2 % (ref 38.4–49.9)
LYMPH%: 17.5 % (ref 14.0–49.0)
MCH: 34.7 pg — ABNORMAL HIGH (ref 27.2–33.4)
MCHC: 30.9 g/dL — ABNORMAL LOW (ref 32.0–36.0)
MCV: 112.4 fL — ABNORMAL HIGH (ref 79.3–98.0)
MONO%: 6.8 % (ref 0.0–14.0)
NEUT%: 71.5 % (ref 39.0–75.0)
Platelets: 170 10*3/uL (ref 140–400)
RBC: 4.29 10*6/uL (ref 4.20–5.82)
nRBC: 0 % (ref 0–0)

## 2012-08-17 LAB — COMPREHENSIVE METABOLIC PANEL (CC13)
ALT: 36 U/L (ref 0–55)
CO2: 26 mEq/L (ref 22–29)
Calcium: 8.7 mg/dL (ref 8.4–10.4)
Chloride: 105 mEq/L (ref 98–107)
Creatinine: 0.9 mg/dL (ref 0.7–1.3)
Glucose: 93 mg/dl (ref 70–99)
Total Bilirubin: 0.56 mg/dL (ref 0.20–1.20)

## 2012-08-17 MED ORDER — CLONIDINE HCL 0.1 MG PO TABS: 0.2000 mg | ORAL_TABLET | Freq: Once | ORAL | Status: DC

## 2012-08-17 MED ORDER — HEPARIN SOD (PORK) LOCK FLUSH 100 UNIT/ML IV SOLN
500.0000 [IU] | Freq: Once | INTRAVENOUS | Status: AC
  Administered 2012-08-17: 500 [IU] via INTRAVENOUS
  Filled 2012-08-17: qty 5

## 2012-08-17 MED ORDER — SODIUM CHLORIDE 0.9 % IV SOLN
10.0000 mg/kg | Freq: Once | INTRAVENOUS | Status: AC
  Administered 2012-08-17: 775 mg via INTRAVENOUS
  Filled 2012-08-17: qty 31

## 2012-08-17 MED ORDER — LIDOCAINE-PRILOCAINE 2.5-2.5 % EX CREA: TOPICAL_CREAM | CUTANEOUS | Status: DC | PRN

## 2012-08-17 MED ORDER — SODIUM CHLORIDE 0.9 % IV SOLN
Freq: Once | INTRAVENOUS | Status: AC
  Administered 2012-08-17: 15:00:00 via INTRAVENOUS

## 2012-08-17 MED ORDER — SODIUM CHLORIDE 0.9 % IJ SOLN
10.0000 mL | INTRAMUSCULAR | Status: DC | PRN
  Administered 2012-08-17: 10 mL via INTRAVENOUS
  Filled 2012-08-17: qty 10

## 2012-08-17 NOTE — Telephone Encounter (Signed)
Gave patient appointment for 12-07-2012

## 2012-08-17 NOTE — Patient Instructions (Signed)
Doing well, proceed with Avastin.  Please call us if you have any questions or concerns.

## 2012-08-17 NOTE — Telephone Encounter (Signed)
Per staff message and POF I have scheduled appts.  JMW  

## 2012-08-17 NOTE — Patient Instructions (Signed)
Patient aware of next appointment; discharged home with no complaints. 

## 2012-08-17 NOTE — Progress Notes (Signed)
OFFICE PROGRESS NOTE  Dr. 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 The tumor chemotherapy consisted of Temodar 75 mg per meter squared.  #2 patient was then started on Temodar given days 1 through 5 every 28 days with concomitant Avastin given every 2 weeks.  #3 patient also developed lower extremity DVT as well as as pulmonary embolism he was started on Coumadin to try to maintain an INR between 2 and 2.5.  CURRENT THERAPY: Patient is here for his scheduled Avastin Day 15  INTERVAL HISTORY: CARLETON VANVALKENBURGH 62 y.o. male returns for followup visit today for his treatment.  He had a little bit more fatigue with his Temodar during this  cycle, however, he is doing well and without questions/concerns.  He is getting on a plane tomorrow for his 14 week trip to [place].  He denies fevers, chills, numbness, headaches, shortness of breath, GI upset, or any other concerns.    MEDICAL HISTORY: Past Medical History  Diagnosis Date  . 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 DVT (deep venous thrombosis) 07/08/2011    ALLERGIES:   has no known allergies.  MEDICATIONS:  Current Outpatient Prescriptions  Medication Sig Dispense Refill  . acetaminophen (TYLENOL) 325 MG tablet Take 650 mg by mouth every 6 (six) hours as needed.        . chlorproMAZINE (THORAZINE) 25 MG tablet Take 25 mg by mouth every 6 (six) hours as needed.        . diphenoxylate-atropine (LOMOTIL) 2.5-0.025 MG per tablet Take 1 tablet by mouth 4 (four) times daily as needed. 1-2 tabs prn       . levETIRAcetam (KEPPRA) 500 MG tablet Take 500 mg by mouth 2 (two) times  daily.        [name] lidocaine-prilocaine (EMLA) cream Apply topically as needed. Apply to port 1-2 hours before procedure  30 g  2  . lisinopril (PRINIVIL,ZESTRIL) 5 MG tablet Take 2 tablets (10 mg total) by mouth daily.  90 tablet  6  . ondansetron (ZOFRAN) 8 MG tablet Take 1 tablet (8 mg total) by mouth every 12 (twelve) hours as needed.  234 tablet  7  . sodium chloride (OCEAN) 0.65 % nasal spray Place 1 spray into the nose as needed.      . temozolomide (TEMODAR) 140 MG capsule Take 2 capsules daily x 5 days then repeat every 28 days  10 capsule  10  . warfarin (COUMADIN) 10 MG tablet Take 1 tablet (10 mg total) by mouth daily.  60 tablet  7  . warfarin (COUMADIN) 5 MG tablet Take 1 tablet (5 mg total) by mouth daily. Or as directed by physician  64 tablet  0   Current Facility-Administered Medications  Medication Dose Route Frequency Provider Last Rate Last Dose  . cloNIDine (CATAPRES) tablet 0.2 mg  0.2 mg Oral Once Oneida Alar, NP       Facility-Administered Medications Ordered in Other Visits  Medication Dose Route Frequency Provider Last Rate Last Dose  . bevacizumab (AVASTIN) 775 mg in sodium chloride 0.9 % 100 mL chemo infusion  10 mg/kg (Treatment Plan Actual) Intravenous Once [employer]  Sheppard Coil, NP        SURGICAL HISTORY:  Past Surgical History  Procedure Date  . Vasectomy     REVIEW OF SYSTEMS:  General: fatigue (+), night sweats (-), fever (-), pain (-) Lymph: palpable nodes (-) HEENT: vision changes (-), mucositis (-), gum bleeding (-), epistaxis (-) Cardiovascular: chest pain (-), palpitations (-) Pulmonary: shortness of breath (-), dyspnea on exertion (-), cough (-), hemoptysis (-) GI:  Early satiety (-), melena (-), dysphagia (-), nausea/vomiting (-), diarrhea (-) GU: dysuria (-), hematuria (-), incontinence (-) Musculoskeletal: joint swelling (-), joint pain (-), back pain (-) Neuro: weakness (-), numbness (-), headache (-), confusion (-) Skin: Rash (-),  lesions (-), dryness (-) Psych: depression (-), suicidal/homicidal ideation (-), feeling of hopelessness (-)  PHYSICAL EXAMINATION:  BP 150/103  Pulse 60  Resp 20  Ht 5\' 11"  (1.803 m)  Wt 169 lb 8 oz (76.885 kg)  BMI 23.64 kg/m2 General: Patient is a well appearing male in no acute distress HEENT: PERRLA, sclerae anicteric no conjunctival pallor, MMM Neck: supple, no palpable adenopathy Lungs: clear to auscultation bilaterally, no wheezes, rhonchi, or rales Cardiovascular: regular rate rhythm, S1, S2, no murmurs, rubs or gallops Abdomen: Soft, non-tender, non-distended, normoactive bowel sounds, no HSM Extremities: warm and well perfused, no clubbing, cyanosis, or edema Skin: No rashes or lesions Neuro: CN II-XII intact.  Normal gait, 5/5 strength in all extremities.   ECOG PERFORMANCE STATUS: 0 - Asymptomatic    LABORATORY DATA: Lab Results  Component Value Date   WBC 4.3 08/17/2012   HGB 14.9 08/17/2012   HCT 48.2 08/17/2012   MCV 112.4* 08/17/2012   PLT 170 08/17/2012      Chemistry      Component Value Date/Time   NA 141 08/03/2012 1251   NA 138 03/02/2012 1100   K 3.9 08/03/2012 1251   K 4.5 03/02/2012 1100   CL 106 08/03/2012 1251   CL 102 03/02/2012 1100   CO2 28 08/03/2012 1251   CO2 23 03/02/2012 1100   BUN 15.0 08/03/2012 1251   BUN 25* 03/02/2012 1100   CREATININE 0.8 08/03/2012 1251   CREATININE 1.05 03/02/2012 1100      Component Value Date/Time   CALCIUM 8.8 08/03/2012 1251   CALCIUM 9.5 03/02/2012 1100   ALKPHOS 121 08/03/2012 1251   ALKPHOS 130* 03/02/2012 1100   AST 41* 08/03/2012 1251   AST 39* 03/02/2012 1100   ALT 58* 08/03/2012 1251   ALT 65* 03/02/2012 1100   BILITOT 0.40 08/03/2012 1251   BILITOT 0.4 03/02/2012 1100       RADIOGRAPHIC STUDIES:  No results found.  ASSESSMENT: 62 year old gentleman with:  1.  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 Overall he is doing well and  tolerating his therapy quite well. He also is getting  Avastin and tolerating it very well.  2.  For his pulmonary embolus he is on Coumadin and he is very therapeutic with his INR. We are checking his INR is at [facility] with the results being faxed to [place] here at cancer center.  #3 shoulder pain I have recommended that he try some Ibuprofen 200 mg twice a day for the next 4-5 days to see if he gets any relief if not then he certainly can go see his orthopedic surgeon.  #4 patient also tells me that in January they will be going to [place] for 14 weeks therefore we  will need to make arrangements for him to receive his Avastin by an oncologist there. ER and he has contact as he has done this before.  #5 Hypertension  PLAN:   1.  Proceed with treatment today.  I have gotten him to sign a release so if we need to fax his MD in [place] any records, we will be able to do so.    2.  His INR is 1.9, he will take coumadin 10 mg 5 days a week and 12.5 mg two days per week.  I instructed him to get his INR drawn next week.    3. I have increased his Lisinopril dose for his hypertension.  He's still hypertensive today, we will give Clonidine and give the lisinopril another week or two to start working.    4. He will return in 2 weeks' time for his next dose of Avastin and appointment in [place].  We will see him back in May.    All questions were answered. The patient knows to call the clinic with any problems, questions or concerns. We can certainly see the patient much sooner if necessary.  I spent >25 minutes counseling the patient face to face. The total time spent in the appointment was 30 minutes.  Suzan Garibaldi Sheppard Coil, [place] Phone: [phone]    08/17/2012, 3:21 PM

## 2012-09-29 IMAGING — US IR US GUIDE VASC ACCESS RIGHT
1 series · 1 of 1 positions shown · non-contrast
Comparison: none

CLINICAL DATA: Brain malignancy, G B M

[Series 1: sp us guide vasc access*right* · 1 of 1 slices shown]
[im 1/1]
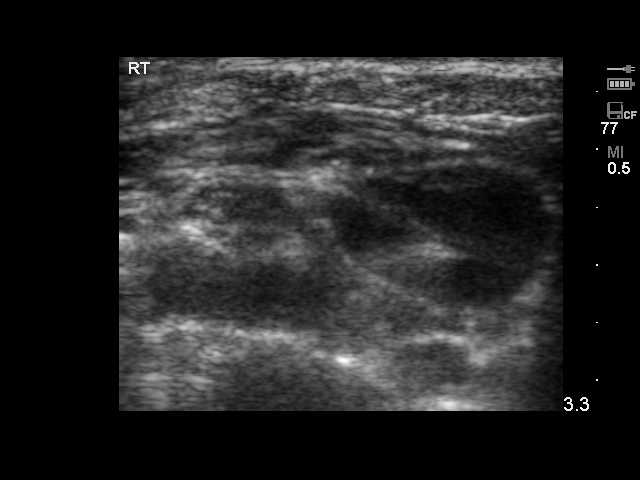

[1 of 1 positions shown; findings below may reference images not displayed]

ULTRASOUND GUIDANCE FOR VASCULAR ACCESS
RIGHT INTERNAL JUGULAR SINGLE LUMEN POWER PORT CATHETER INSERTION

Date: 08/20/2010 [DATE]

Radiologist:  Sibongiseni Teme, M.D.

Medications:  1 gram ancefadministered within 1 hour of the
procedure.2 mg Versed, 50 mcg Fentanyl

Guidance:  Ultrasound fluoroscopic

Fluoroscopy time:  0.6 minutes

Sedation time:  25 minutes

Contrast volume:  None.

Complications:  No immediate

PROCEDURE/FINDINGS:

Informed consent was obtained from the patient following
explanation of the procedure, risks, benefits and alternatives.
The patient understands, agrees and consents for the procedure.
All questions were addressed.  A time out was performed.

Maximal barrier sterile technique utilized including caps, mask,
sterile gowns, sterile gloves, large sterile drape, hand hygiene,
and 2% chlorhexidine scrub.

Under sterile conditions and local anesthesia, right internal
jugular micropuncture venous access was performed.  Access was
performed with ultrasound.  Images were obtained for documentation.
A guide wire was inserted followed by a transitional dilator.  This
allowed insertion of a guide wire and catheter into the IVC.
Measurements were obtained from the SVC / RA junction back to the
right IJ venotomy site.  In the right infraclavicular chest, a
subcutaneous pocket was created over the second anterior rib.  This
was done under sterile conditions and local anesthesia.  1%
lidocaine with epinephrine was utilized for this.  A 2.5 cm
incision was made in the skin.  Blunt dissection was performed to
create a subcutaneous pocket over the right pectoralis major
muscle.  The pocket was flushed with saline vigorously.  There was
adequate hemostasis.  The port catheter was assembled and checked
for leakage.  The port catheter was secured in the pocket with two
retention sutures.  The tubing was tunneled subcutaneously to the
right venotomy site and inserted into the SVC/RA junction through a
valved peel-away sheath.  Position was confirmed with fluoroscopy.
Images were obtained for documentation.  The patient tolerated the
procedure well.  No immediate complications.  Incisions were closed
in a two layer fashion with 4 - 0 Vicryl suture.  Dermabond was
applied to the skin. The port catheter was accessed, blood was
aspirated followed by saline and heparin flushes.  Needle was
removed.  A dry sterile dressing was applied.
IMPRESSION: Ultrasound and fluoroscopically guided right internal jugular
single lumen power port catheter insertion.  Tip in the SVC/RA
junction.  Catheter ready for use.

## 2012-09-29 IMAGING — XA IR US GUIDE VASC ACCESS RIGHT
1 series · 1 of 1 positions shown · non-contrast
Comparison: none

CLINICAL DATA: Brain malignancy, G B M

[Series 2: fl  angio · 1 of 1 slices shown]
[im 1/1]
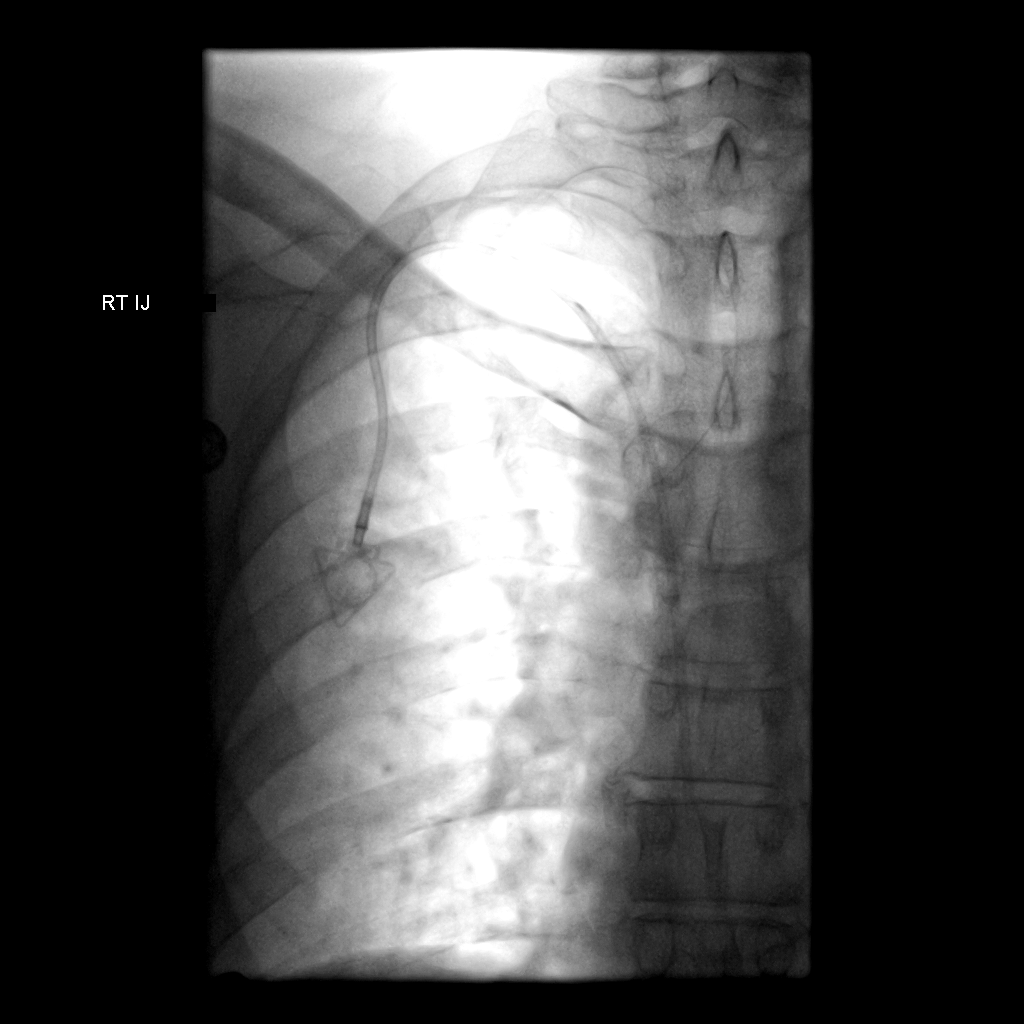

[1 of 1 positions shown; findings below may reference images not displayed]

ULTRASOUND GUIDANCE FOR VASCULAR ACCESS
RIGHT INTERNAL JUGULAR SINGLE LUMEN POWER PORT CATHETER INSERTION

Date: 08/20/2010 [DATE]

Radiologist:  Sibongiseni Teme, M.D.

Medications:  1 gram ancefadministered within 1 hour of the
procedure.2 mg Versed, 50 mcg Fentanyl

Guidance:  Ultrasound fluoroscopic

Fluoroscopy time:  0.6 minutes

Sedation time:  25 minutes

Contrast volume:  None.

Complications:  No immediate

PROCEDURE/FINDINGS:

Informed consent was obtained from the patient following
explanation of the procedure, risks, benefits and alternatives.
The patient understands, agrees and consents for the procedure.
All questions were addressed.  A time out was performed.

Maximal barrier sterile technique utilized including caps, mask,
sterile gowns, sterile gloves, large sterile drape, hand hygiene,
and 2% chlorhexidine scrub.

Under sterile conditions and local anesthesia, right internal
jugular micropuncture venous access was performed.  Access was
performed with ultrasound.  Images were obtained for documentation.
A guide wire was inserted followed by a transitional dilator.  This
allowed insertion of a guide wire and catheter into the IVC.
Measurements were obtained from the SVC / RA junction back to the
right IJ venotomy site.  In the right infraclavicular chest, a
subcutaneous pocket was created over the second anterior rib.  This
was done under sterile conditions and local anesthesia.  1%
lidocaine with epinephrine was utilized for this.  A 2.5 cm
incision was made in the skin.  Blunt dissection was performed to
create a subcutaneous pocket over the right pectoralis major
muscle.  The pocket was flushed with saline vigorously.  There was
adequate hemostasis.  The port catheter was assembled and checked
for leakage.  The port catheter was secured in the pocket with two
retention sutures.  The tubing was tunneled subcutaneously to the
right venotomy site and inserted into the SVC/RA junction through a
valved peel-away sheath.  Position was confirmed with fluoroscopy.
Images were obtained for documentation.  The patient tolerated the
procedure well.  No immediate complications.  Incisions were closed
in a two layer fashion with 4 - 0 Vicryl suture.  Dermabond was
applied to the skin. The port catheter was accessed, blood was
aspirated followed by saline and heparin flushes.  Needle was
removed.  A dry sterile dressing was applied.
IMPRESSION: Ultrasound and fluoroscopically guided right internal jugular
single lumen power port catheter insertion.  Tip in the SVC/RA
junction.  Catheter ready for use.

## 2012-11-11 IMAGING — CT CT ABD-PELV W/ CM
4 of 10 series · 16 of 37 positions shown, 17 images · IV contrast (APPLIED)
Comparison: None.

CLINICAL DATA: Glioblastoma

CT ANGIOGRAPHY CHEST WITH CONTRAST
TECHNIQUE: Multidetector CT imaging of the chest was performed
using the standard protocol during bolus administration of
intravenous contrast.  Multiplanar CT image reconstructions
including MIPs were obtained to evaluate the vascular anatomy.
Contrast:  100 ml Amnipaque-GAA
TECHNIQUE: Multidetector CT imaging of the abdomen and pelvis was
performed using the standard protocol following bolus
administration of intravenous contrast.

[Series 4: pe @ 3mm · axial · 0.62mm/px · z∈[-196,-38]mm · 4 of 89 slices shown]
[im 18/89  lung]
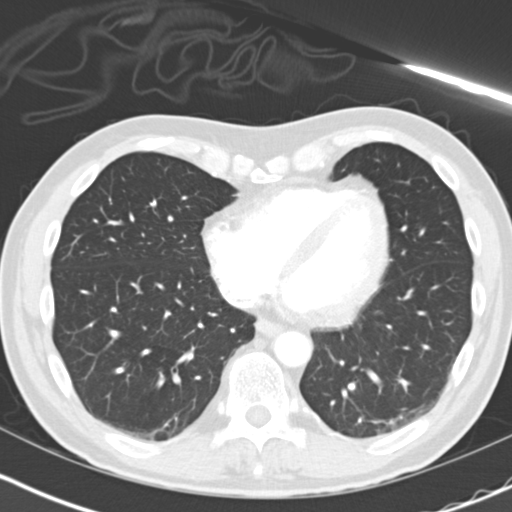
[im 36/89  lung]
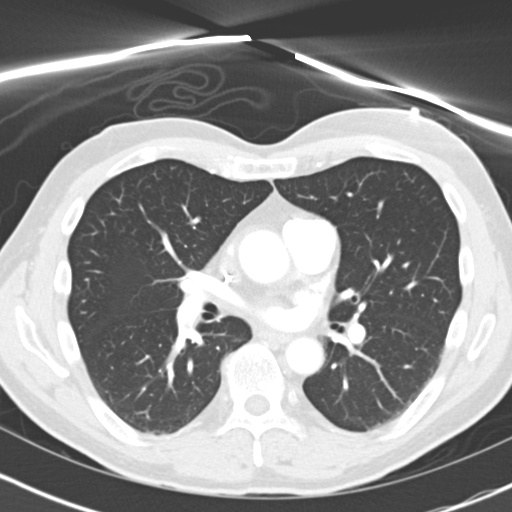
[im 53/89  lung]
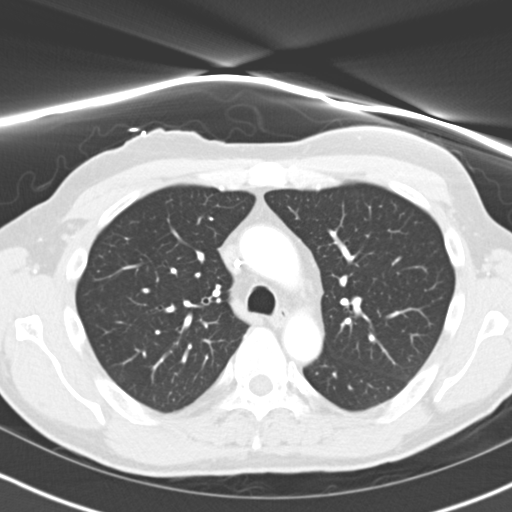
[im 71/89  lung]
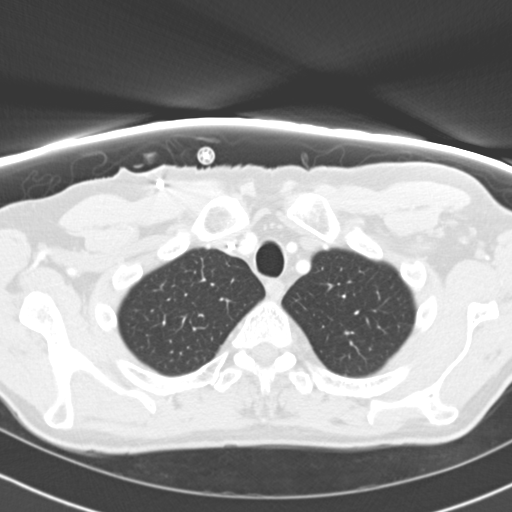

[Series 6: pe thins @ 1mm · axial · 0.62mm/px · z∈[-230,-2]mm · 8 of 266 slices shown]
[im 19/266  lung]
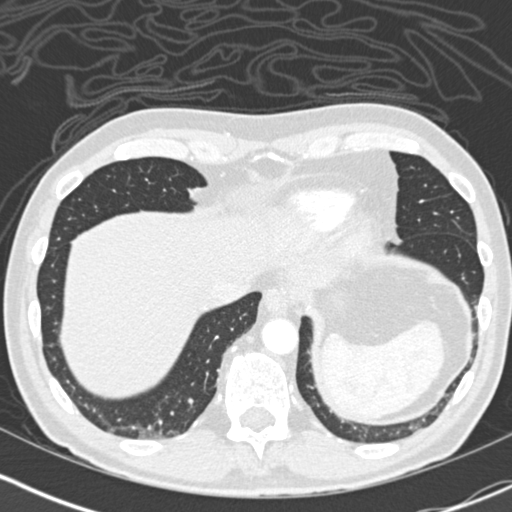
[im 57/266  lung]
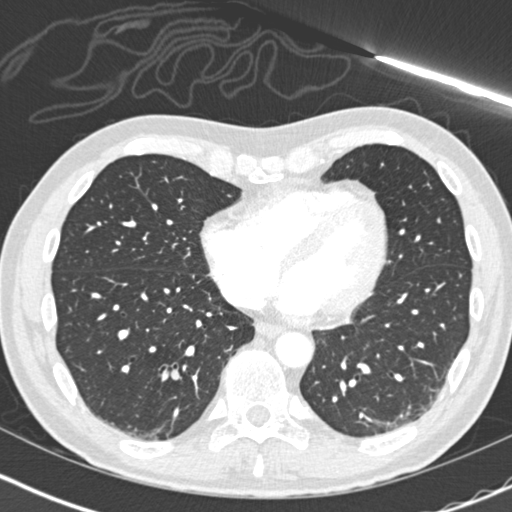
[im 95/266  lung]
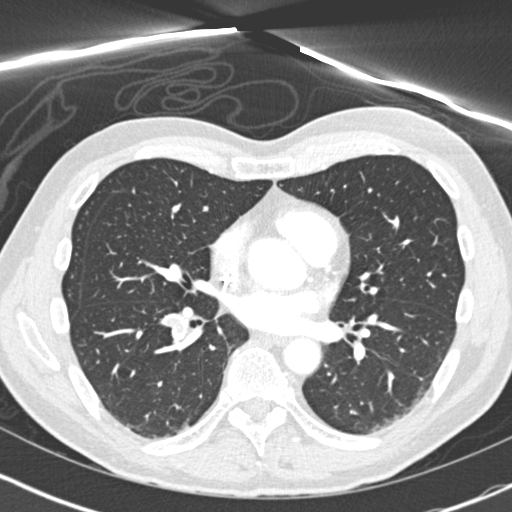
[im 114/266  lung]
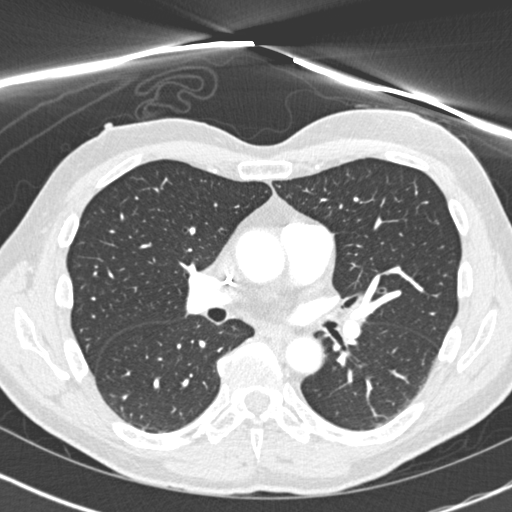
[im 152/266  lung]
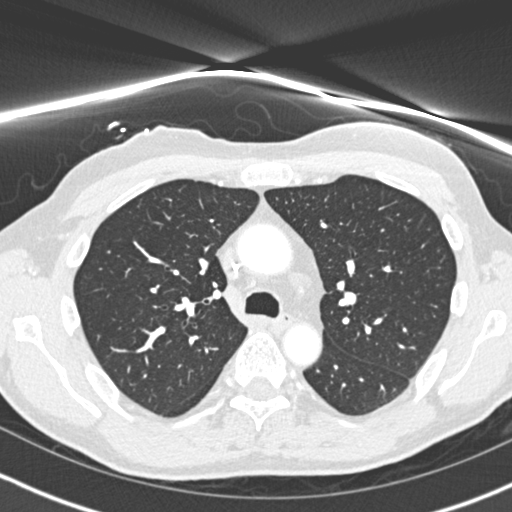
[im 171/266  lung]
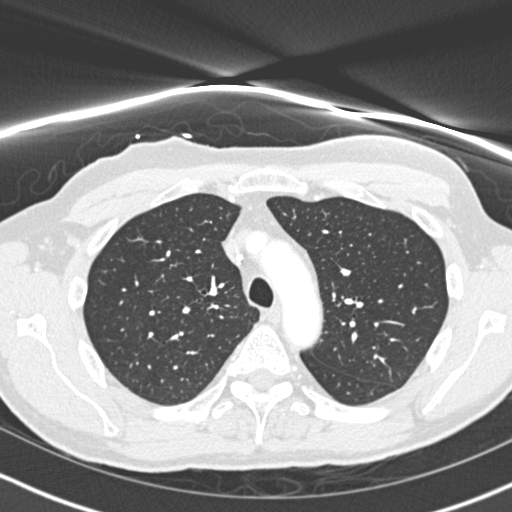
[im 209/266  lung]
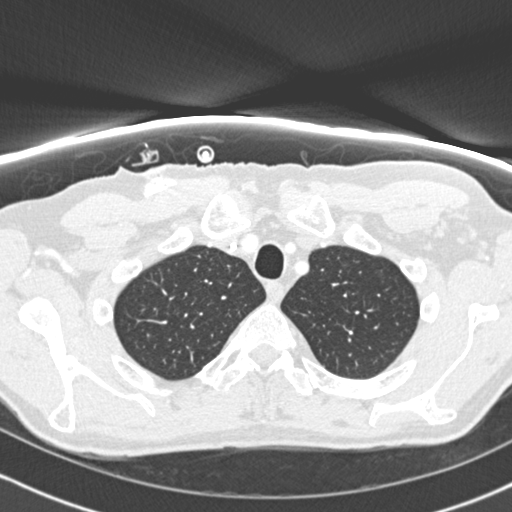
[im 247/266  lung]
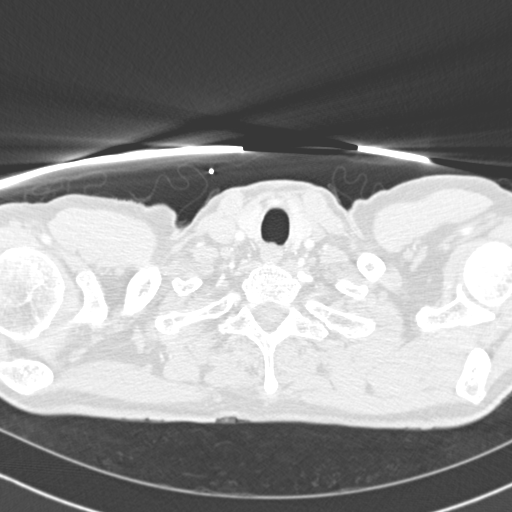

[Series 8: rtn a/p with · axial · 0.62mm/px · z∈[-503,-278]mm · 3 of 91 slices shown, 4 images]
[im 23/91  mediastinal]
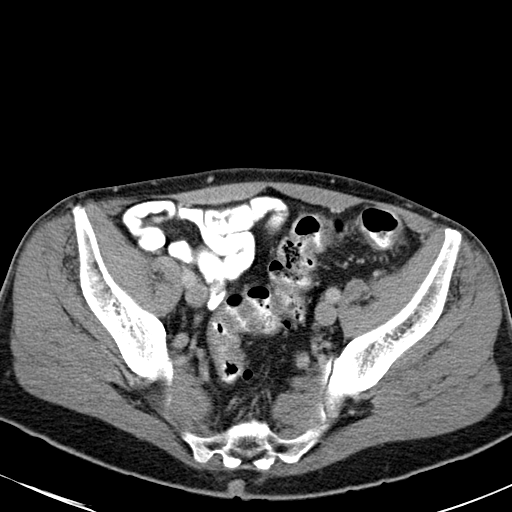
[im 23/91  lung]
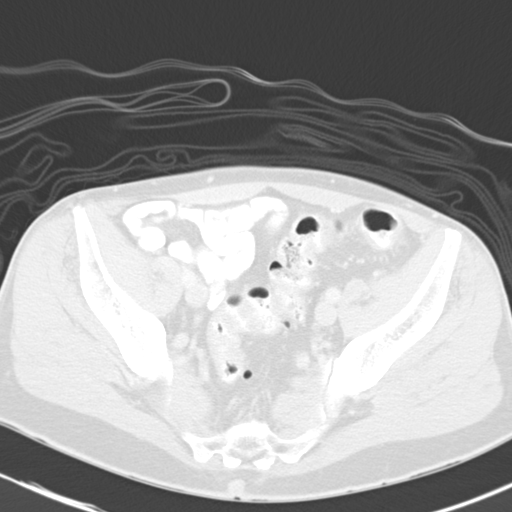
[im 46/91  lung]
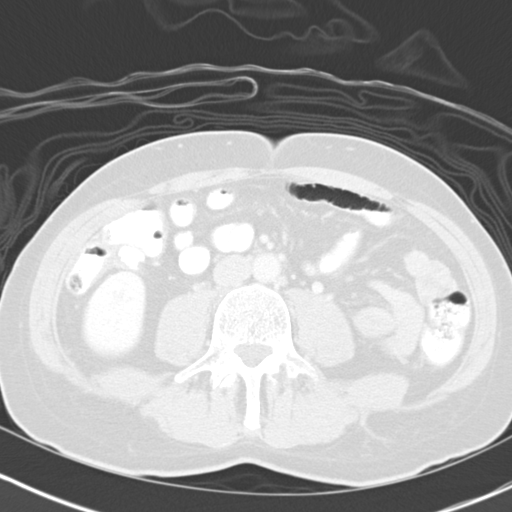
[im 68/91  lung]
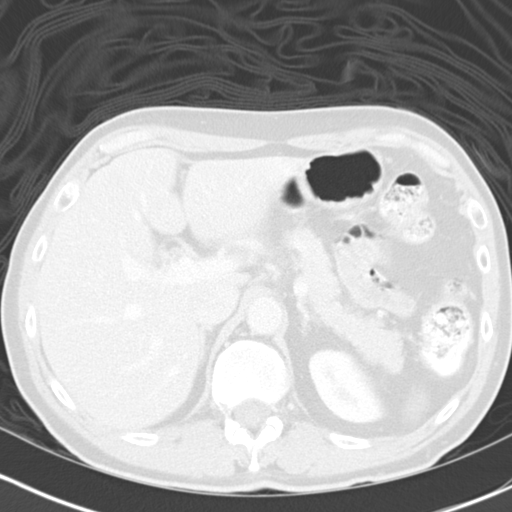

[Series 602: <mpr thick range> · coronal · 0.62mm/px · 1 of 121 slices shown]
[im 61/121  lung]
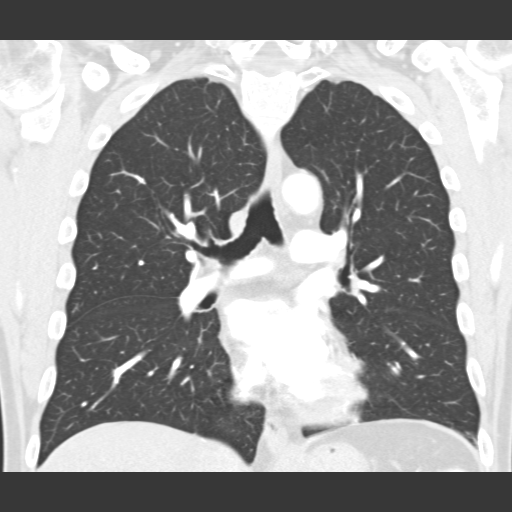

[16 of 37 positions shown; findings below may reference images not displayed]

FINDINGS: Low density filling defects are present in the right
lower lobe pulmonary artery lobe are branches well as segmental
branches in the posterior basal segment.  Left pulmonary artery and
branches are clear.

No abnormal mediastinal adenopathy.  No pericardial effusion.

Right internal jugular vein Port-A-Cath with its tip at the
cavoatrial junction.

Minimal basilar atelectasis.

Review of the MIP images confirms the above findings.
IMPRESSION: Study is positive for acute pulmonary thromboembolism in the right
lower lobe.

CT ABDOMEN AND PELVIS WITH CONTRAST
FINDINGS: Liver, gallbladder, kidneys, adrenal glands, spleen,
pancreas are within normal limits.

No free fluid.  No abnormal adenopathy.  No acute bony deformity.

Diverticulosis of the colon.  No convincing evidence of
inflammatory disease of the colon.  Normal appendix.
IMPRESSION: No acute intra-abdominal pathology.

## 2012-12-07 ENCOUNTER — Ambulatory Visit (HOSPITAL_BASED_OUTPATIENT_CLINIC_OR_DEPARTMENT_OTHER): Payer: 59

## 2012-12-07 ENCOUNTER — Encounter: Payer: Self-pay | Admitting: Oncology

## 2012-12-07 ENCOUNTER — Ambulatory Visit (HOSPITAL_BASED_OUTPATIENT_CLINIC_OR_DEPARTMENT_OTHER): Payer: 59 | Admitting: Oncology

## 2012-12-07 ENCOUNTER — Other Ambulatory Visit (HOSPITAL_BASED_OUTPATIENT_CLINIC_OR_DEPARTMENT_OTHER): Payer: 59

## 2012-12-07 ENCOUNTER — Telehealth: Payer: Self-pay | Admitting: *Deleted

## 2012-12-07 VITALS — BP 137/87 | HR 61 | Temp 97.8°F | Resp 20 | Ht 71.0 in | Wt 165.1 lb

## 2012-12-07 DIAGNOSIS — C711 Malignant neoplasm of frontal lobe: Secondary | ICD-10-CM

## 2012-12-07 DIAGNOSIS — I2699 Other pulmonary embolism without acute cor pulmonale: Secondary | ICD-10-CM

## 2012-12-07 DIAGNOSIS — Z5112 Encounter for antineoplastic immunotherapy: Secondary | ICD-10-CM

## 2012-12-07 DIAGNOSIS — I82409 Acute embolism and thrombosis of unspecified deep veins of unspecified lower extremity: Secondary | ICD-10-CM

## 2012-12-07 DIAGNOSIS — C719 Malignant neoplasm of brain, unspecified: Secondary | ICD-10-CM

## 2012-12-07 DIAGNOSIS — I10 Essential (primary) hypertension: Secondary | ICD-10-CM

## 2012-12-07 DIAGNOSIS — M25519 Pain in unspecified shoulder: Secondary | ICD-10-CM

## 2012-12-07 LAB — COMPREHENSIVE METABOLIC PANEL (CC13)
ALT: 46 U/L (ref 0–55)
AST: 35 U/L — ABNORMAL HIGH (ref 5–34)
CO2: 26 mEq/L (ref 22–29)
Calcium: 9 mg/dL (ref 8.4–10.4)
Chloride: 107 mEq/L (ref 98–107)
Creatinine: 0.9 mg/dL (ref 0.7–1.3)
Sodium: 141 mEq/L (ref 136–145)
Total Protein: 6.4 g/dL (ref 6.4–8.3)

## 2012-12-07 LAB — CBC WITH DIFFERENTIAL/PLATELET
Eosinophils Absolute: 0.2 10*3/uL (ref 0.0–0.5)
HGB: 12.9 g/dL — ABNORMAL LOW (ref 13.0–17.1)
LYMPH%: 18.7 % (ref 14.0–49.0)
MONO#: 0.3 10*3/uL (ref 0.1–0.9)
NEUT#: 2.3 10*3/uL (ref 1.5–6.5)
Platelets: 108 10*3/uL — ABNORMAL LOW (ref 140–400)
RBC: 3.81 10*6/uL — ABNORMAL LOW (ref 4.20–5.82)
WBC: 3.4 10*3/uL — ABNORMAL LOW (ref 4.0–10.3)

## 2012-12-07 LAB — PROTIME-INR: Protime: 50.4 Seconds — ABNORMAL HIGH (ref 10.6–13.4)

## 2012-12-07 MED ORDER — WARFARIN SODIUM 10 MG PO TABS: 10.0000 mg | ORAL_TABLET | Freq: Every day | ORAL | Status: DC

## 2012-12-07 MED ORDER — LISINOPRIL 5 MG PO TABS: 10.0000 mg | ORAL_TABLET | Freq: Every day | ORAL | Status: DC

## 2012-12-07 MED ORDER — HEPARIN SOD (PORK) LOCK FLUSH 100 UNIT/ML IV SOLN
500.0000 [IU] | Freq: Once | INTRAVENOUS | Status: AC | PRN
  Administered 2012-12-07: 500 [IU]
  Filled 2012-12-07: qty 5

## 2012-12-07 MED ORDER — SODIUM CHLORIDE 0.9 % IJ SOLN
10.0000 mL | INTRAMUSCULAR | Status: DC | PRN
  Administered 2012-12-07: 10 mL
  Filled 2012-12-07: qty 10

## 2012-12-07 MED ORDER — SODIUM CHLORIDE 0.9 % IV SOLN
10.0000 mg/kg | Freq: Once | INTRAVENOUS | Status: AC
  Administered 2012-12-07: 750 mg via INTRAVENOUS
  Filled 2012-12-07: qty 30

## 2012-12-07 MED ORDER — SODIUM CHLORIDE 0.9 % IV SOLN
Freq: Once | INTRAVENOUS | Status: AC
  Administered 2012-12-07: 11:00:00 via INTRAVENOUS

## 2012-12-07 NOTE — Patient Instructions (Signed)
[facility] Discharge Instructions for Patients Receiving Chemotherapy  Today you received the following chemotherapy agents: Avastin. To help prevent nausea and vomiting after your treatment, we encourage you to take your nausea medication, Zofran.  Take one 30 minutes before each dose of Temodar, and every eight hours as needed.   If you develop nausea and vomiting that is not controlled by your nausea medication, call the clinic. If it is after clinic hours your family physician or the after hours number for the clinic or go to the Emergency Department.   BELOW ARE SYMPTOMS THAT SHOULD BE REPORTED IMMEDIATELY:  *FEVER GREATER THAN 100.5 F  *CHILLS WITH OR WITHOUT FEVER  NAUSEA AND VOMITING THAT IS NOT CONTROLLED WITH YOUR NAUSEA MEDICATION  *UNUSUAL SHORTNESS OF BREATH  *UNUSUAL BRUISING OR BLEEDING  TENDERNESS IN MOUTH AND THROAT WITH OR WITHOUT PRESENCE OF ULCERS  *URINARY PROBLEMS  *BOWEL PROBLEMS  UNUSUAL RASH Items with * indicate a potential emergency and should be followed up as soon as possible.  Feel free to call the clinic you have any questions or concerns. The clinic phone number is [phone].   I have been informed and understand all the instructions given to me. I know to contact the clinic, my physician, or go to the Emergency Department if any problems should occur. I do not have any questions at this time, but understand that I may call the clinic during office hours   should I have any questions or need assistance in obtaining follow up care.

## 2012-12-07 NOTE — Telephone Encounter (Signed)
appts made and printed.the patient is aware that she will have tx on the scheduled days to have lab and to see LA. i emailed MB to add tx...td

## 2012-12-07 NOTE — Patient Instructions (Addendum)
Proceed with avastin today  Return in 2 weeks for follow up and avastin

## 2012-12-07 NOTE — Progress Notes (Signed)
OFFICE PROGRESS NOTE  Dr. 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 The tumor chemotherapy consisted of Temodar 75 mg per meter squared.  #2 patient was then started on Temodar given days 1 through 5 every 28 days with concomitant Avastin given every 2 weeks.  #3 patient also developed lower extremity DVT as well as as pulmonary embolism he was started on Coumadin to try to maintain an INR between 2 and 2.5.  CURRENT THERAPY: Patient is here for his scheduled Avastin Day   INTERVAL HISTORY: Roger Raymond 62 y.o. male returns for followup visit today for his treatment.  He spent January through April in [place]. He overall did well except for some hypertensive episodes. He had a little bit more fatigue with his Temodar during this  cycle, however, he is doing well and without questions/concerns. He denies fevers, chills, numbness, headaches, shortness of breath, GI upset, or any other concerns.    MEDICAL HISTORY: Past Medical History  Diagnosis Date  . 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 DVT (deep venous thrombosis) 07/08/2011    ALLERGIES:  has No Known Allergies.  MEDICATIONS:  Current Outpatient Prescriptions  Medication Sig Dispense Refill  . hydrochlorothiazide (HYDRODIURIL) 25 MG tablet       . levETIRAcetam (KEPPRA) 500 MG tablet Take 500 mg by mouth 2 (two) times daily.        [name] lidocaine-prilocaine (EMLA) cream Apply topically as needed. Apply to port 1-2 hours before procedure  30 g  2  . lisinopril (PRINIVIL,ZESTRIL) 5 MG tablet Take 2 tablets (10 mg total) by mouth daily.  90 tablet  6  . ondansetron  (ZOFRAN) 8 MG tablet Take 1 tablet (8 mg total) by mouth every 12 (twelve) hours as needed.  234 tablet  7  . temozolomide (TEMODAR) 140 MG capsule Take 2 capsules daily x 5 days then repeat every 28 days  10 capsule  10  . warfarin (COUMADIN) 10 MG tablet Take 1 tablet (10 mg total) by mouth daily.  60 tablet  7  . acetaminophen (TYLENOL) 325 MG tablet Take 650 mg by mouth every 6 (six) hours as needed.        . chlorproMAZINE (THORAZINE) 25 MG tablet Take 25 mg by mouth every 6 (six) hours as needed.        . diphenoxylate-atropine (LOMOTIL) 2.5-0.025 MG per tablet Take 1 tablet by mouth 4 (four) times daily as needed. 1-2 tabs prn       . sodium chloride (OCEAN) 0.65 % nasal spray Place 1 spray into the nose as needed.      . warfarin (COUMADIN) 5 MG tablet Take 1 tablet (5 mg total) by mouth daily. Or as directed by physician  64 tablet  0   No current facility-administered medications for this visit.   Facility-Administered Medications Ordered in Other Visits  Medication Dose Route Frequency Provider Last Rate Last Dose  . bevacizumab (AVASTIN) 750 mg in sodium chloride 0.9 % 100 mL chemo infusion  10 mg/kg (Treatment Plan Actual) Intravenous Once Deatra Robinson, MD      . heparin lock flush 100 unit/mL  500 Units Intracatheter Once PRN Deatra Robinson, MD      . sodium chloride 0.9 % injection 10 mL  10 mL Intracatheter PRN Deatra Robinson, MD        SURGICAL HISTORY:  Past Surgical History  Procedure Laterality Date  . Vasectomy      REVIEW OF SYSTEMS:  General: fatigue (+), night sweats (-), fever (-), pain (-) Lymph: palpable nodes (-) HEENT: vision changes (-), mucositis (-), gum bleeding (-), epistaxis (-) Cardiovascular: chest pain (-), palpitations (-) Pulmonary: shortness of breath (-), dyspnea on exertion (-), cough (-), hemoptysis (-) GI:  Early satiety (-), melena (-), dysphagia (-), nausea/vomiting (-), diarrhea (-) GU: dysuria (-), hematuria (-), incontinence  (-) Musculoskeletal: joint swelling (-), joint pain (-), back pain (-) Neuro: weakness (-), numbness (-), headache (-), confusion (-) Skin: Rash (-), lesions (-), dryness (-) Psych: depression (-), suicidal/homicidal ideation (-), feeling of hopelessness (-)  PHYSICAL EXAMINATION:  BP 137/87  Pulse 61  Temp(Src) 97.8 F (36.6 C) (Oral)  Resp 20  Ht 5\' 11"  (1.803 m)  Wt 165 lb 1.6 oz (74.889 kg)  BMI 23.04 kg/m2 General: Patient is a well appearing male in no acute distress HEENT: PERRLA, sclerae anicteric no conjunctival pallor, MMM Neck: supple, no palpable adenopathy Lungs: clear to auscultation bilaterally, no wheezes, rhonchi, or rales Cardiovascular: regular rate rhythm, S1, S2, no murmurs, rubs or gallops Abdomen: Soft, non-tender, non-distended, normoactive bowel sounds, no HSM Extremities: warm and well perfused, no clubbing, cyanosis, or edema Skin: No rashes or lesions Neuro: CN II-XII intact.  Normal gait, 5/5 strength in all extremities.   ECOG PERFORMANCE STATUS: 0 - Asymptomatic    LABORATORY DATA: Lab Results  Component Value Date   WBC 3.4* 12/07/2012   HGB 12.9* 12/07/2012   HCT 37.5* 12/07/2012   MCV 98.4* 12/07/2012   PLT 108* 12/07/2012      Chemistry      Component Value Date/Time   NA 138 08/17/2012 1304   NA 138 03/02/2012 1100   K 3.9 08/17/2012 1304   K 4.5 03/02/2012 1100   CL 105 08/17/2012 1304   CL 102 03/02/2012 1100   CO2 26 08/17/2012 1304   CO2 23 03/02/2012 1100   BUN 15.0 08/17/2012 1304   BUN 25* 03/02/2012 1100   CREATININE 0.9 08/17/2012 1304   CREATININE 1.05 03/02/2012 1100      Component Value Date/Time   CALCIUM 8.7 08/17/2012 1304   CALCIUM 9.5 03/02/2012 1100   ALKPHOS 103 08/17/2012 1304   ALKPHOS 130* 03/02/2012 1100   AST 29 08/17/2012 1304   AST 39* 03/02/2012 1100   ALT 36 08/17/2012 1304   ALT 65* 03/02/2012 1100   BILITOT 0.56 08/17/2012 1304   BILITOT 0.4 03/02/2012 1100       RADIOGRAPHIC STUDIES:  No results  found.  ASSESSMENT: 62 year old gentleman with:  1.  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 Overall he is doing well and tolerating his therapy quite well. He also is getting  Avastin and tolerating it very well.  2.  For his pulmonary embolus he is on Coumadin and he is very therapeutic with his INR. We are checking his INR is at [facility] with the results being faxed to [place] here at cancer center.  #3 shoulder pain I have recommended that he try some Ibuprofen 200 mg twice a day for the next 4-5 days  to see if he gets any relief if not then he certainly can go see his orthopedic surgeon.   #4 Hypertension  PLAN:  #1 patient will proceed with scheduled Avastin today.  #2 we will continue lisinopril daily.  #3 he will take Temodar days 1 through 5 starting today.  All questions were answered. The patient knows to call the clinic with any problems, questions or concerns. We can certainly see the patient much sooner if necessary.  I spent >25 minutes counseling the patient face to face. The total time spent in the appointment was 30 minutes.  Marcy Panning, MD Medical/Oncology [facility] [phone] (beeper) [phone] (Office)  12/07/2012, 11:31 AM

## 2012-12-15 ENCOUNTER — Telehealth: Payer: Self-pay | Admitting: *Deleted

## 2012-12-15 NOTE — Telephone Encounter (Signed)
Lm informing the pt that i was returning his call...td

## 2012-12-21 ENCOUNTER — Encounter: Payer: Self-pay | Admitting: Adult Health

## 2012-12-21 ENCOUNTER — Telehealth: Payer: Self-pay | Admitting: *Deleted

## 2012-12-21 ENCOUNTER — Ambulatory Visit (HOSPITAL_BASED_OUTPATIENT_CLINIC_OR_DEPARTMENT_OTHER): Payer: 59 | Admitting: Adult Health

## 2012-12-21 ENCOUNTER — Other Ambulatory Visit (HOSPITAL_BASED_OUTPATIENT_CLINIC_OR_DEPARTMENT_OTHER): Payer: 59 | Admitting: Lab

## 2012-12-21 ENCOUNTER — Ambulatory Visit (HOSPITAL_BASED_OUTPATIENT_CLINIC_OR_DEPARTMENT_OTHER): Payer: 59

## 2012-12-21 ENCOUNTER — Other Ambulatory Visit: Payer: Self-pay | Admitting: Medical Oncology

## 2012-12-21 VITALS — BP 127/85 | HR 66 | Temp 98.4°F | Resp 20 | Ht 71.0 in | Wt 164.2 lb

## 2012-12-21 DIAGNOSIS — I2699 Other pulmonary embolism without acute cor pulmonale: Secondary | ICD-10-CM

## 2012-12-21 DIAGNOSIS — M25519 Pain in unspecified shoulder: Secondary | ICD-10-CM

## 2012-12-21 DIAGNOSIS — I10 Essential (primary) hypertension: Secondary | ICD-10-CM

## 2012-12-21 DIAGNOSIS — C719 Malignant neoplasm of brain, unspecified: Secondary | ICD-10-CM

## 2012-12-21 DIAGNOSIS — C711 Malignant neoplasm of frontal lobe: Secondary | ICD-10-CM

## 2012-12-21 DIAGNOSIS — Z5112 Encounter for antineoplastic immunotherapy: Secondary | ICD-10-CM

## 2012-12-21 LAB — CBC WITH DIFFERENTIAL/PLATELET
BASO%: 0.6 % (ref 0.0–2.0)
LYMPH%: 22.3 % (ref 14.0–49.0)
MCHC: 34.1 g/dL (ref 32.0–36.0)
MCV: 101 fL — ABNORMAL HIGH (ref 79.3–98.0)
MONO%: 9.8 % (ref 0.0–14.0)
Platelets: 163 10*3/uL (ref 140–400)
RBC: 4.06 10*6/uL — ABNORMAL LOW (ref 4.20–5.82)
WBC: 3.4 10*3/uL — ABNORMAL LOW (ref 4.0–10.3)

## 2012-12-21 LAB — PROTIME-INR: INR: 2.3 (ref 2.00–3.50)

## 2012-12-21 LAB — BASIC METABOLIC PANEL (CC13)
Calcium: 8.8 mg/dL (ref 8.4–10.4)
Sodium: 140 mEq/L (ref 136–145)

## 2012-12-21 LAB — UA PROTEIN, DIPSTICK - CHCC: Protein, ur: <30 mg/dL

## 2012-12-21 MED ORDER — SODIUM CHLORIDE 0.9 % IJ SOLN
10.0000 mL | INTRAMUSCULAR | Status: DC | PRN
  Filled 2012-12-21: qty 10

## 2012-12-21 MED ORDER — SODIUM CHLORIDE 0.9 % IV SOLN
Freq: Once | INTRAVENOUS | Status: AC
  Administered 2012-12-21: 11:00:00 via INTRAVENOUS

## 2012-12-21 MED ORDER — SODIUM CHLORIDE 0.9 % IV SOLN
10.0000 mg/kg | Freq: Once | INTRAVENOUS | Status: AC
  Administered 2012-12-21: 750 mg via INTRAVENOUS
  Filled 2012-12-21: qty 30

## 2012-12-21 NOTE — Progress Notes (Signed)
OFFICE PROGRESS NOTE  Dr. 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 The tumor chemotherapy consisted of Temodar 75 mg per meter squared.  #2 patient was then started on Temodar given days 1 through 5 every 28 days with concomitant Avastin given every 2 weeks.  #3 patient also developed lower extremity DVT as well as as pulmonary embolism he was started on Coumadin to try to maintain an INR between 2 and 2.5.  CURRENT THERAPY: Patient is here for his scheduled Avastin  INTERVAL HISTORY: Roger Raymond 62 y.o. male returns for followup visit today for his treatment. He took Temodar from 12/07/12-12/12/12.  He is fatigued and has abdominal discomfort up to several days after completion.  He has recovered from the side effects today.  He will restart his Temodar on 01/04/13.  He has f/u with Dr. Sherwood Gambler on 6/6 with MRI.  He is taking 10 mg of Coumadin daily and his INR today is 2.3.  He is doing well and denies swelling, shortness of breath, weakness, numbness, headaches, oliguria, dysuria, easy bruising/bleeding, or any further concerns.   MEDICAL HISTORY: Past Medical History  Diagnosis Date  . 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 DVT (deep venous thrombosis) 07/08/2011    ALLERGIES:  has No Known Allergies.  MEDICATIONS:  Current Outpatient Prescriptions  Medication Sig Dispense Refill  . acetaminophen (TYLENOL) 325 MG tablet Take 650 mg by mouth every 6 (six) hours as needed.        . chlorproMAZINE (THORAZINE) 25 MG tablet Take 25 mg by mouth every 6 (six) hours as needed.        . diphenoxylate-atropine (LOMOTIL)  2.5-0.025 MG per tablet Take 1 tablet by mouth 4 (four) times daily as needed. 1-2 tabs prn       . hydrochlorothiazide (HYDRODIURIL) 25 MG tablet       . levETIRAcetam (KEPPRA) 500 MG tablet Take 500 mg by mouth 2 (two) times daily.        [name] lidocaine-prilocaine (EMLA) cream Apply topically as needed. Apply to port 1-2 hours before procedure  30 g  2  . lisinopril (PRINIVIL,ZESTRIL) 5 MG tablet Take 2 tablets (10 mg total) by mouth daily.  90 tablet  6  . ondansetron (ZOFRAN) 8 MG tablet Take 1 tablet (8 mg total) by mouth every 12 (twelve) hours as needed.  234 tablet  7  . sodium chloride (OCEAN) 0.65 % nasal spray Place 1 spray into the nose as needed.      . temozolomide (TEMODAR) 140 MG capsule Take 2 capsules daily x 5 days then repeat every 28 days  10 capsule  10  . warfarin (COUMADIN) 10 MG tablet Take 1 tablet (10 mg total) by mouth daily.  60 tablet  7  . warfarin (COUMADIN) 5 MG tablet Take 1 tablet (5 mg total) by mouth daily. Or as directed by physician  64 tablet  0   No current facility-administered medications for this visit.    SURGICAL HISTORY:  Past Surgical History  Procedure Laterality Date  . Vasectomy      REVIEW OF SYSTEMS:  General: fatigue (+), night  sweats (-), fever (-), pain (-) Lymph: palpable nodes (-) HEENT: vision changes (-), mucositis (-), gum bleeding (-), epistaxis (-) Cardiovascular: chest pain (-), palpitations (-) Pulmonary: shortness of breath (-), dyspnea on exertion (-), cough (-), hemoptysis (-) GI:  Early satiety (-), melena (-), dysphagia (-), nausea/vomiting (-), diarrhea (-) GU: dysuria (-), hematuria (-), incontinence (-) Musculoskeletal: joint swelling (-), joint pain (-), back pain (-) Neuro: weakness (-), numbness (-), headache (-), confusion (-) Skin: Rash (-), lesions (-), dryness (-) Psych: depression (-), suicidal/homicidal ideation (-), feeling of hopelessness (-)  PHYSICAL EXAMINATION:  BP 127/85  Pulse 66  Temp(Src) 98.4  F (36.9 C) (Oral)  Resp 20  Ht 5\' 11"  (1.803 m)  Wt 164 lb 3.2 oz (74.481 kg)  BMI 22.91 kg/m2 General: Patient is a well appearing male in no acute distress HEENT: PERRLA, sclerae anicteric no conjunctival pallor, MMM Neck: supple, no palpable adenopathy Lungs: clear to auscultation bilaterally, no wheezes, rhonchi, or rales Cardiovascular: regular rate rhythm, S1, S2, no murmurs, rubs or gallops Abdomen: Soft, non-tender, non-distended, normoactive bowel sounds, no HSM Extremities: warm and well perfused, no clubbing, cyanosis, or edema Skin: No rashes or lesions Neuro: CN II-XII intact.  Normal gait, 5/5 strength in all extremities.   ECOG PERFORMANCE STATUS: 0 - Asymptomatic    LABORATORY DATA: Lab Results  Component Value Date   WBC 3.4* 12/21/2012   HGB 14.0 12/21/2012   HCT 41.0 12/21/2012   MCV 101.0* 12/21/2012   PLT 163 12/21/2012      Chemistry      Component Value Date/Time   NA 141 12/07/2012 0918   NA 138 03/02/2012 1100   K 4.1 12/07/2012 0918   K 4.5 03/02/2012 1100   CL 107 12/07/2012 0918   CL 102 03/02/2012 1100   CO2 26 12/07/2012 0918   CO2 23 03/02/2012 1100   BUN 14.6 12/07/2012 0918   BUN 25* 03/02/2012 1100   CREATININE 0.9 12/07/2012 0918   CREATININE 1.05 03/02/2012 1100      Component Value Date/Time   CALCIUM 9.0 12/07/2012 0918   CALCIUM 9.5 03/02/2012 1100   ALKPHOS 92 12/07/2012 0918   ALKPHOS 130* 03/02/2012 1100   AST 35* 12/07/2012 0918   AST 39* 03/02/2012 1100   ALT 46 12/07/2012 0918   ALT 65* 03/02/2012 1100   BILITOT 0.44 12/07/2012 0918   BILITOT 0.4 03/02/2012 1100       RADIOGRAPHIC STUDIES:  No results found.  ASSESSMENT: 62 year old gentleman with:  1.  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 Overall he is doing well and tolerating his therapy quite well. He also is getting  Avastin and tolerating it very well.  2.  For his pulmonary embolus he is on Coumadin and he is very  therapeutic with his INR.   #3 shoulder pain I have recommended that he try some Ibuprofen 200 mg twice a day for the next 4-5 days to see if he gets any relief if not then he certainly can go see his orthopedic surgeon.  #4 Hypertension--controlled on Lisinopril and HCTZ  PLAN:  #1 Patient will proceed with scheduled Avastin today.  I reviewed his lab work with him in detail.    #2 we will continue anti-hypertensive regimen, he is normotensive today.    #3He completed Temodar from 12/07/12 through 12/12/12.  He will restart temodar on 01/04/13.    #4 He will continue  Coumadin 10mg  daily.  His inr is 2.3 today.   #5 He will return in 2 weeks for his every 2 week Avastin and f/u with Dr. Sherwood Gambler on 6/6.  All questions were answered. The patient knows to call the clinic with any problems, questions or concerns. We can certainly see the patient much sooner if necessary.  I spent >25 minutes counseling the patient face to face. The total time spent in the appointment was 30 minutes.   Suzan Garibaldi Sheppard [place], [place] Phone: [phone] 12/21/2012, 9:55 AM

## 2012-12-21 NOTE — Telephone Encounter (Signed)
Per staff phone call and POF I have schedueld appts.  JMW  

## 2012-12-21 NOTE — Telephone Encounter (Signed)
appts made and printed...td 

## 2012-12-21 NOTE — Patient Instructions (Addendum)
\[id number] injection What is this medicine? BEVACIZUMAB (be va SIZ yoo mab) is a chemotherapy drug. It targets a protein found in many cancer cell types, and halts cancer growth. This drug treats many cancers including non-small cell lung cancer, and colon or rectal cancer. It is usually given with other chemotherapy drugs. This medicine may be used for other purposes; ask your health care provider or pharmacist if you have questions. What should I tell my health care provider before I take this medicine? They need to know if you have any of these conditions: -blood clots -heart disease, including heart failure, heart attack, or chest pain (angina) -high blood pressure -infection (especially a virus infection such as chickenpox, cold sores, or herpes) -kidney disease -lung disease -prior chemotherapy with doxorubicin, daunorubicin, epirubicin, or other anthracycline type chemotherapy agents -recent or ongoing radiation therapy -recent surgery -stroke -an unusual or allergic reaction to bevacizumab, hamster proteins, mouse proteins, other medicines, foods, dyes, or preservatives -pregnant or trying to get pregnant -breast-feeding How should I use this medicine? This medicine is for infusion into a vein. It is given by a health care professional in a hospital or clinic setting. Talk to your pediatrician regarding the use of this medicine in children. Special care may be needed. Overdosage: If you think you have taken too much of this medicine contact a poison control center or emergency room at once. NOTE: This medicine is only for you. Do not share this medicine with others. What if I miss a dose? It is important not to miss your dose. Call your doctor or health care professional if you are unable to keep an appointment. What may interact with this medicine? Interactions are not expected. This list may not describe all possible interactions. Give your health  care provider a list of all the medicines, herbs, non-prescription drugs, or dietary supplements you use. Also tell them if you smoke, drink alcohol, or use illegal drugs. Some items may interact with your medicine. What should I watch for while using this medicine? Your condition will be monitored carefully while you are receiving this medicine. You will need important blood work and urine testing done while you are taking this medicine. During your treatment, let your health care professional know if you have any unusual symptoms, such as difficulty breathing. This medicine may rarely cause 'gastrointestinal perforation' (holes in the stomach, intestines or colon), a serious side effect requiring surgery to repair. This medicine should be started at least 28 days following major surgery and the site of the surgery should be totally healed. Check with your doctor before scheduling dental work or surgery while you are receiving this treatment. Talk to your doctor if you have recently had surgery or if you have a wound that has not healed. Do not become pregnant while taking this medicine. Women should inform their doctor if they wish to become pregnant or think they might be pregnant. There is a potential for serious side effects to an unborn child. Talk to your health care professional or pharmacist for more information. Do not breast-feed an infant while taking this medicine. This medicine has caused ovarian failure in some women. This medicine may interfere with the ability to have a child. You should talk to your doctor or health care professional if you are concerned about your fertility. What side effects may I notice from receiving this medicine? Side effects that you should report to your doctor or health care professional as soon as possible: -allergic reactions like skin  rash, itching or hives, swelling of the face, lips, or tongue -signs of infection - fever or chills, cough, sore throat, pain  or trouble passing urine -signs of decreased platelets or bleeding - bruising, pinpoint red spots on the skin, black, tarry stools, nosebleeds, blood in the urine -breathing problems -changes in vision -chest pain -confusion -jaw pain, especially after dental work -mouth sores -seizures -severe abdominal pain -severe headache -sudden numbness or weakness of the face, arm or leg -swelling of legs or ankles -symptoms of a stroke: change in mental awareness, inability to talk or move one side of the body (especially in patients with lung cancer) -trouble passing urine or change in the amount of urine -trouble speaking or understanding -trouble walking, dizziness, loss of balance or coordination Side effects that usually do not require medical attention (report to your doctor or health care professional if they continue or are bothersome): -constipation -diarrhea -dry skin -headache -loss of appetite -nausea, vomiting This list may not describe all possible side effects. Call your doctor for medical advice about side effects. You may report side effects to FDA at 1-800-FDA-1088. Where should I keep my medicine? This drug is given in a hospital or clinic and will not be stored at home. NOTE: This sheet is a summary. It may not cover all possible information. If you have questions about this medicine, talk to your doctor, pharmacist, or health care provider.  2013, [employer]. (06/22/2010 4:25:37 PM)

## 2012-12-21 NOTE — Patient Instructions (Addendum)
Doing well. Proceed with Avastin.  Please call us if you have any questions or concerns.    

## 2013-01-04 ENCOUNTER — Ambulatory Visit (HOSPITAL_BASED_OUTPATIENT_CLINIC_OR_DEPARTMENT_OTHER): Payer: 59 | Admitting: Adult Health

## 2013-01-04 ENCOUNTER — Encounter: Payer: Self-pay | Admitting: Adult Health

## 2013-01-04 ENCOUNTER — Ambulatory Visit (HOSPITAL_BASED_OUTPATIENT_CLINIC_OR_DEPARTMENT_OTHER): Payer: 59

## 2013-01-04 ENCOUNTER — Other Ambulatory Visit (HOSPITAL_BASED_OUTPATIENT_CLINIC_OR_DEPARTMENT_OTHER): Payer: 59

## 2013-01-04 ENCOUNTER — Telehealth: Payer: Self-pay | Admitting: Oncology

## 2013-01-04 VITALS — BP 129/89 | HR 60 | Temp 97.6°F | Resp 20 | Ht 71.0 in | Wt 161.9 lb

## 2013-01-04 DIAGNOSIS — I82409 Acute embolism and thrombosis of unspecified deep veins of unspecified lower extremity: Secondary | ICD-10-CM

## 2013-01-04 DIAGNOSIS — C711 Malignant neoplasm of frontal lobe: Secondary | ICD-10-CM

## 2013-01-04 DIAGNOSIS — Z5112 Encounter for antineoplastic immunotherapy: Secondary | ICD-10-CM

## 2013-01-04 DIAGNOSIS — C719 Malignant neoplasm of brain, unspecified: Secondary | ICD-10-CM

## 2013-01-04 DIAGNOSIS — I10 Essential (primary) hypertension: Secondary | ICD-10-CM

## 2013-01-04 DIAGNOSIS — I2699 Other pulmonary embolism without acute cor pulmonale: Secondary | ICD-10-CM

## 2013-01-04 LAB — PROTIME-INR: Protime: 13.2 Seconds (ref 10.6–13.4)

## 2013-01-04 LAB — CBC WITH DIFFERENTIAL/PLATELET
Basophils Absolute: 0 10*3/uL (ref 0.0–0.1)
Eosinophils Absolute: 0.1 10*3/uL (ref 0.0–0.5)
HCT: 41.3 % (ref 38.4–49.9)
LYMPH%: 22.4 % (ref 14.0–49.0)
MCV: 101.2 fL — ABNORMAL HIGH (ref 79.3–98.0)
MONO#: 0.3 10*3/uL (ref 0.1–0.9)
MONO%: 8.6 % (ref 0.0–14.0)
NEUT#: 2.3 10*3/uL (ref 1.5–6.5)
NEUT%: 64.8 % (ref 39.0–75.0)
Platelets: 145 10*3/uL (ref 140–400)
RBC: 4.08 10*6/uL — ABNORMAL LOW (ref 4.20–5.82)
WBC: 3.6 10*3/uL — ABNORMAL LOW (ref 4.0–10.3)
nRBC: 0 % (ref 0–0)

## 2013-01-04 LAB — COMPREHENSIVE METABOLIC PANEL (CC13)
ALT: 50 U/L (ref 0–55)
BUN: 18.2 mg/dL (ref 7.0–26.0)
CO2: 27 mEq/L (ref 22–29)
Calcium: 9 mg/dL (ref 8.4–10.4)
Chloride: 105 mEq/L (ref 98–107)
Creatinine: 0.9 mg/dL (ref 0.7–1.3)
Glucose: 90 mg/dl (ref 70–99)
Total Bilirubin: 0.8 mg/dL (ref 0.20–1.20)

## 2013-01-04 LAB — UA PROTEIN, DIPSTICK - CHCC: Protein, ur: 30 mg/dL

## 2013-01-04 MED ORDER — SODIUM CHLORIDE 0.9 % IJ SOLN
10.0000 mL | INTRAMUSCULAR | Status: DC | PRN
  Administered 2013-01-04: 10 mL
  Filled 2013-01-04: qty 10

## 2013-01-04 MED ORDER — SODIUM CHLORIDE 0.9 % IV SOLN
10.0000 mg/kg | Freq: Once | INTRAVENOUS | Status: AC
  Administered 2013-01-04: 750 mg via INTRAVENOUS
  Filled 2013-01-04: qty 30

## 2013-01-04 MED ORDER — HEPARIN SOD (PORK) LOCK FLUSH 100 UNIT/ML IV SOLN
500.0000 [IU] | Freq: Once | INTRAVENOUS | Status: AC | PRN
  Administered 2013-01-04: 500 [IU]
  Filled 2013-01-04: qty 5

## 2013-01-04 MED ORDER — SODIUM CHLORIDE 0.9 % IV SOLN
Freq: Once | INTRAVENOUS | Status: AC
  Administered 2013-01-04: 11:00:00 via INTRAVENOUS

## 2013-01-04 NOTE — Progress Notes (Signed)
OFFICE PROGRESS NOTE  Dr. 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 The tumor chemotherapy consisted of Temodar 75 mg per meter squared.  #2 patient was then started on Temodar given days 1 through 5 every 28 days with concomitant Avastin given every 2 weeks.  #3 patient also developed lower extremity DVT as well as as pulmonary embolism he was started on Coumadin to try to maintain an INR between 2 and 2.5.  CURRENT THERAPY: Patient is here for his scheduled Avastin  INTERVAL HISTORY: Roger Raymond 62 y.o. male returns for followup visit today for his treatment. He will start Temodar tonight.    He has f/u with Dr. Sherwood Gambler on 6/6 and had his MRI about a week ago on 5/23.  He is taking 10 mg of Coumadin daily and his INR today is 1.1.  He has increased his green leafy vegetables lately.  He denies easy bruising/bleeding.  A 10 point ROS is neg.    MEDICAL HISTORY: Past Medical History  Diagnosis Date  . 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 DVT (deep venous thrombosis) 07/08/2011    ALLERGIES:  has No Known Allergies.  MEDICATIONS:  Current Outpatient Prescriptions  Medication Sig Dispense Refill  . acetaminophen (TYLENOL) 325 MG tablet Take 650 mg by mouth every 6 (six) hours as needed.        . chlorproMAZINE (THORAZINE) 25 MG tablet Take 25 mg by mouth every 6 (six) hours as needed.        . diphenoxylate-atropine (LOMOTIL) 2.5-0.025 MG per tablet Take 1 tablet by mouth 4 (four) times daily as needed. 1-2 tabs prn       . hydrochlorothiazide (HYDRODIURIL) 25 MG tablet       . levETIRAcetam (KEPPRA) 500 MG tablet  Take 500 mg by mouth 2 (two) times daily.        [name] lidocaine-prilocaine (EMLA) cream Apply topically as needed. Apply to port 1-2 hours before procedure  30 g  2  . lisinopril (PRINIVIL,ZESTRIL) 5 MG tablet Take 2 tablets (10 mg total) by mouth daily.  90 tablet  6  . ondansetron (ZOFRAN) 8 MG tablet Take 1 tablet (8 mg total) by mouth every 12 (twelve) hours as needed.  234 tablet  7  . sodium chloride (OCEAN) 0.65 % nasal spray Place 1 spray into the nose as needed.      . temozolomide (TEMODAR) 140 MG capsule Take 2 capsules daily x 5 days then repeat every 28 days  10 capsule  10  . warfarin (COUMADIN) 10 MG tablet Take 1 tablet (10 mg total) by mouth daily.  60 tablet  7  . warfarin (COUMADIN) 5 MG tablet Take 1 tablet (5 mg total) by mouth daily. Or as directed by physician  64 tablet  0   No current facility-administered medications for this visit.    SURGICAL HISTORY:  Past Surgical History  Procedure Laterality Date  . Vasectomy      REVIEW OF SYSTEMS:  General: fatigue (-), night sweats (-), fever (-), pain (-) Lymph: palpable nodes (-) HEENT: vision changes (-), mucositis (-), gum bleeding (-), epistaxis (-)  Cardiovascular: chest pain (-), palpitations (-) Pulmonary: shortness of breath (-), dyspnea on exertion (-), cough (-), hemoptysis (-) GI:  Early satiety (-), melena (-), dysphagia (-), nausea/vomiting (-), diarrhea (-) GU: dysuria (-), hematuria (-), incontinence (-) Musculoskeletal: joint swelling (-), joint pain (-), back pain (-) Neuro: weakness (-), numbness (-), headache (-), confusion (-) Skin: Rash (-), lesions (-), dryness (-) Psych: depression (-), suicidal/homicidal ideation (-), feeling of hopelessness (-)  PHYSICAL EXAMINATION:  BP 129/89  Pulse 60  Temp(Src) 97.6 F (36.4 C) (Oral)  Resp 20  Ht 5\' 11"  (1.803 m)  Wt 161 lb 14.4 oz (73.437 kg)  BMI 22.59 kg/m2 General: Patient is a well appearing male in no acute distress HEENT: PERRLA, sclerae  anicteric no conjunctival pallor, MMM Neck: supple, no palpable adenopathy Lungs: clear to auscultation bilaterally, no wheezes, rhonchi, or rales Cardiovascular: regular rate rhythm, S1, S2, no murmurs, rubs or gallops Abdomen: Soft, non-tender, non-distended, normoactive bowel sounds, no HSM Extremities: warm and well perfused, no clubbing, cyanosis, or edema Skin: No rashes or lesions Neuro: CN II-XII intact.  Normal gait, 5/5 strength in all extremities.   ECOG PERFORMANCE STATUS: 0 - Asymptomatic    LABORATORY DATA: Lab Results  Component Value Date   WBC 3.6* 01/04/2013   HGB 14.0 01/04/2013   HCT 41.3 01/04/2013   MCV 101.2* 01/04/2013   PLT 145 01/04/2013      Chemistry      Component Value Date/Time   NA 140 12/21/2012 0913   NA 138 03/02/2012 1100   K 4.4 12/21/2012 0913   K 4.5 03/02/2012 1100   CL 105 12/21/2012 0913   CL 102 03/02/2012 1100   CO2 27 12/21/2012 0913   CO2 23 03/02/2012 1100   BUN 14.0 12/21/2012 0913   BUN 25* 03/02/2012 1100   CREATININE 0.9 12/21/2012 0913   CREATININE 1.05 03/02/2012 1100      Component Value Date/Time   CALCIUM 8.8 12/21/2012 0913   CALCIUM 9.5 03/02/2012 1100   ALKPHOS 92 12/07/2012 0918   ALKPHOS 130* 03/02/2012 1100   AST 35* 12/07/2012 0918   AST 39* 03/02/2012 1100   ALT 46 12/07/2012 0918   ALT 65* 03/02/2012 1100   BILITOT 0.44 12/07/2012 0918   BILITOT 0.4 03/02/2012 1100       RADIOGRAPHIC STUDIES:  No results found.  ASSESSMENT: 62 year old gentleman with:  1.  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 Overall he is doing well and tolerating his therapy quite well. He also is getting  Avastin and tolerating it very well.  2.  For his pulmonary embolus he is on Coumadin and he is very therapeutic with his INR.   #3 shoulder pain I have recommended that he try some Ibuprofen 200 mg twice a day for the next 4-5 days to see if he gets any relief if not then he certainly can go  see his orthopedic surgeon.  #4 Hypertension--controlled on Lisinopril and HCTZ  PLAN:  #1 Patient will proceed with scheduled Avastin today.  I reviewed his lab work with him in detail.    #2 we will continue anti-hypertensive regimen, he is normotensive today.    #3He will restart temodar on 01/04/13.    #4 He will continue Coumadin 10mg  daily.  His inr is 1.1 today. He will decrease his green vegetable intake and we will re-check on Friday.  I gave him a handout in  his AVS about Vitamin K foods, and encouraged him to eat them on a more consistent basis.    #5 He will return in 2 weeks for his every 2 week Avastin and f/u with Dr. Sherwood Gambler on 6/6.  All questions were answered. The patient knows to call the clinic with any problems, questions or concerns. We can certainly see the patient much sooner if necessary.  I spent >25 minutes counseling the patient face to face. The total time spent in the appointment was 30 minutes.   Suzan Garibaldi Sheppard Coil, [place] Phone: [phone] 01/04/2013, 10:11 AM

## 2013-01-04 NOTE — Patient Instructions (Addendum)
[facility] Discharge Instructions for Patients Receiving Chemotherapy  Today you received the following chemotherapy agents Avastin  To help prevent nausea and vomiting after your treatment, we encourage you to take your nausea medication as prescribed.   If you develop nausea and vomiting that is not controlled by your nausea medication, call the clinic. If it is after clinic hours your family physician or the after hours number for the clinic or go to the Emergency Department.   BELOW ARE SYMPTOMS THAT SHOULD BE REPORTED IMMEDIATELY:  *FEVER GREATER THAN 100.5 F  *CHILLS WITH OR WITHOUT FEVER  NAUSEA AND VOMITING THAT IS NOT CONTROLLED WITH YOUR NAUSEA MEDICATION  *UNUSUAL SHORTNESS OF BREATH  *UNUSUAL BRUISING OR BLEEDING  TENDERNESS IN MOUTH AND THROAT WITH OR WITHOUT PRESENCE OF ULCERS  *URINARY PROBLEMS  *BOWEL PROBLEMS  UNUSUAL RASH Items with * indicate a potential emergency and should be followed up as soon as possible.  Feel free to call the clinic you have any questions or concerns. The clinic phone number is [phone].   I have been informed and understand all the instructions given to me. I know to contact the clinic, my physician, or go to the Emergency Department if any problems should occur. I do not have any questions at this time, but understand that I may call the clinic during office hours   should I have any questions or need assistance in obtaining follow up care.    __________________________________________  _____________  __________ Signature of Patient or Authorized Representative            Date                   Time    __________________________________________ Nurse's Signature    

## 2013-01-04 NOTE — Patient Instructions (Signed)
Vitamin K and Warfarin  Warfarin is a drug that helps thin your blood. If you take warfarin, you will need to follow a diet that has a consistent amount of vitamin K-containing foods. Sudden changes in the amount of vitamin K that you eat can cause the medicine to not work as well as it should. You do not need to avoid vitamin K-containing foods.  FOODS HIGH IN VITAMIN K  · Broccoli, fresh or frozen, cooked, 1 cup  · Greens, fresh or frozen, cooked (beet, collard, mustard, turnip), ½ cup  · Kale, fresh or frozen, cooked, ½ cup  · Parsley, raw, 10 sprigs  · Spinach, frozen or canned, cooked, ½ cup  FOODS MODERATELY HIGH IN VITAMIN K  · Bok choy, cooked, 1 cup  · Broccoli, raw, 1 cup  · Brussels sprouts, fresh or frozen, cooked, ½ cup  · Cabbage, cooked, 1 cup  · Endive, raw, 1 cup  · Green leaf lettuce, raw, 1 cup  · Green scallions, raw, ¼ cup  · Okra, frozen, cooked, 1 cup  · Romaine lettuce, raw, 1 cup  · Sauerkraut, canned, 1 cup  · Spinach, raw, 1 cup  KEEPING YOUR INTAKE CONSISTENT  · Note the foods that are high in vitamin K listed above. How many times per week do you eat these foods?  · For example, you might eat cooked broccoli 1 time a week and a leafy green salad 3 times a week. In that case, you should have 1 high vitamin K food each week and 3 moderately high foods each week.  · Remember, you do not need to eat the same foods each week. You do need to keep your vitamin K intake levels the same. Notify your caregiver before changing your diet.  MORE TIPS  · Take your warfarin as instructed.  · It is okay to take a multivitamin that contains vitamin K. Just be sure to take it every day.  · Discuss any supplements or whole food supplements with your pharmacist.  Document Released: 05/19/2009 Document Revised: 01/21/2012 Document Reviewed: 05/19/2009  [employer]® Patient Information ©2014 [employer].

## 2013-01-05 ENCOUNTER — Other Ambulatory Visit: Payer: Self-pay | Admitting: *Deleted

## 2013-01-05 DIAGNOSIS — I10 Essential (primary) hypertension: Secondary | ICD-10-CM

## 2013-01-05 MED ORDER — LISINOPRIL 10 MG PO TABS: 10.0000 mg | ORAL_TABLET | Freq: Every day | ORAL | Status: DC

## 2013-01-08 ENCOUNTER — Telehealth: Payer: Self-pay | Admitting: Medical Oncology

## 2013-01-08 ENCOUNTER — Other Ambulatory Visit (HOSPITAL_BASED_OUTPATIENT_CLINIC_OR_DEPARTMENT_OTHER): Payer: 59 | Admitting: Lab

## 2013-01-08 DIAGNOSIS — C719 Malignant neoplasm of brain, unspecified: Secondary | ICD-10-CM

## 2013-01-08 DIAGNOSIS — I82409 Acute embolism and thrombosis of unspecified deep veins of unspecified lower extremity: Secondary | ICD-10-CM

## 2013-01-08 DIAGNOSIS — I2699 Other pulmonary embolism without acute cor pulmonale: Secondary | ICD-10-CM

## 2013-01-08 DIAGNOSIS — C711 Malignant neoplasm of frontal lobe: Secondary | ICD-10-CM

## 2013-01-08 LAB — CBC WITH DIFFERENTIAL/PLATELET
Eosinophils Absolute: 0.1 10*3/uL (ref 0.0–0.5)
MCV: 102.8 fL — ABNORMAL HIGH (ref 79.3–98.0)
MONO#: 0.3 10*3/uL (ref 0.1–0.9)
MONO%: 6.9 % (ref 0.0–14.0)
NEUT#: 3.1 10*3/uL (ref 1.5–6.5)
RBC: 3.89 10*6/uL — ABNORMAL LOW (ref 4.20–5.82)
RDW: 15 % — ABNORMAL HIGH (ref 11.0–14.6)
WBC: 4.2 10*3/uL (ref 4.0–10.3)
lymph#: 0.7 10*3/uL — ABNORMAL LOW (ref 0.9–3.3)

## 2013-01-08 LAB — PROTIME-INR
INR: 1.5 — ABNORMAL LOW (ref 2.00–3.50)
Protime: 18 Seconds — ABNORMAL HIGH (ref 10.6–13.4)

## 2013-01-08 LAB — COMPREHENSIVE METABOLIC PANEL (CC13)
AST: 33 U/L (ref 5–34)
Albumin: 3.7 g/dL (ref 3.5–5.0)
Alkaline Phosphatase: 98 U/L (ref 40–150)
BUN: 17.9 mg/dL (ref 7.0–26.0)
Potassium: 4.3 mEq/L (ref 3.5–5.1)

## 2013-01-08 LAB — UA PROTEIN, DIPSTICK - CHCC: Protein, ur: 100 mg/dL

## 2013-01-08 NOTE — Telephone Encounter (Signed)
Per MD, informed patient of PT/INR lab results. Patient to take 10 mg of coumadin and to alternate with 12.5 mg. Patient verbalized understanding, no further questions at this time. Knows to call office with questions and concerns.

## 2013-01-18 ENCOUNTER — Ambulatory Visit: Payer: 59 | Admitting: Adult Health

## 2013-01-18 ENCOUNTER — Other Ambulatory Visit: Payer: 59 | Admitting: Lab

## 2013-01-19 ENCOUNTER — Other Ambulatory Visit (HOSPITAL_BASED_OUTPATIENT_CLINIC_OR_DEPARTMENT_OTHER): Payer: 59 | Admitting: Lab

## 2013-01-19 ENCOUNTER — Telehealth: Payer: Self-pay | Admitting: *Deleted

## 2013-01-19 ENCOUNTER — Ambulatory Visit (HOSPITAL_BASED_OUTPATIENT_CLINIC_OR_DEPARTMENT_OTHER): Payer: 59 | Admitting: Adult Health

## 2013-01-19 ENCOUNTER — Encounter: Payer: Self-pay | Admitting: Adult Health

## 2013-01-19 ENCOUNTER — Ambulatory Visit (HOSPITAL_BASED_OUTPATIENT_CLINIC_OR_DEPARTMENT_OTHER): Payer: 59

## 2013-01-19 VITALS — BP 135/88 | HR 65 | Temp 97.5°F | Resp 20 | Ht 71.0 in | Wt 166.1 lb

## 2013-01-19 DIAGNOSIS — C719 Malignant neoplasm of brain, unspecified: Secondary | ICD-10-CM

## 2013-01-19 DIAGNOSIS — Z7901 Long term (current) use of anticoagulants: Secondary | ICD-10-CM

## 2013-01-19 DIAGNOSIS — C711 Malignant neoplasm of frontal lobe: Secondary | ICD-10-CM

## 2013-01-19 DIAGNOSIS — I10 Essential (primary) hypertension: Secondary | ICD-10-CM

## 2013-01-19 DIAGNOSIS — M25519 Pain in unspecified shoulder: Secondary | ICD-10-CM

## 2013-01-19 DIAGNOSIS — Z5112 Encounter for antineoplastic immunotherapy: Secondary | ICD-10-CM

## 2013-01-19 DIAGNOSIS — I2699 Other pulmonary embolism without acute cor pulmonale: Secondary | ICD-10-CM

## 2013-01-19 DIAGNOSIS — I82409 Acute embolism and thrombosis of unspecified deep veins of unspecified lower extremity: Secondary | ICD-10-CM

## 2013-01-19 LAB — CBC WITH DIFFERENTIAL/PLATELET
BASO%: 0.3 % (ref 0.0–2.0)
Eosinophils Absolute: 0.1 10*3/uL (ref 0.0–0.5)
HCT: 39.9 % (ref 38.4–49.9)
HGB: 13.5 g/dL (ref 13.0–17.1)
MCHC: 33.8 g/dL (ref 32.0–36.0)
MONO#: 0.3 10*3/uL (ref 0.1–0.9)
RBC: 3.93 10*6/uL — ABNORMAL LOW (ref 4.20–5.82)
lymph#: 0.8 10*3/uL — ABNORMAL LOW (ref 0.9–3.3)
nRBC: 0 % (ref 0–0)

## 2013-01-19 LAB — COMPREHENSIVE METABOLIC PANEL (CC13)
ALT: 47 U/L (ref 0–55)
BUN: 18.3 mg/dL (ref 7.0–26.0)
CO2: 24 mEq/L (ref 22–29)
Calcium: 8.8 mg/dL (ref 8.4–10.4)
Chloride: 109 mEq/L — ABNORMAL HIGH (ref 98–107)
Creatinine: 0.8 mg/dL (ref 0.7–1.3)
Glucose: 92 mg/dl (ref 70–99)
Total Bilirubin: 0.43 mg/dL (ref 0.20–1.20)

## 2013-01-19 LAB — UA PROTEIN, DIPSTICK - CHCC: Protein, ur: 100 mg/dL

## 2013-01-19 LAB — PROTIME-INR
INR: 3 (ref 2.00–3.50)
Protime: 36 Seconds — ABNORMAL HIGH (ref 10.6–13.4)

## 2013-01-19 MED ORDER — SODIUM CHLORIDE 0.9 % IJ SOLN
10.0000 mL | INTRAMUSCULAR | Status: DC | PRN
  Administered 2013-01-19: 10 mL
  Filled 2013-01-19: qty 10

## 2013-01-19 MED ORDER — SODIUM CHLORIDE 0.9 % IV SOLN
10.0000 mg/kg | Freq: Once | INTRAVENOUS | Status: AC
  Administered 2013-01-19: 750 mg via INTRAVENOUS
  Filled 2013-01-19: qty 30

## 2013-01-19 MED ORDER — SODIUM CHLORIDE 0.9 % IV SOLN
Freq: Once | INTRAVENOUS | Status: AC
  Administered 2013-01-19: 12:00:00 via INTRAVENOUS

## 2013-01-19 MED ORDER — HEPARIN SOD (PORK) LOCK FLUSH 100 UNIT/ML IV SOLN
500.0000 [IU] | Freq: Once | INTRAVENOUS | Status: AC | PRN
  Administered 2013-01-19: 500 [IU]
  Filled 2013-01-19: qty 5

## 2013-01-19 NOTE — Telephone Encounter (Signed)
appts made and printed...td 

## 2013-01-19 NOTE — Patient Instructions (Addendum)
Doing well, proceed with Avastin.  Take Coumadin 10mg  daily.  Please call us if you have any questions or concerns.

## 2013-01-19 NOTE — Patient Instructions (Addendum)
[facility] Discharge Instructions for Patients Receiving Chemotherapy  Today you received the following chemotherapy agents Avastin.  To help prevent nausea and vomiting after your treatment, we encourage you to take your nausea medication as prescribed.   If you develop nausea and vomiting that is not controlled by your nausea medication, call the clinic.   BELOW ARE SYMPTOMS THAT SHOULD BE REPORTED IMMEDIATELY:  *FEVER GREATER THAN 100.5 F  *CHILLS WITH OR WITHOUT FEVER  NAUSEA AND VOMITING THAT IS NOT CONTROLLED WITH YOUR NAUSEA MEDICATION  *UNUSUAL SHORTNESS OF BREATH  *UNUSUAL BRUISING OR BLEEDING  TENDERNESS IN MOUTH AND THROAT WITH OR WITHOUT PRESENCE OF ULCERS  *URINARY PROBLEMS  *BOWEL PROBLEMS  UNUSUAL RASH Items with * indicate a potential emergency and should be followed up as soon as possible.  Feel free to call the clinic you have any questions or concerns. The clinic phone number is [phone].    

## 2013-01-19 NOTE — Progress Notes (Signed)
OFFICE PROGRESS NOTE  Dr. 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 The tumor chemotherapy consisted of Temodar 75 mg per meter squared.  #2 patient was then started on Temodar given days 1 through 5 every 28 days with concomitant Avastin given every 2 weeks.  #3 patient also developed lower extremity DVT as well as as pulmonary embolism he was started on Coumadin to try to maintain an INR between 2 and 2.5.  CURRENT THERAPY: Patient is here for his scheduled Avastin  INTERVAL HISTORY: Roger Raymond 62 y.o. male returns for followup visit today for his treatment. He started Temodar on June 2, and took it for five days.  His main side effect is fatigue, he denies nausea/vomiting, rash, or any other adverse effects.  He had a f/u with Dr. Sherwood Gambler and MRI showed no progression of disease.  He has a mild increase in bleeding when washing his face from his pores, but otherwise denies fevers chills, nausea, vomiting, constipation, diarrhea, headaches, numbness, vision changes, weakness, or any other concerns.  Otherwise a 10 point ROS is neg.   MEDICAL HISTORY: Past Medical History  Diagnosis Date  . 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 DVT (deep venous thrombosis) 07/08/2011    ALLERGIES:  has No Known Allergies.  MEDICATIONS:  Current Outpatient Prescriptions  Medication Sig Dispense Refill  . acetaminophen (TYLENOL) 325 MG tablet Take 650 mg by mouth every 6 (six) hours as needed.        . chlorproMAZINE (THORAZINE) 25 MG tablet Take 25 mg by mouth every 6 (six) hours as needed.        . diphenoxylate-atropine (LOMOTIL)  2.5-0.025 MG per tablet Take 1 tablet by mouth 4 (four) times daily as needed. 1-2 tabs prn       . hydrochlorothiazide (HYDRODIURIL) 25 MG tablet       . levETIRAcetam (KEPPRA) 500 MG tablet Take 500 mg by mouth 2 (two) times daily.        [name] lidocaine-prilocaine (EMLA) cream Apply topically as needed. Apply to port 1-2 hours before procedure  30 g  2  . lisinopril (PRINIVIL,ZESTRIL) 10 MG tablet Take 1 tablet (10 mg total) by mouth daily.  30 tablet  0  . ondansetron (ZOFRAN) 8 MG tablet Take 1 tablet (8 mg total) by mouth every 12 (twelve) hours as needed.  234 tablet  7  . sodium chloride (OCEAN) 0.65 % nasal spray Place 1 spray into the nose as needed.      . temozolomide (TEMODAR) 140 MG capsule Take 2 capsules daily x 5 days then repeat every 28 days  10 capsule  10  . warfarin (COUMADIN) 10 MG tablet Take 1 tablet (10 mg total) by mouth daily.  60 tablet  7  . warfarin (COUMADIN) 5 MG tablet Take 1 tablet (5 mg total) by mouth daily. Or as directed by physician  64 tablet  0   No current facility-administered medications for this visit.    SURGICAL HISTORY:  Past Surgical History  Procedure Laterality Date  . Vasectomy      REVIEW OF SYSTEMS:  General: fatigue (-), night  sweats (-), fever (-), pain (-) Lymph: palpable nodes (-) HEENT: vision changes (-), mucositis (-), gum bleeding (-), epistaxis (-) Cardiovascular: chest pain (-), palpitations (-) Pulmonary: shortness of breath (-), dyspnea on exertion (-), cough (-), hemoptysis (-) GI:  Early satiety (-), melena (-), dysphagia (-), nausea/vomiting (-), diarrhea (-) GU: dysuria (-), hematuria (-), incontinence (-) Musculoskeletal: joint swelling (-), joint pain (-), back pain (-) Neuro: weakness (-), numbness (-), headache (-), confusion (-) Skin: Rash (-), lesions (-), dryness (-) Psych: depression (-), suicidal/homicidal ideation (-), feeling of hopelessness (-)  PHYSICAL EXAMINATION:  BP 135/88  Pulse 65  Temp(Src) 97.5  F (36.4 C) (Oral)  Resp 20  Ht 5\' 11"  (1.803 m)  Wt 166 lb 1.6 oz (75.342 kg)  BMI 23.18 kg/m2 General: Patient is a well appearing male in no acute distress HEENT: PERRLA, sclerae anicteric no conjunctival pallor, MMM Neck: supple, no palpable adenopathy Lungs: clear to auscultation bilaterally, no wheezes, rhonchi, or rales Cardiovascular: regular rate rhythm, S1, S2, no murmurs, rubs or gallops Abdomen: Soft, non-tender, non-distended, normoactive bowel sounds, no HSM Extremities: warm and well perfused, no clubbing, cyanosis, or edema Skin: No rashes or lesions Neuro: CN II-XII intact.  Normal gait, 5/5 strength in all extremities.   ECOG PERFORMANCE STATUS: 0 - Asymptomatic    LABORATORY DATA: Lab Results  Component Value Date   WBC 3.7* 01/19/2013   HGB 13.5 01/19/2013   HCT 39.9 01/19/2013   MCV 101.5* 01/19/2013   PLT 156 01/19/2013      Chemistry      Component Value Date/Time   NA 140 01/08/2013 1518   NA 138 03/02/2012 1100   K 4.3 01/08/2013 1518   K 4.5 03/02/2012 1100   CL 104 01/08/2013 1518   CL 102 03/02/2012 1100   CO2 28 01/08/2013 1518   CO2 23 03/02/2012 1100   BUN 17.9 01/08/2013 1518   BUN 25* 03/02/2012 1100   CREATININE 1.2 01/08/2013 1518   CREATININE 1.05 03/02/2012 1100      Component Value Date/Time   CALCIUM 9.1 01/08/2013 1518   CALCIUM 9.5 03/02/2012 1100   ALKPHOS 98 01/08/2013 1518   ALKPHOS 130* 03/02/2012 1100   AST 33 01/08/2013 1518   AST 39* 03/02/2012 1100   ALT 42 01/08/2013 1518   ALT 65* 03/02/2012 1100   BILITOT 0.51 01/08/2013 1518   BILITOT 0.4 03/02/2012 1100       RADIOGRAPHIC STUDIES:  No results found.  ASSESSMENT: 62 year old gentleman with:  1.  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 Overall he is doing well and tolerating his therapy quite well. He also is getting  Avastin and tolerating it very well.  2.  For his pulmonary embolus he is on Coumadin and he is very  therapeutic with his INR.   #3 shoulder pain I have recommended that he try some Ibuprofen 200 mg twice a day for the next 4-5 days to see if he gets any relief if not then he certainly can go see his orthopedic surgeon.  #4 Hypertension--controlled on Lisinopril and HCTZ  PLAN:  #1 Patient will proceed with scheduled Avastin today.  I reviewed his lab work with him in detail.    #2 we will continue anti-hypertensive regimen, he is normotensive today.    #3 He will restart temodar on 02/01/13 tentatively, he would like to delay this to July 14 due to a hiking  trip in [place] from July 3 through July 13.      #4 His INR is 3 today. He will take Coumadin 10mg  daily.  He was previously taking 10mg  alternating with 12.5mg .  We will recheck next week.    #5 He will return on 02/01/13 for his next Avastin.  All questions were answered. The patient knows to call the clinic with any problems, questions or concerns. We can certainly see the patient much sooner if necessary.  I spent >25 minutes counseling the patient face to face. The total time spent in the appointment was 30 minutes.   Suzan Garibaldi Sheppard Coil, [place] Phone: [phone] 01/19/2013, 11:34 AM

## 2013-01-25 ENCOUNTER — Other Ambulatory Visit (HOSPITAL_BASED_OUTPATIENT_CLINIC_OR_DEPARTMENT_OTHER): Payer: 59

## 2013-01-25 ENCOUNTER — Telehealth: Payer: Self-pay

## 2013-01-25 DIAGNOSIS — I82409 Acute embolism and thrombosis of unspecified deep veins of unspecified lower extremity: Secondary | ICD-10-CM

## 2013-01-25 DIAGNOSIS — C719 Malignant neoplasm of brain, unspecified: Secondary | ICD-10-CM

## 2013-01-25 LAB — PROTIME-INR
INR: 2.6 (ref 2.00–3.50)
Protime: 31.2 Seconds — ABNORMAL HIGH (ref 10.6–13.4)

## 2013-01-25 NOTE — Telephone Encounter (Signed)
Spoke with pt regarding his INR results. Per LA, INR is stable. Continue on current dose of Coumadin. Pt voiced understanding and knows to call the office with any further questions. TMB

## 2013-02-01 ENCOUNTER — Ambulatory Visit (HOSPITAL_BASED_OUTPATIENT_CLINIC_OR_DEPARTMENT_OTHER): Payer: 59 | Admitting: Adult Health

## 2013-02-01 ENCOUNTER — Telehealth: Payer: Self-pay | Admitting: *Deleted

## 2013-02-01 ENCOUNTER — Other Ambulatory Visit (HOSPITAL_BASED_OUTPATIENT_CLINIC_OR_DEPARTMENT_OTHER): Payer: 59

## 2013-02-01 ENCOUNTER — Encounter: Payer: Self-pay | Admitting: Adult Health

## 2013-02-01 ENCOUNTER — Ambulatory Visit (HOSPITAL_BASED_OUTPATIENT_CLINIC_OR_DEPARTMENT_OTHER): Payer: 59

## 2013-02-01 VITALS — BP 144/87 | HR 66 | Temp 97.3°F | Resp 20 | Ht 71.0 in | Wt 163.2 lb

## 2013-02-01 DIAGNOSIS — Z5112 Encounter for antineoplastic immunotherapy: Secondary | ICD-10-CM

## 2013-02-01 DIAGNOSIS — C719 Malignant neoplasm of brain, unspecified: Secondary | ICD-10-CM

## 2013-02-01 DIAGNOSIS — C711 Malignant neoplasm of frontal lobe: Secondary | ICD-10-CM

## 2013-02-01 DIAGNOSIS — I82409 Acute embolism and thrombosis of unspecified deep veins of unspecified lower extremity: Secondary | ICD-10-CM

## 2013-02-01 DIAGNOSIS — L919 Hypertrophic disorder of the skin, unspecified: Secondary | ICD-10-CM

## 2013-02-01 DIAGNOSIS — I10 Essential (primary) hypertension: Secondary | ICD-10-CM

## 2013-02-01 DIAGNOSIS — I2699 Other pulmonary embolism without acute cor pulmonale: Secondary | ICD-10-CM

## 2013-02-01 LAB — COMPREHENSIVE METABOLIC PANEL (CC13)
ALT: 49 U/L (ref 0–55)
AST: 38 U/L — ABNORMAL HIGH (ref 5–34)
CO2: 27 mEq/L (ref 22–29)
Chloride: 104 mEq/L (ref 98–109)
Potassium: 4.4 mEq/L (ref 3.5–5.1)
Total Bilirubin: 0.44 mg/dL (ref 0.20–1.20)
Total Protein: 6.5 g/dL (ref 6.4–8.3)

## 2013-02-01 LAB — PROTIME-INR: INR: 3.2 (ref 2.00–3.50)

## 2013-02-01 LAB — CBC WITH DIFFERENTIAL/PLATELET
BASO%: 0.6 % (ref 0.0–2.0)
Eosinophils Absolute: 0.1 10*3/uL (ref 0.0–0.5)
LYMPH%: 17.4 % (ref 14.0–49.0)
MCHC: 34.3 g/dL (ref 32.0–36.0)
MONO#: 0.4 10*3/uL (ref 0.1–0.9)
MONO%: 9.9 % (ref 0.0–14.0)
NEUT#: 2.5 10*3/uL (ref 1.5–6.5)
Platelets: 139 10*3/uL — ABNORMAL LOW (ref 140–400)
RBC: 4.13 10*6/uL — ABNORMAL LOW (ref 4.20–5.82)
RDW: 13.8 % (ref 11.0–14.6)
WBC: 3.6 10*3/uL — ABNORMAL LOW (ref 4.0–10.3)
nRBC: 0 % (ref 0–0)

## 2013-02-01 MED ORDER — SODIUM CHLORIDE 0.9 % IJ SOLN
10.0000 mL | INTRAMUSCULAR | Status: DC | PRN
  Administered 2013-02-01: 10 mL
  Filled 2013-02-01: qty 10

## 2013-02-01 MED ORDER — ENOXAPARIN SODIUM 120 MG/0.8ML ~~LOC~~ SOLN: 1.5000 mg/kg | Freq: Every day | SUBCUTANEOUS | Status: DC

## 2013-02-01 MED ORDER — SODIUM CHLORIDE 0.9 % IV SOLN
Freq: Once | INTRAVENOUS | Status: AC
  Administered 2013-02-01: 13:00:00 via INTRAVENOUS

## 2013-02-01 MED ORDER — HEPARIN SOD (PORK) LOCK FLUSH 100 UNIT/ML IV SOLN
500.0000 [IU] | Freq: Once | INTRAVENOUS | Status: AC | PRN
  Administered 2013-02-01: 500 [IU]
  Filled 2013-02-01: qty 5

## 2013-02-01 MED ORDER — SODIUM CHLORIDE 0.9 % IV SOLN
10.0000 mg/kg | Freq: Once | INTRAVENOUS | Status: AC
  Administered 2013-02-01: 750 mg via INTRAVENOUS
  Filled 2013-02-01: qty 30

## 2013-02-01 NOTE — Telephone Encounter (Signed)
Per staff phone call and POF I have schedueld appts.  JMW  

## 2013-02-01 NOTE — Telephone Encounter (Signed)
appts made and printed...td 

## 2013-02-01 NOTE — Patient Instructions (Addendum)
Doing well. Proceed with Avastin.  Stop Coumadin on Tuesday, start lovenox on Wednesday.  110 mg daily.  You will restart temodar on July 14.    Please call us if you have any questions or concerns.

## 2013-02-01 NOTE — Patient Instructions (Signed)
[facility] Discharge Instructions for Patients Receiving Chemotherapy  Today you received the following chemotherapy agents: avastin   To help prevent nausea and vomiting after your treatment, we encourage you to take your nausea medication.  Take it as often as prescribed.     If you develop nausea and vomiting that is not controlled by your nausea medication, call the clinic. If it is after clinic hours your family physician or the after hours number for the clinic or go to the Emergency Department.   BELOW ARE SYMPTOMS THAT SHOULD BE REPORTED IMMEDIATELY:  *FEVER GREATER THAN 100.5 F  *CHILLS WITH OR WITHOUT FEVER  NAUSEA AND VOMITING THAT IS NOT CONTROLLED WITH YOUR NAUSEA MEDICATION  *UNUSUAL SHORTNESS OF BREATH  *UNUSUAL BRUISING OR BLEEDING  TENDERNESS IN MOUTH AND THROAT WITH OR WITHOUT PRESENCE OF ULCERS  *URINARY PROBLEMS  *BOWEL PROBLEMS  UNUSUAL RASH Items with * indicate a potential emergency and should be followed up as soon as possible.  Feel free to call the clinic you have any questions or concerns. The clinic phone number is [phone].   I have been informed and understand all the instructions given to me. I know to contact the clinic, my physician, or go to the Emergency Department if any problems should occur. I do not have any questions at this time, but understand that I may call the clinic during office hours   should I have any questions or need assistance in obtaining follow up care.    __________________________________________  _____________  __________ Signature of Patient or Authorized Representative            Date                   Time    __________________________________________ Nurse's Signature    

## 2013-02-01 NOTE — Progress Notes (Signed)
OFFICE PROGRESS NOTE  Dr. 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 The tumor chemotherapy consisted of Temodar 75 mg per meter squared.  #2 patient was then started on Temodar given days 1 through 5 every 28 days with concomitant Avastin given every 2 weeks.  #3 patient also developed lower extremity DVT as well as as pulmonary embolism he was started on Coumadin to try to maintain an INR between 2 and 2.5.  CURRENT THERAPY: Patient is here for his scheduled Avastin  INTERVAL HISTORY: Roger Raymond 62 y.o. male returns for followup visit today for his treatment. He started Temodar on June 2, and took it for five days.  He is doing well today.  He is due to start temodar again today, however he is leaving for [place], [place] on Wednesday and would like to delay this by 2 weeks.  He denies fevers, chills, nausea, vomiting, constipation, diarrhea, numbness, weakness, headaches, blurred vision, easy bruising/bleeding, swelling, oliguria, dysuria, or any other concerns.  A 10 point ROS is neg.   MEDICAL HISTORY: Past Medical History  Diagnosis Date  . 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 DVT (deep venous thrombosis) 07/08/2011    ALLERGIES:  has No Known Allergies.  MEDICATIONS:  Current Outpatient Prescriptions  Medication Sig Dispense Refill  . acetaminophen (TYLENOL) 325 MG tablet Take 650 mg by mouth every 6 (six) hours as needed.        . chlorproMAZINE (THORAZINE) 25 MG tablet Take 25 mg by mouth every 6 (six) hours as needed.        . diphenoxylate-atropine (LOMOTIL) 2.5-0.025 MG per tablet Take 1 tablet by mouth 4  (four) times daily as needed. 1-2 tabs prn       . hydrochlorothiazide (HYDRODIURIL) 25 MG tablet       . levETIRAcetam (KEPPRA) 500 MG tablet Take 500 mg by mouth 2 (two) times daily.        [name] lidocaine-prilocaine (EMLA) cream Apply topically as needed. Apply to port 1-2 hours before procedure  30 g  2  . lisinopril (PRINIVIL,ZESTRIL) 10 MG tablet Take 1 tablet (10 mg total) by mouth daily.  30 tablet  0  . ondansetron (ZOFRAN) 8 MG tablet Take 1 tablet (8 mg total) by mouth every 12 (twelve) hours as needed.  234 tablet  7  . sodium chloride (OCEAN) 0.65 % nasal spray Place 1 spray into the nose as needed.      . temozolomide (TEMODAR) 140 MG capsule Take 2 capsules daily x 5 days then repeat every 28 days  10 capsule  10  . warfarin (COUMADIN) 10 MG tablet Take 1 tablet (10 mg total) by mouth daily.  60 tablet  7  . warfarin (COUMADIN) 5 MG tablet Take 1 tablet (5 mg total) by mouth daily. Or as directed by physician  64 tablet  0   No current facility-administered medications for this visit.    SURGICAL HISTORY:  Past Surgical History  Procedure Laterality Date  . Vasectomy      REVIEW OF SYSTEMS:  General: fatigue (-), night sweats (-), fever (-), pain (-) Lymph: palpable nodes (-)  HEENT: vision changes (-), mucositis (-), gum bleeding (-), epistaxis (-) Cardiovascular: chest pain (-), palpitations (-) Pulmonary: shortness of breath (-), dyspnea on exertion (-), cough (-), hemoptysis (-) GI:  Early satiety (-), melena (-), dysphagia (-), nausea/vomiting (-), diarrhea (-) GU: dysuria (-), hematuria (-), incontinence (-) Musculoskeletal: joint swelling (-), joint pain (-), back pain (-) Neuro: weakness (-), numbness (-), headache (-), confusion (-) Skin: Rash (-), lesions (-), dryness (-) Psych: depression (-), suicidal/homicidal ideation (-), feeling of hopelessness (-)  PHYSICAL EXAMINATION:  BP 144/87  Pulse 66  Temp(Src) 97.3 F (36.3 C) (Oral)  Resp 20  Ht 5\' 11"  (1.803  m)  Wt 163 lb 3.2 oz (74.027 kg)  BMI 22.77 kg/m2 General: Patient is a well appearing male in no acute distress HEENT: PERRLA, sclerae anicteric no conjunctival pallor, MMM Neck: supple, no palpable adenopathy Lungs: clear to auscultation bilaterally, no wheezes, rhonchi, or rales Cardiovascular: regular rate rhythm, S1, S2, no murmurs, rubs or gallops Abdomen: Soft, non-tender, non-distended, normoactive bowel sounds, no HSM Extremities: warm and well perfused, no clubbing, cyanosis, or edema Skin: No rashes or lesions Neuro: CN II-XII intact.  Normal gait, 5/5 strength in all extremities.   ECOG PERFORMANCE STATUS: 0 - Asymptomatic    LABORATORY DATA: Lab Results  Component Value Date   WBC 3.6* 02/01/2013   HGB 14.3 02/01/2013   HCT 41.7 02/01/2013   MCV 101.0* 02/01/2013   PLT 139* 02/01/2013      Chemistry      Component Value Date/Time   NA 141 01/19/2013 1102   NA 138 03/02/2012 1100   K 4.3 01/19/2013 1102   K 4.5 03/02/2012 1100   CL 109* 01/19/2013 1102   CL 102 03/02/2012 1100   CO2 24 01/19/2013 1102   CO2 23 03/02/2012 1100   BUN 18.3 01/19/2013 1102   BUN 25* 03/02/2012 1100   CREATININE 0.8 01/19/2013 1102   CREATININE 1.05 03/02/2012 1100      Component Value Date/Time   CALCIUM 8.8 01/19/2013 1102   CALCIUM 9.5 03/02/2012 1100   ALKPHOS 122 01/19/2013 1102   ALKPHOS 130* 03/02/2012 1100   AST 34 01/19/2013 1102   AST 39* 03/02/2012 1100   ALT 47 01/19/2013 1102   ALT 65* 03/02/2012 1100   BILITOT 0.43 01/19/2013 1102   BILITOT 0.4 03/02/2012 1100       RADIOGRAPHIC STUDIES:  No results found.  ASSESSMENT: 62 year old gentleman with:  1.  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 Overall he is doing well and tolerating his therapy quite well. He also is getting  Avastin and tolerating it very well.  2.  For his pulmonary embolus he is on Coumadin and he is very therapeutic with his INR.   #3  Hypertension--controlled on Lisinopril and HCTZ  PLAN:  #1 Patient will proceed with scheduled Avastin today.  I reviewed his lab work with him in detail.    #2 we will continue anti-hypertensive regimen and monitor his BP closely.    #3 He will restart temodar on 02/15/13.    #4 Due to his camping trip he will take Enoxaparin 1.5mg /kg/day starting Wednesday.  He will not have any coumadin 24 hours prior to starting his Enoxaparin.    #5 He will return on 02/15/13 for his next Avastin.  All questions were answered. The patient knows to call the clinic with any problems, questions or concerns. We can  certainly see the patient much sooner if necessary.  I spent >25 minutes counseling the patient face to face. The total time spent in the appointment was 30 minutes.   Suzan Garibaldi Sheppard Coil, [place] Phone: [phone] 02/01/2013, 11:33 AM

## 2013-02-15 ENCOUNTER — Other Ambulatory Visit (HOSPITAL_BASED_OUTPATIENT_CLINIC_OR_DEPARTMENT_OTHER): Payer: 59 | Admitting: Lab

## 2013-02-15 ENCOUNTER — Encounter: Payer: Self-pay | Admitting: Adult Health

## 2013-02-15 ENCOUNTER — Ambulatory Visit (HOSPITAL_BASED_OUTPATIENT_CLINIC_OR_DEPARTMENT_OTHER): Payer: 59 | Admitting: Adult Health

## 2013-02-15 ENCOUNTER — Ambulatory Visit (HOSPITAL_BASED_OUTPATIENT_CLINIC_OR_DEPARTMENT_OTHER): Payer: 59

## 2013-02-15 ENCOUNTER — Telehealth: Payer: Self-pay | Admitting: *Deleted

## 2013-02-15 VITALS — BP 128/85 | HR 59 | Temp 97.3°F | Resp 18 | Ht 71.0 in | Wt 163.0 lb

## 2013-02-15 DIAGNOSIS — C711 Malignant neoplasm of frontal lobe: Secondary | ICD-10-CM

## 2013-02-15 DIAGNOSIS — Z5112 Encounter for antineoplastic immunotherapy: Secondary | ICD-10-CM

## 2013-02-15 DIAGNOSIS — I10 Essential (primary) hypertension: Secondary | ICD-10-CM

## 2013-02-15 DIAGNOSIS — I2699 Other pulmonary embolism without acute cor pulmonale: Secondary | ICD-10-CM

## 2013-02-15 DIAGNOSIS — I82409 Acute embolism and thrombosis of unspecified deep veins of unspecified lower extremity: Secondary | ICD-10-CM

## 2013-02-15 DIAGNOSIS — C719 Malignant neoplasm of brain, unspecified: Secondary | ICD-10-CM

## 2013-02-15 LAB — BASIC METABOLIC PANEL (CC13)
CO2: 28 mEq/L (ref 22–29)
Glucose: 98 mg/dl (ref 70–140)
Potassium: 4.4 mEq/L (ref 3.5–5.1)
Sodium: 141 mEq/L (ref 136–145)

## 2013-02-15 LAB — CBC WITH DIFFERENTIAL/PLATELET
Eosinophils Absolute: 0.2 10*3/uL (ref 0.0–0.5)
MONO#: 0.4 10*3/uL (ref 0.1–0.9)
NEUT#: 2.6 10*3/uL (ref 1.5–6.5)
RBC: 4.01 10*6/uL — ABNORMAL LOW (ref 4.20–5.82)
RDW: 14.2 % (ref 11.0–14.6)
WBC: 4.1 10*3/uL (ref 4.0–10.3)
lymph#: 0.8 10*3/uL — ABNORMAL LOW (ref 0.9–3.3)

## 2013-02-15 LAB — PROTIME-INR

## 2013-02-15 LAB — UA PROTEIN, DIPSTICK - CHCC: Protein, ur: 100 mg/dL

## 2013-02-15 MED ORDER — BEVACIZUMAB CHEMO INJECTION 400 MG/16ML
10.0000 mg/kg | Freq: Once | INTRAVENOUS | Status: AC
  Administered 2013-02-15: 750 mg via INTRAVENOUS
  Filled 2013-02-15: qty 30

## 2013-02-15 MED ORDER — SODIUM CHLORIDE 0.9 % IV SOLN
Freq: Once | INTRAVENOUS | Status: AC
  Administered 2013-02-15: 11:00:00 via INTRAVENOUS

## 2013-02-15 MED ORDER — HEPARIN SOD (PORK) LOCK FLUSH 100 UNIT/ML IV SOLN
500.0000 [IU] | Freq: Once | INTRAVENOUS | Status: AC | PRN
  Administered 2013-02-15: 500 [IU]
  Filled 2013-02-15: qty 5

## 2013-02-15 MED ORDER — SODIUM CHLORIDE 0.9 % IJ SOLN
10.0000 mL | INTRAMUSCULAR | Status: DC | PRN
  Administered 2013-02-15: 10 mL
  Filled 2013-02-15: qty 10

## 2013-02-15 NOTE — Patient Instructions (Signed)
Bevacizumab injection What is this medicine? BEVACIZUMAB (be va SIZ yoo mab) is a chemotherapy drug. It targets a protein found in many cancer cell types, and halts cancer growth. This drug treats many cancers including non-small cell lung cancer, and colon or rectal cancer. It is usually given with other chemotherapy drugs. This medicine may be used for other purposes; ask your health care provider or pharmacist if you have questions. What should I tell my health care provider before I take this medicine? They need to know if you have any of these conditions: -blood clots -heart disease, including heart failure, heart attack, or chest pain (angina) -high blood pressure -infection (especially a virus infection such as chickenpox, cold sores, or herpes) -kidney disease -lung disease -prior chemotherapy with doxorubicin, daunorubicin, epirubicin, or other anthracycline type chemotherapy agents -recent or ongoing radiation therapy -recent surgery -stroke -an unusual or allergic reaction to bevacizumab, hamster proteins, mouse proteins, other medicines, foods, dyes, or preservatives -pregnant or trying to get pregnant -breast-feeding How should I use this medicine? This medicine is for infusion into a vein. It is given by a health care professional in a hospital or clinic setting. Talk to your pediatrician regarding the use of this medicine in children. Special care may be needed. Overdosage: If you think you have taken too much of this medicine contact a poison control center or emergency room at once. NOTE: This medicine is only for you. Do not share this medicine with others. What if I miss a dose? It is important not to miss your dose. Call your doctor or health care professional if you are unable to keep an appointment. What may interact with this medicine? Interactions are not expected. This list may not describe all possible interactions. Give your health care provider a list of all  the medicines, herbs, non-prescription drugs, or dietary supplements you use. Also tell them if you smoke, drink alcohol, or use illegal drugs. Some items may interact with your medicine. What should I watch for while using this medicine? Your condition will be monitored carefully while you are receiving this medicine. You will need important blood work and urine testing done while you are taking this medicine. During your treatment, let your health care professional know if you have any unusual symptoms, such as difficulty breathing. This medicine may rarely cause 'gastrointestinal perforation' (holes in the stomach, intestines or colon), a serious side effect requiring surgery to repair. This medicine should be started at least 28 days following major surgery and the site of the surgery should be totally healed. Check with your doctor before scheduling dental work or surgery while you are receiving this treatment. Talk to your doctor if you have recently had surgery or if you have a wound that has not healed. Do not become pregnant while taking this medicine. Women should inform their doctor if they wish to become pregnant or think they might be pregnant. There is a potential for serious side effects to an unborn child. Talk to your health care professional or pharmacist for more information. Do not breast-feed an infant while taking this medicine. This medicine has caused ovarian failure in some women. This medicine may interfere with the ability to have a child. You should talk to your doctor or health care professional if you are concerned about your fertility. What side effects may I notice from receiving this medicine? Side effects that you should report to your doctor or health care professional as soon as possible: -allergic reactions like skin   rash, itching or hives, swelling of the face, lips, or tongue -signs of infection - fever or chills, cough, sore throat, pain or trouble passing  urine -signs of decreased platelets or bleeding - bruising, pinpoint red spots on the skin, black, tarry stools, nosebleeds, blood in the urine -breathing problems -changes in vision -chest pain -confusion -jaw pain, especially after dental work -mouth sores -seizures -severe abdominal pain -severe headache -sudden numbness or weakness of the face, arm or leg -swelling of legs or ankles -symptoms of a stroke: change in mental awareness, inability to talk or move one side of the body (especially in patients with lung cancer) -trouble passing urine or change in the amount of urine -trouble speaking or understanding -trouble walking, dizziness, loss of balance or coordination Side effects that usually do not require medical attention (report to your doctor or health care professional if they continue or are bothersome): -constipation -diarrhea -dry skin -headache -loss of appetite -nausea, vomiting This list may not describe all possible side effects. Call your doctor for medical advice about side effects. You may report side effects to FDA at 1-800-FDA-1088. Where should I keep my medicine? This drug is given in a hospital or clinic and will not be stored at home. NOTE: This sheet is a summary. It may not cover all possible information. If you have questions about this medicine, talk to your doctor, pharmacist, or health care provider.  2013, [employer]. (06/22/2010 4:25:37 PM)  

## 2013-02-15 NOTE — Telephone Encounter (Signed)
appts made and printed...td 

## 2013-02-15 NOTE — Patient Instructions (Addendum)
Restart Coumadin at your previous dose.  Continue your Lovenox injections.  Please call us at [phone] if you develop any easy bruising, bleeding.

## 2013-02-15 NOTE — Progress Notes (Signed)
OFFICE PROGRESS NOTE  Dr. 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 The tumor chemotherapy consisted of Temodar 75 mg per meter squared.  #2 patient was then started on Temodar given days 1 through 5 every 28 days with concomitant Avastin given every 2 weeks.  #3 patient also developed lower extremity DVT as well as as pulmonary embolism he was started on Coumadin to try to maintain an INR between 2 and 2.5.  CURRENT THERAPY: Patient is here for his scheduled Avastin, Temodar to start tonight.    INTERVAL HISTORY: Roger Raymond 62 y.o. male returns for followup visit today for his treatment. He has returned from [place] and is doing well.  He denies fevers, chills, weakness, numbness, vision changes, blurred vision, diplopia, swelling, easy bruising/bleeding, nausea, vomiting, or any further concerns.  A 10 point ROS is otherwise negative.    MEDICAL HISTORY: Past Medical History  Diagnosis Date  . 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 DVT (deep venous thrombosis) 07/08/2011    ALLERGIES:  has No Known Allergies.  MEDICATIONS:  Current Outpatient Prescriptions  Medication Sig Dispense Refill  . acetaminophen (TYLENOL) 325 MG tablet Take 650 mg by mouth every 6 (six) hours as needed.        . chlorproMAZINE (THORAZINE) 25 MG tablet Take 25 mg by mouth every 6 (six) hours as needed.        . diphenoxylate-atropine (LOMOTIL) 2.5-0.025 MG per tablet Take 1 tablet by mouth 4 (four) times daily as needed. 1-2 tabs prn       . enoxaparin (LOVENOX) 120 MG/0.8ML injection Inject 0.74 mLs (110 mg total) into the skin daily.  20  Syringe  0  . hydrochlorothiazide (HYDRODIURIL) 25 MG tablet       . levETIRAcetam (KEPPRA) 500 MG tablet Take 500 mg by mouth 2 (two) times daily.        [name] lidocaine-prilocaine (EMLA) cream Apply topically as needed. Apply to port 1-2 hours before procedure  30 g  2  . lisinopril (PRINIVIL,ZESTRIL) 10 MG tablet Take 1 tablet (10 mg total) by mouth daily.  30 tablet  0  . ondansetron (ZOFRAN) 8 MG tablet Take 1 tablet (8 mg total) by mouth every 12 (twelve) hours as needed.  234 tablet  7  . sodium chloride (OCEAN) 0.65 % nasal spray Place 1 spray into the nose as needed.      . temozolomide (TEMODAR) 140 MG capsule Take 2 capsules daily x 5 days then repeat every 28 days  10 capsule  10  . warfarin (COUMADIN) 10 MG tablet Take 1 tablet (10 mg total) by mouth daily.  60 tablet  7  . warfarin (COUMADIN) 5 MG tablet Take 1 tablet (5 mg total) by mouth daily. Or as directed by physician  64 tablet  0   No current facility-administered medications for this visit.    SURGICAL HISTORY:  Past Surgical History  Procedure Laterality Date  . Vasectomy      REVIEW OF SYSTEMS:  General: fatigue (-), night sweats (-), fever (-), pain (-) Lymph: palpable nodes (-) HEENT: vision changes (-), mucositis (-), gum  bleeding (-), epistaxis (-) Cardiovascular: chest pain (-), palpitations (-) Pulmonary: shortness of breath (-), dyspnea on exertion (-), cough (-), hemoptysis (-) GI:  Early satiety (-), melena (-), dysphagia (-), nausea/vomiting (-), diarrhea (-) GU: dysuria (-), hematuria (-), incontinence (-) Musculoskeletal: joint swelling (-), joint pain (-), back pain (-) Neuro: weakness (-), numbness (-), headache (-), confusion (-) Skin: Rash (-), lesions (-), dryness (-) Psych: depression (-), suicidal/homicidal ideation (-), feeling of hopelessness (-)  PHYSICAL EXAMINATION:  BP 128/85  Pulse 59  Temp(Src) 97.3 F (36.3 C) (Oral)  Resp 18  Ht 5\' 11"  (1.803 m)  Wt 163 lb (73.936 kg)  BMI  22.74 kg/m2 General: Patient is a well appearing male in no acute distress HEENT: PERRLA, sclerae anicteric no conjunctival pallor, MMM Neck: supple, no palpable adenopathy Lungs: clear to auscultation bilaterally, no wheezes, rhonchi, or rales Cardiovascular: regular rate rhythm, S1, S2, no murmurs, rubs or gallops Abdomen: Soft, non-tender, non-distended, normoactive bowel sounds, no HSM Extremities: warm and well perfused, no clubbing, cyanosis, or edema Skin: No rashes or lesions Neuro: CN II-XII intact.  Normal gait, 5/5 strength in all extremities.   ECOG PERFORMANCE STATUS: 0 - Asymptomatic    LABORATORY DATA: Lab Results  Component Value Date   WBC 4.1 02/15/2013   HGB 14.1 02/15/2013   HCT 41.8 02/15/2013   MCV 104.3* 02/15/2013   PLT 157 02/15/2013      Chemistry      Component Value Date/Time   NA 141 02/15/2013 1008   NA 138 03/02/2012 1100   K 4.4 02/15/2013 1008   K 4.5 03/02/2012 1100   CL 109* 01/19/2013 1102   CL 102 03/02/2012 1100   CO2 28 02/15/2013 1008   CO2 23 03/02/2012 1100   BUN 14.8 02/15/2013 1008   BUN 25* 03/02/2012 1100   CREATININE 1.0 02/15/2013 1008   CREATININE 1.05 03/02/2012 1100      Component Value Date/Time   CALCIUM 9.6 02/15/2013 1008   CALCIUM 9.5 03/02/2012 1100   ALKPHOS 114 02/01/2013 1059   ALKPHOS 130* 03/02/2012 1100   AST 38* 02/01/2013 1059   AST 39* 03/02/2012 1100   ALT 49 02/01/2013 1059   ALT 65* 03/02/2012 1100   BILITOT 0.44 02/01/2013 1059   BILITOT 0.4 03/02/2012 1100       RADIOGRAPHIC STUDIES:  No results found.  ASSESSMENT: 62 year old gentleman with:  1.  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 Overall he is doing well and tolerating his therapy quite well. He also is getting  Avastin and tolerating it very well.  2.  For his pulmonary embolus he is on Coumadin and he is very therapeutic with his INR.   #3 Hypertension--controlled on Lisinopril and  HCTZ  PLAN:  #1 Patient will proceed with scheduled Avastin today.  I reviewed his lab work with him in detail.    #2 we will continue anti-hypertensive regimen and monitor his BP closely.    #3 He will restart temodar tonight.      #4 He will restart his Coumadin at his previous dose, 10mg  daily.  He will continue Lovenox daily.  We will recheck his INR on Thursday.  He knows to call immediately for easy bruising/bleeding.   #5 He will return on 03/01/13 for his next Avastin.  All questions were answered. The patient knows to call the clinic with any problems, questions or concerns. We can certainly  see the patient much sooner if necessary.  I spent >25 minutes counseling the patient face to face. The total time spent in the appointment was 30 minutes.   Suzan Garibaldi Sheppard Coil, [place] Phone: [phone] 02/15/2013, 10:56 AM

## 2013-02-18 ENCOUNTER — Other Ambulatory Visit (HOSPITAL_BASED_OUTPATIENT_CLINIC_OR_DEPARTMENT_OTHER): Payer: 59

## 2013-02-18 DIAGNOSIS — C711 Malignant neoplasm of frontal lobe: Secondary | ICD-10-CM

## 2013-02-18 DIAGNOSIS — I82409 Acute embolism and thrombosis of unspecified deep veins of unspecified lower extremity: Secondary | ICD-10-CM

## 2013-02-23 ENCOUNTER — Other Ambulatory Visit (HOSPITAL_BASED_OUTPATIENT_CLINIC_OR_DEPARTMENT_OTHER): Payer: 59

## 2013-02-23 DIAGNOSIS — C719 Malignant neoplasm of brain, unspecified: Secondary | ICD-10-CM

## 2013-02-23 DIAGNOSIS — C711 Malignant neoplasm of frontal lobe: Secondary | ICD-10-CM

## 2013-02-23 DIAGNOSIS — I82409 Acute embolism and thrombosis of unspecified deep veins of unspecified lower extremity: Secondary | ICD-10-CM

## 2013-02-23 LAB — PROTIME-INR
INR: 1.9 — ABNORMAL LOW (ref 2.00–3.50)
Protime: 22.8 s — ABNORMAL HIGH (ref 10.6–13.4)

## 2013-02-24 ENCOUNTER — Telehealth: Payer: Self-pay | Admitting: Adult Health

## 2013-02-24 NOTE — Telephone Encounter (Signed)
Called patient and reviewed his INR.  It is 1.9.  He was instructed to stop lovenox and continue Coumadin at 10mg  daily, we will re-check INR on Monday.  Patient verbalized understanding.   Cherie Ouch Lyn Hollingshead, NP Medical Oncology [facility] Phone: [phone]

## 2013-02-25 ENCOUNTER — Ambulatory Visit: Payer: 59

## 2013-03-01 ENCOUNTER — Ambulatory Visit (HOSPITAL_BASED_OUTPATIENT_CLINIC_OR_DEPARTMENT_OTHER): Payer: 59

## 2013-03-01 ENCOUNTER — Telehealth: Payer: Self-pay | Admitting: *Deleted

## 2013-03-01 ENCOUNTER — Encounter: Payer: Self-pay | Admitting: Adult Health

## 2013-03-01 ENCOUNTER — Other Ambulatory Visit (HOSPITAL_BASED_OUTPATIENT_CLINIC_OR_DEPARTMENT_OTHER): Payer: 59

## 2013-03-01 ENCOUNTER — Ambulatory Visit (HOSPITAL_BASED_OUTPATIENT_CLINIC_OR_DEPARTMENT_OTHER): Payer: 59 | Admitting: Adult Health

## 2013-03-01 VITALS — BP 117/82 | HR 57

## 2013-03-01 VITALS — BP 116/77 | HR 63 | Temp 98.5°F | Resp 20 | Ht 71.0 in | Wt 160.1 lb

## 2013-03-01 DIAGNOSIS — I2699 Other pulmonary embolism without acute cor pulmonale: Secondary | ICD-10-CM

## 2013-03-01 DIAGNOSIS — I82409 Acute embolism and thrombosis of unspecified deep veins of unspecified lower extremity: Secondary | ICD-10-CM

## 2013-03-01 DIAGNOSIS — C711 Malignant neoplasm of frontal lobe: Secondary | ICD-10-CM

## 2013-03-01 DIAGNOSIS — C719 Malignant neoplasm of brain, unspecified: Secondary | ICD-10-CM

## 2013-03-01 DIAGNOSIS — I10 Essential (primary) hypertension: Secondary | ICD-10-CM

## 2013-03-01 DIAGNOSIS — Z5112 Encounter for antineoplastic immunotherapy: Secondary | ICD-10-CM

## 2013-03-01 LAB — PROTIME-INR: INR: 1.8 — ABNORMAL LOW (ref 2.00–3.50)

## 2013-03-01 LAB — BASIC METABOLIC PANEL (CC13)
CO2: 25 mEq/L (ref 22–29)
Chloride: 107 mEq/L (ref 98–109)
Creatinine: 0.9 mg/dL (ref 0.7–1.3)
Potassium: 4.9 mEq/L (ref 3.5–5.1)

## 2013-03-01 LAB — CBC WITH DIFFERENTIAL/PLATELET
Eosinophils Absolute: 0.2 10*3/uL (ref 0.0–0.5)
HCT: 42.5 % (ref 38.4–49.9)
LYMPH%: 20.7 % (ref 14.0–49.0)
MONO#: 0.4 10*3/uL (ref 0.1–0.9)
NEUT#: 2.6 10*3/uL (ref 1.5–6.5)
Platelets: 178 10*3/uL (ref 140–400)
RBC: 4.28 10*6/uL (ref 4.20–5.82)
WBC: 4 10*3/uL (ref 4.0–10.3)
nRBC: 0 % (ref 0–0)

## 2013-03-01 MED ORDER — SODIUM CHLORIDE 0.9 % IV SOLN
10.0000 mg/kg | Freq: Once | INTRAVENOUS | Status: AC
  Administered 2013-03-01: 750 mg via INTRAVENOUS
  Filled 2013-03-01: qty 30

## 2013-03-01 MED ORDER — SODIUM CHLORIDE 0.9 % IJ SOLN
10.0000 mL | INTRAMUSCULAR | Status: DC | PRN
  Administered 2013-03-01: 10 mL
  Filled 2013-03-01: qty 10

## 2013-03-01 MED ORDER — HEPARIN SOD (PORK) LOCK FLUSH 100 UNIT/ML IV SOLN
500.0000 [IU] | Freq: Once | INTRAVENOUS | Status: AC | PRN
  Administered 2013-03-01: 500 [IU]
  Filled 2013-03-01: qty 5

## 2013-03-01 MED ORDER — SODIUM CHLORIDE 0.9 % IV SOLN
Freq: Once | INTRAVENOUS | Status: AC
  Administered 2013-03-01: 11:00:00 via INTRAVENOUS

## 2013-03-01 NOTE — Patient Instructions (Addendum)
[facility] Discharge Instructions for Patients Receiving Chemotherapy  Today you received the following chemotherapy agents: Avastin  To help prevent nausea and vomiting after your treatment, we encourage you to take your nausea medication as directed by your physician.   If you develop nausea and vomiting that is not controlled by your nausea medication, call the clinic.   BELOW ARE SYMPTOMS THAT SHOULD BE REPORTED IMMEDIATELY:  *FEVER GREATER THAN 100.5 F  *CHILLS WITH OR WITHOUT FEVER  NAUSEA AND VOMITING THAT IS NOT CONTROLLED WITH YOUR NAUSEA MEDICATION  *UNUSUAL SHORTNESS OF BREATH  *UNUSUAL BRUISING OR BLEEDING  TENDERNESS IN MOUTH AND THROAT WITH OR WITHOUT PRESENCE OF ULCERS  *URINARY PROBLEMS  *BOWEL PROBLEMS  UNUSUAL RASH Items with * indicate a potential emergency and should be followed up as soon as possible.  Feel free to call the clinic you have any questions or concerns. The clinic phone number is [phone].

## 2013-03-01 NOTE — Telephone Encounter (Signed)
appts made and printed. Pt is aware that im waiting on a time slot for 04/12/13. i sent LA and KK an email asking for one...td

## 2013-03-01 NOTE — Patient Instructions (Signed)
Doing well.  Proceed with Avastin.  Take Coumadin 10mg  four nights per week, and 12.5 mg three nights per week.  Please call us if you have any questions or concerns.

## 2013-03-01 NOTE — Telephone Encounter (Signed)
sw pt wife informed her that i was given a time slot gv aptts d/t for 04/12/13. Labs@ 8:15am and ov @ 8:45am.. Pt is aware...td

## 2013-03-01 NOTE — Progress Notes (Signed)
OFFICE PROGRESS NOTE  Dr. 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 The tumor chemotherapy consisted of Temodar 75 mg per meter squared.  #2 patient was then started on Temodar given days 1 through 5 every 28 days with concomitant Avastin given every 2 weeks.  #3 patient also developed lower extremity DVT as well as as pulmonary embolism he was started on Coumadin to try to maintain an INR between 2 and 2.5.  CURRENT THERAPY: Patient is here for his scheduled Avastin, Temodar 02/15/13-02/19/13.    INTERVAL HISTORY: Roger Raymond 62 y.o. male returns for followup visit today for his treatment. He is doing well today.  Slowly recovering from his Temodar.  He denies easy bruising, easy bleeding, headaches, weakness, numbness, vision changes, swelling, changes in urination, constipation, or any other concerns today.  A 10 point ROS is negative.   MEDICAL HISTORY: Past Medical History  Diagnosis Date  . 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 DVT (deep venous thrombosis) 07/08/2011    ALLERGIES:  has No Known Allergies.  MEDICATIONS:  Current Outpatient Prescriptions  Medication Sig Dispense Refill  . acetaminophen (TYLENOL) 325 MG tablet Take 650 mg by mouth every 6 (six) hours as needed.        . chlorproMAZINE (THORAZINE) 25 MG tablet Take 25 mg by mouth every 6 (six) hours as needed.        . diphenoxylate-atropine (LOMOTIL) 2.5-0.025 MG per tablet Take 1 tablet by mouth 4 (four) times daily as needed. 1-2 tabs prn       . hydrochlorothiazide (HYDRODIURIL) 25 MG tablet       . levETIRAcetam (KEPPRA) 500 MG tablet Take 500  mg by mouth 2 (two) times daily.        [name] lidocaine-prilocaine (EMLA) cream Apply topically as needed. Apply to port 1-2 hours before procedure  30 g  2  . lisinopril (PRINIVIL,ZESTRIL) 10 MG tablet Take 1 tablet (10 mg total) by mouth daily.  30 tablet  0  . ondansetron (ZOFRAN) 8 MG tablet Take 1 tablet (8 mg total) by mouth every 12 (twelve) hours as needed.  234 tablet  7  . sodium chloride (OCEAN) 0.65 % nasal spray Place 1 spray into the nose as needed.      . temozolomide (TEMODAR) 140 MG capsule Take 2 capsules daily x 5 days then repeat every 28 days  10 capsule  10  . warfarin (COUMADIN) 10 MG tablet Take 1 tablet (10 mg total) by mouth daily.  60 tablet  7  . enoxaparin (LOVENOX) 120 MG/0.8ML injection Inject 0.74 mLs (110 mg total) into the skin daily.  20 Syringe  0  . warfarin (COUMADIN) 5 MG tablet Take 1 tablet (5 mg total) by mouth daily. Or as directed by physician  64 tablet  0   No current facility-administered medications for this visit.    SURGICAL HISTORY:  Past Surgical History  Procedure Laterality Date  . Vasectomy      REVIEW OF SYSTEMS:  General: fatigue (-), night sweats (-), fever (-), pain (-) Lymph: palpable nodes (-) HEENT: vision changes (-), mucositis (-), gum bleeding (-),  epistaxis (-) Cardiovascular: chest pain (-), palpitations (-) Pulmonary: shortness of breath (-), dyspnea on exertion (-), cough (-), hemoptysis (-) GI:  Early satiety (-), melena (-), dysphagia (-), nausea/vomiting (-), diarrhea (-) GU: dysuria (-), hematuria (-), incontinence (-) Musculoskeletal: joint swelling (-), joint pain (-), back pain (-) Neuro: weakness (-), numbness (-), headache (-), confusion (-) Skin: Rash (-), lesions (-), dryness (-) Psych: depression (-), suicidal/homicidal ideation (-), feeling of hopelessness (-)  PHYSICAL EXAMINATION:  BP 116/77  Pulse 63  Temp(Src) 98.5 F (36.9 C) (Oral)  Resp 20  Ht 5\' 11"  (1.803 m)  Wt 160 lb 1.6 oz (72.621 kg)   BMI 22.34 kg/m2 General: Patient is a well appearing male in no acute distress HEENT: PERRLA, sclerae anicteric no conjunctival pallor, MMM Neck: supple, no palpable adenopathy Lungs: clear to auscultation bilaterally, no wheezes, rhonchi, or rales Cardiovascular: regular rate rhythm, S1, S2, no murmurs, rubs or gallops Abdomen: Soft, non-tender, non-distended, normoactive bowel sounds, no HSM Extremities: warm and well perfused, no clubbing, cyanosis, or edema Skin: No rashes or lesions Neuro: CN II-XII intact.  Normal gait, 5/5 strength in all extremities.   ECOG PERFORMANCE STATUS: 0 - Asymptomatic    LABORATORY DATA: Lab Results  Component Value Date   WBC 4.0 03/01/2013   HGB 14.8 03/01/2013   HCT 42.5 03/01/2013   MCV 99.3* 03/01/2013   PLT 178 03/01/2013      Chemistry      Component Value Date/Time   NA 141 02/15/2013 1008   NA 138 03/02/2012 1100   K 4.4 02/15/2013 1008   K 4.5 03/02/2012 1100   CL 109* 01/19/2013 1102   CL 102 03/02/2012 1100   CO2 28 02/15/2013 1008   CO2 23 03/02/2012 1100   BUN 14.8 02/15/2013 1008   BUN 25* 03/02/2012 1100   CREATININE 1.0 02/15/2013 1008   CREATININE 1.05 03/02/2012 1100      Component Value Date/Time   CALCIUM 9.6 02/15/2013 1008   CALCIUM 9.5 03/02/2012 1100   ALKPHOS 114 02/01/2013 1059   ALKPHOS 130* 03/02/2012 1100   AST 38* 02/01/2013 1059   AST 39* 03/02/2012 1100   ALT 49 02/01/2013 1059   ALT 65* 03/02/2012 1100   BILITOT 0.44 02/01/2013 1059   BILITOT 0.4 03/02/2012 1100       RADIOGRAPHIC STUDIES:  No results found.  ASSESSMENT: 62 year old gentleman with:  1.  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 Overall he is doing well and tolerating his therapy quite well. He also is getting  Avastin and tolerating it very well.  2.  For his pulmonary embolus he is on Coumadin and he is  therapeutic with his INR.   #3 Hypertension--controlled on Lisinopril and  HCTZ  PLAN:  #1 Patient will proceed with scheduled Avastin today.  I reviewed his lab work with him in detail.    #2 we will continue anti-hypertensive regimen and monitor his BP closely.    #3 His INR is 1.8, he will take Coumadin 10mg  4 nights per week, and 12.5 mg 3 nights per week.  We will re-check his INR on Monday, 03/08/13.  #4 He will return on 8/4 for an INR check and 8/11 for his next lab/appt/avastin infusion.   All questions were answered. The patient knows to call the clinic with any problems, questions or concerns. We can certainly see the patient much sooner if necessary.  I  spent >25 minutes counseling the patient face to face. The total time spent in the appointment was 30 minutes.   Suzan Garibaldi Sheppard Coil, [place] Phone: [phone] 03/01/2013, 10:10 AM

## 2013-03-08 ENCOUNTER — Other Ambulatory Visit (HOSPITAL_BASED_OUTPATIENT_CLINIC_OR_DEPARTMENT_OTHER): Payer: 59

## 2013-03-08 DIAGNOSIS — I2699 Other pulmonary embolism without acute cor pulmonale: Secondary | ICD-10-CM

## 2013-03-08 DIAGNOSIS — C719 Malignant neoplasm of brain, unspecified: Secondary | ICD-10-CM

## 2013-03-08 DIAGNOSIS — I82409 Acute embolism and thrombosis of unspecified deep veins of unspecified lower extremity: Secondary | ICD-10-CM

## 2013-03-08 DIAGNOSIS — C711 Malignant neoplasm of frontal lobe: Secondary | ICD-10-CM

## 2013-03-09 ENCOUNTER — Telehealth: Payer: Self-pay | Admitting: Medical Oncology

## 2013-03-09 NOTE — Telephone Encounter (Signed)
Patient called to verify that he received call from L. Alexander, NP to continue with same dose of coumadin @ 10 mg 4 times per week, and 12.5 mg 3 times per week. Pt knows to call office with any questions or concerns.

## 2013-03-15 ENCOUNTER — Ambulatory Visit (HOSPITAL_BASED_OUTPATIENT_CLINIC_OR_DEPARTMENT_OTHER): Payer: 59 | Admitting: Adult Health

## 2013-03-15 ENCOUNTER — Ambulatory Visit (HOSPITAL_BASED_OUTPATIENT_CLINIC_OR_DEPARTMENT_OTHER): Payer: 59

## 2013-03-15 ENCOUNTER — Encounter: Payer: Self-pay | Admitting: Oncology

## 2013-03-15 ENCOUNTER — Other Ambulatory Visit: Payer: Self-pay | Admitting: *Deleted

## 2013-03-15 ENCOUNTER — Telehealth: Payer: Self-pay | Admitting: Oncology

## 2013-03-15 ENCOUNTER — Encounter: Payer: Self-pay | Admitting: Adult Health

## 2013-03-15 ENCOUNTER — Other Ambulatory Visit (HOSPITAL_BASED_OUTPATIENT_CLINIC_OR_DEPARTMENT_OTHER): Payer: 59 | Admitting: Lab

## 2013-03-15 ENCOUNTER — Telehealth: Payer: Self-pay | Admitting: *Deleted

## 2013-03-15 VITALS — BP 131/87 | HR 62 | Temp 96.9°F | Resp 20 | Ht 71.0 in | Wt 165.9 lb

## 2013-03-15 DIAGNOSIS — Z7901 Long term (current) use of anticoagulants: Secondary | ICD-10-CM

## 2013-03-15 DIAGNOSIS — I10 Essential (primary) hypertension: Secondary | ICD-10-CM

## 2013-03-15 DIAGNOSIS — I2699 Other pulmonary embolism without acute cor pulmonale: Secondary | ICD-10-CM

## 2013-03-15 DIAGNOSIS — C711 Malignant neoplasm of frontal lobe: Secondary | ICD-10-CM

## 2013-03-15 DIAGNOSIS — Z5112 Encounter for antineoplastic immunotherapy: Secondary | ICD-10-CM

## 2013-03-15 DIAGNOSIS — I82409 Acute embolism and thrombosis of unspecified deep veins of unspecified lower extremity: Secondary | ICD-10-CM

## 2013-03-15 DIAGNOSIS — C719 Malignant neoplasm of brain, unspecified: Secondary | ICD-10-CM

## 2013-03-15 LAB — BASIC METABOLIC PANEL (CC13)
CO2: 26 mEq/L (ref 22–29)
Calcium: 8.9 mg/dL (ref 8.4–10.4)
Chloride: 108 mEq/L (ref 98–109)
Glucose: 99 mg/dl (ref 70–140)
Sodium: 142 mEq/L (ref 136–145)

## 2013-03-15 LAB — PROTIME-INR

## 2013-03-15 LAB — CBC WITH DIFFERENTIAL/PLATELET
Eosinophils Absolute: 0.2 10*3/uL (ref 0.0–0.5)
HCT: 40.7 % (ref 38.4–49.9)
LYMPH%: 23.8 % (ref 14.0–49.0)
MONO#: 0.3 10*3/uL (ref 0.1–0.9)
NEUT#: 2.3 10*3/uL (ref 1.5–6.5)
NEUT%: 62.4 % (ref 39.0–75.0)
Platelets: 123 10*3/uL — ABNORMAL LOW (ref 140–400)
RBC: 4.07 10*6/uL — ABNORMAL LOW (ref 4.20–5.82)
WBC: 3.7 10*3/uL — ABNORMAL LOW (ref 4.0–10.3)
lymph#: 0.9 10*3/uL (ref 0.9–3.3)

## 2013-03-15 MED ORDER — HEPARIN SOD (PORK) LOCK FLUSH 100 UNIT/ML IV SOLN
500.0000 [IU] | Freq: Once | INTRAVENOUS | Status: AC | PRN
  Administered 2013-03-15: 500 [IU]
  Filled 2013-03-15: qty 5

## 2013-03-15 MED ORDER — SODIUM CHLORIDE 0.9 % IV SOLN
10.0000 mg/kg | Freq: Once | INTRAVENOUS | Status: AC
  Administered 2013-03-15: 750 mg via INTRAVENOUS
  Filled 2013-03-15: qty 30

## 2013-03-15 MED ORDER — SODIUM CHLORIDE 0.9 % IJ SOLN
10.0000 mL | INTRAMUSCULAR | Status: DC | PRN
  Administered 2013-03-15: 10 mL
  Filled 2013-03-15: qty 10

## 2013-03-15 MED ORDER — SODIUM CHLORIDE 0.9 % IV SOLN
Freq: Once | INTRAVENOUS | Status: AC
  Administered 2013-03-15: 10:00:00 via INTRAVENOUS

## 2013-03-15 NOTE — Progress Notes (Signed)
OFFICE PROGRESS NOTE  Dr. 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 The tumor chemotherapy consisted of Temodar 75 mg per meter squared.  #2 patient was then started on Temodar given days 1 through 5 every 28 days with concomitant Avastin given every 2 weeks.  #3 patient also developed lower extremity DVT as well as as pulmonary embolism he was started on Coumadin to try to maintain an INR between 2 and 2.5.  CURRENT THERAPY: Patient is here for his scheduled Avastin, Temodar 03/15/13-03/19/13.    INTERVAL HISTORY: Roger Raymond 62 y.o. male returns for followup visit today for his treatment. He is doing well today. He remains on Coumadin 10mg  4 times per week and 12.5mg  3 times per week.  He denies headaches, weakness, numbness, fevers, chills, urination changes, easy bruising, easy bleeding, or any other concerns.  A 10 point ROS is negative.    MEDICAL HISTORY: Past Medical History  Diagnosis Date  . 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 DVT (deep venous thrombosis) 07/08/2011    ALLERGIES:  has No Known Allergies.  MEDICATIONS:  Current Outpatient Prescriptions  Medication Sig Dispense Refill  . acetaminophen (TYLENOL) 325 MG tablet Take 650 mg by mouth every 6 (six) hours as needed.        . chlorproMAZINE (THORAZINE) 25 MG tablet Take 25 mg by mouth every 6 (six) hours as needed.        . diphenoxylate-atropine (LOMOTIL) 2.5-0.025 MG per tablet Take 1 tablet by mouth 4 (four) times daily as needed. 1-2 tabs prn       . enoxaparin (LOVENOX) 120 MG/0.8ML injection Inject 0.74 mLs (110 mg total) into the skin daily.   20 Syringe  0  . hydrochlorothiazide (HYDRODIURIL) 25 MG tablet       . levETIRAcetam (KEPPRA) 500 MG tablet Take 500 mg by mouth 2 (two) times daily.        [name] lidocaine-prilocaine (EMLA) cream Apply topically as needed. Apply to port 1-2 hours before procedure  30 g  2  . lisinopril (PRINIVIL,ZESTRIL) 10 MG tablet Take 1 tablet (10 mg total) by mouth daily.  30 tablet  0  . ondansetron (ZOFRAN) 8 MG tablet Take 1 tablet (8 mg total) by mouth every 12 (twelve) hours as needed.  234 tablet  7  . sodium chloride (OCEAN) 0.65 % nasal spray Place 1 spray into the nose as needed.      . temozolomide (TEMODAR) 140 MG capsule Take 2 capsules daily x 5 days then repeat every 28 days  10 capsule  10  . warfarin (COUMADIN) 10 MG tablet Take 1 tablet (10 mg total) by mouth daily.  60 tablet  7  . warfarin (COUMADIN) 5 MG tablet Take 1 tablet (5 mg total) by mouth daily. Or as directed by physician  64 tablet  0   No current facility-administered medications for this visit.   Facility-Administered Medications Ordered in Other Visits  Medication Dose Route Frequency Provider Last Rate Last Dose  . heparin lock flush 100 unit/mL  500 Units Intracatheter Once PRN Oneida Alar, NP      . sodium  chloride 0.9 % injection 10 mL  10 mL Intracatheter PRN Oneida Alar, NP        SURGICAL HISTORY:  Past Surgical History  Procedure Laterality Date  . Vasectomy      REVIEW OF SYSTEMS:  General: fatigue (-), night sweats (-), fever (-), pain (-) Lymph: palpable nodes (-) HEENT: vision changes (-), mucositis (-), gum bleeding (-), epistaxis (-) Cardiovascular: chest pain (-), palpitations (-) Pulmonary: shortness of breath (-), dyspnea on exertion (-), cough (-), hemoptysis (-) GI:  Early satiety (-), melena (-), dysphagia (-), nausea/vomiting (-), diarrhea (-) GU: dysuria (-), hematuria (-), incontinence (-) Musculoskeletal: joint swelling (-), joint pain (-), back pain (-) Neuro: weakness (-),  numbness (-), headache (-), confusion (-) Skin: Rash (-), lesions (-), dryness (-) Psych: depression (-), suicidal/homicidal ideation (-), feeling of hopelessness (-)  PHYSICAL EXAMINATION:  BP 131/87  Pulse 62  Temp(Src) 96.9 F (36.1 C) (Oral)  Resp 20  Ht 5\' 11"  (1.803 m)  Wt 165 lb 14.4 oz (75.252 kg)  BMI 23.15 kg/m2 General: Patient is a well appearing male in no acute distress HEENT: PERRLA, sclerae anicteric no conjunctival pallor, MMM Neck: supple, no palpable adenopathy Lungs: clear to auscultation bilaterally, no wheezes, rhonchi, or rales Cardiovascular: regular rate rhythm, S1, S2, no murmurs, rubs or gallops Abdomen: Soft, non-tender, non-distended, normoactive bowel sounds, no HSM Extremities: warm and well perfused, no clubbing, cyanosis, or edema Skin: No rashes or lesions Neuro: CN II-XII intact.  Normal gait, 5/5 strength in all extremities.   ECOG PERFORMANCE STATUS: 0 - Asymptomatic    LABORATORY DATA: Lab Results  Component Value Date   WBC 3.7* 03/15/2013   HGB 14.0 03/15/2013   HCT 40.7 03/15/2013   MCV 100.0* 03/15/2013   PLT 123* 03/15/2013      Chemistry      Component Value Date/Time   NA 142 03/15/2013 0844   NA 138 03/02/2012 1100   K 4.4 03/15/2013 0844   K 4.5 03/02/2012 1100   CL 109* 01/19/2013 1102   CL 102 03/02/2012 1100   CO2 26 03/15/2013 0844   CO2 23 03/02/2012 1100   BUN 12.1 03/15/2013 0844   BUN 25* 03/02/2012 1100   CREATININE 0.8 03/15/2013 0844   CREATININE 1.05 03/02/2012 1100      Component Value Date/Time   CALCIUM 8.9 03/15/2013 0844   CALCIUM 9.5 03/02/2012 1100   ALKPHOS 114 02/01/2013 1059   ALKPHOS 130* 03/02/2012 1100   AST 38* 02/01/2013 1059   AST 39* 03/02/2012 1100   ALT 49 02/01/2013 1059   ALT 65* 03/02/2012 1100   BILITOT 0.44 02/01/2013 1059   BILITOT 0.4 03/02/2012 1100       RADIOGRAPHIC STUDIES:  No results found.  ASSESSMENT: 62 year old gentleman with:  1.  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 Overall he is doing well and tolerating his therapy quite well. He also is getting  Avastin and tolerating it very well.  2.  For his pulmonary embolus he is on Coumadin and he is  therapeutic with his INR.   #3 Hypertension--controlled on Lisinopril and HCTZ  PLAN:  #1 Patient will proceed with scheduled Avastin today.  I reviewed his lab work with him in detail.  He will start his Temodar tonight.    #2 we will continue anti-hypertensive regimen and monitor his BP closely.    #3 His INR is 3.  He will take 10mg  five nights per week and 12.5mg  two nights per week.    #4 He will return on 8/18 for an INR check and 8/25 for his next lab/appt/avastin infusion.   All questions were answered. The patient knows to call the clinic with any problems, questions or concerns. We can certainly see the patient much sooner if necessary.  I spent >25 minutes counseling the patient face to face. The total time spent in the appointment was 30 minutes.   Suzan Garibaldi Sheppard Coil, [place] Phone: [phone] 03/15/2013, 10:57 AM

## 2013-03-15 NOTE — Patient Instructions (Signed)
Doing well.  Proceed with treatment.  We will check INR on Monday 8/18.  Take Coumadin 10mg  five nights per week and 12.5mg  two nights per week.  Please call us if you have any questions or concerns.

## 2013-03-15 NOTE — Patient Instructions (Addendum)
[facility] Discharge Instructions for Patients Receiving Chemotherapy  Today you received the following chemotherapy agents Avastin.  To help prevent nausea and vomiting after your treatment, we encourage you to take your nausea medication as prescribed.   If you develop nausea and vomiting that is not controlled by your nausea medication, call the clinic.   BELOW ARE SYMPTOMS THAT SHOULD BE REPORTED IMMEDIATELY:  *FEVER GREATER THAN 100.5 F  *CHILLS WITH OR WITHOUT FEVER  NAUSEA AND VOMITING THAT IS NOT CONTROLLED WITH YOUR NAUSEA MEDICATION  *UNUSUAL SHORTNESS OF BREATH  *UNUSUAL BRUISING OR BLEEDING  TENDERNESS IN MOUTH AND THROAT WITH OR WITHOUT PRESENCE OF ULCERS  *URINARY PROBLEMS  *BOWEL PROBLEMS  UNUSUAL RASH Items with * indicate a potential emergency and should be followed up as soon as possible.  Feel free to call the clinic you have any questions or concerns. The clinic phone number is [phone].    

## 2013-03-15 NOTE — Telephone Encounter (Signed)
Per staff phone call and POF I have schedueld appts.  JMW  

## 2013-03-22 ENCOUNTER — Other Ambulatory Visit (HOSPITAL_BASED_OUTPATIENT_CLINIC_OR_DEPARTMENT_OTHER): Payer: 59

## 2013-03-22 DIAGNOSIS — C719 Malignant neoplasm of brain, unspecified: Secondary | ICD-10-CM

## 2013-03-22 DIAGNOSIS — C711 Malignant neoplasm of frontal lobe: Secondary | ICD-10-CM

## 2013-03-22 DIAGNOSIS — I82409 Acute embolism and thrombosis of unspecified deep veins of unspecified lower extremity: Secondary | ICD-10-CM

## 2013-03-22 LAB — PROTIME-INR: Protime: 42 Seconds — ABNORMAL HIGH (ref 10.6–13.4)

## 2013-03-25 ENCOUNTER — Telehealth: Payer: Self-pay | Admitting: *Deleted

## 2013-03-25 NOTE — Telephone Encounter (Signed)
Reached patient at home. To verify how he is taking the coumadin.  Reports he takes 10 mg daily except on Friday and Sunday nights takes 12.5 mg.  Verbal order received and read back from Henry for patient to take 10 mg daily.  Next f/u with lab is 03-29-2013.  Patient able to verbalized this information.

## 2013-03-29 ENCOUNTER — Encounter: Payer: Self-pay | Admitting: Oncology

## 2013-03-29 ENCOUNTER — Ambulatory Visit (HOSPITAL_BASED_OUTPATIENT_CLINIC_OR_DEPARTMENT_OTHER): Payer: 59

## 2013-03-29 ENCOUNTER — Ambulatory Visit (HOSPITAL_BASED_OUTPATIENT_CLINIC_OR_DEPARTMENT_OTHER): Payer: 59 | Admitting: Adult Health

## 2013-03-29 ENCOUNTER — Telehealth: Payer: Self-pay | Admitting: *Deleted

## 2013-03-29 ENCOUNTER — Encounter: Payer: Self-pay | Admitting: Adult Health

## 2013-03-29 ENCOUNTER — Other Ambulatory Visit (HOSPITAL_BASED_OUTPATIENT_CLINIC_OR_DEPARTMENT_OTHER): Payer: 59

## 2013-03-29 VITALS — BP 123/85 | HR 61 | Temp 97.5°F | Resp 20 | Ht 71.0 in | Wt 160.7 lb

## 2013-03-29 VITALS — BP 123/84 | HR 66 | Temp 96.7°F | Resp 18

## 2013-03-29 DIAGNOSIS — C711 Malignant neoplasm of frontal lobe: Secondary | ICD-10-CM

## 2013-03-29 DIAGNOSIS — Z5111 Encounter for antineoplastic chemotherapy: Secondary | ICD-10-CM

## 2013-03-29 DIAGNOSIS — I10 Essential (primary) hypertension: Secondary | ICD-10-CM

## 2013-03-29 DIAGNOSIS — I82409 Acute embolism and thrombosis of unspecified deep veins of unspecified lower extremity: Secondary | ICD-10-CM

## 2013-03-29 DIAGNOSIS — Z5112 Encounter for antineoplastic immunotherapy: Secondary | ICD-10-CM

## 2013-03-29 DIAGNOSIS — I2699 Other pulmonary embolism without acute cor pulmonale: Secondary | ICD-10-CM

## 2013-03-29 DIAGNOSIS — Z7901 Long term (current) use of anticoagulants: Secondary | ICD-10-CM

## 2013-03-29 DIAGNOSIS — Z86711 Personal history of pulmonary embolism: Secondary | ICD-10-CM

## 2013-03-29 DIAGNOSIS — C719 Malignant neoplasm of brain, unspecified: Secondary | ICD-10-CM

## 2013-03-29 DIAGNOSIS — K219 Gastro-esophageal reflux disease without esophagitis: Secondary | ICD-10-CM

## 2013-03-29 LAB — UA PROTEIN, DIPSTICK - CHCC: Protein, ur: 30 mg/dL

## 2013-03-29 LAB — CBC WITH DIFFERENTIAL/PLATELET
Eosinophils Absolute: 0.2 10*3/uL (ref 0.0–0.5)
LYMPH%: 23.2 % (ref 14.0–49.0)
MONO#: 0.3 10*3/uL (ref 0.1–0.9)
NEUT#: 2.6 10*3/uL (ref 1.5–6.5)
Platelets: 169 10*3/uL (ref 140–400)
RBC: 4.5 10*6/uL (ref 4.20–5.82)
WBC: 4.1 10*3/uL (ref 4.0–10.3)
lymph#: 0.9 10*3/uL (ref 0.9–3.3)
nRBC: 0 % (ref 0–0)

## 2013-03-29 LAB — PROTIME-INR
INR: 2.2 (ref 2.00–3.50)
Protime: 26.4 Seconds — ABNORMAL HIGH (ref 10.6–13.4)

## 2013-03-29 LAB — BASIC METABOLIC PANEL (CC13)
CO2: 25 mEq/L (ref 22–29)
Calcium: 9.3 mg/dL (ref 8.4–10.4)
Creatinine: 0.8 mg/dL (ref 0.7–1.3)
Sodium: 139 mEq/L (ref 136–145)

## 2013-03-29 MED ORDER — PANTOPRAZOLE SODIUM 40 MG PO TBEC: 40.0000 mg | DELAYED_RELEASE_TABLET | Freq: Every day | ORAL | Status: DC

## 2013-03-29 MED ORDER — LISINOPRIL 10 MG PO TABS: 10.0000 mg | ORAL_TABLET | Freq: Every day | ORAL | Status: DC

## 2013-03-29 MED ORDER — TEMOZOLOMIDE 140 MG PO CAPS: ORAL_CAPSULE | ORAL | Status: DC

## 2013-03-29 MED ORDER — SODIUM CHLORIDE 0.9 % IV SOLN
10.0000 mg/kg | Freq: Once | INTRAVENOUS | Status: AC
  Administered 2013-03-29: 750 mg via INTRAVENOUS
  Filled 2013-03-29: qty 30

## 2013-03-29 MED ORDER — SODIUM CHLORIDE 0.9 % IV SOLN
Freq: Once | INTRAVENOUS | Status: AC
  Administered 2013-03-29: 11:00:00 via INTRAVENOUS

## 2013-03-29 MED ORDER — SODIUM CHLORIDE 0.9 % IJ SOLN
10.0000 mL | INTRAMUSCULAR | Status: DC | PRN
  Administered 2013-03-29: 10 mL
  Filled 2013-03-29: qty 10

## 2013-03-29 MED ORDER — HEPARIN SOD (PORK) LOCK FLUSH 100 UNIT/ML IV SOLN
500.0000 [IU] | Freq: Once | INTRAVENOUS | Status: AC | PRN
  Administered 2013-03-29: 500 [IU]
  Filled 2013-03-29: qty 5

## 2013-03-29 NOTE — Progress Notes (Signed)
Discharged at 1225 alone, ambulatory in no distress.

## 2013-03-29 NOTE — Telephone Encounter (Signed)
appts made and printed. Pt is aware that tx will follow after ov on 9/22, 10/6, 10/20, and 11/3. i emailed MW to add tx's...td

## 2013-03-29 NOTE — Telephone Encounter (Signed)
Per staff message and POF I have scheduled appts.  JMW  

## 2013-03-29 NOTE — Patient Instructions (Addendum)
Bevacizumab injection What is this medicine? BEVACIZUMAB (be va SIZ yoo mab) is a chemotherapy drug. It targets a protein found in many cancer cell types, and halts cancer growth. This drug treats many cancers including non-small cell lung cancer, and colon or rectal cancer. It is usually given with other chemotherapy drugs. This medicine may be used for other purposes; ask your health care provider or pharmacist if you have questions. What should I tell my health care provider before I take this medicine? They need to know if you have any of these conditions: -blood clots -heart disease, including heart failure, heart attack, or chest pain (angina) -high blood pressure -infection (especially a virus infection such as chickenpox, cold sores, or herpes) -kidney disease -lung disease -prior chemotherapy with doxorubicin, daunorubicin, epirubicin, or other anthracycline type chemotherapy agents -recent or ongoing radiation therapy -recent surgery -stroke -an unusual or allergic reaction to bevacizumab, hamster proteins, mouse proteins, other medicines, foods, dyes, or preservatives -pregnant or trying to get pregnant -breast-feeding How should I use this medicine? This medicine is for infusion into a vein. It is given by a health care professional in a hospital or clinic setting. Talk to your pediatrician regarding the use of this medicine in children. Special care may be needed. Overdosage: If you think you have taken too much of this medicine contact a poison control center or emergency room at once. NOTE: This medicine is only for you. Do not share this medicine with others. What if I miss a dose? It is important not to miss your dose. Call your doctor or health care professional if you are unable to keep an appointment. What may interact with this medicine? Interactions are not expected. This list may not describe all possible interactions. Give your health care provider a list of all  the medicines, herbs, non-prescription drugs, or dietary supplements you use. Also tell them if you smoke, drink alcohol, or use illegal drugs. Some items may interact with your medicine. What should I watch for while using this medicine? Your condition will be monitored carefully while you are receiving this medicine. You will need important blood work and urine testing done while you are taking this medicine. During your treatment, let your health care professional know if you have any unusual symptoms, such as difficulty breathing. This medicine may rarely cause 'gastrointestinal perforation' (holes in the stomach, intestines or colon), a serious side effect requiring surgery to repair. This medicine should be started at least 28 days following major surgery and the site of the surgery should be totally healed. Check with your doctor before scheduling dental work or surgery while you are receiving this treatment. Talk to your doctor if you have recently had surgery or if you have a wound that has not healed. Do not become pregnant while taking this medicine. Women should inform their doctor if they wish to become pregnant or think they might be pregnant. There is a potential for serious side effects to an unborn child. Talk to your health care professional or pharmacist for more information. Do not breast-feed an infant while taking this medicine. This medicine has caused ovarian failure in some women. This medicine may interfere with the ability to have a child. You should talk to your doctor or health care professional if you are concerned about your fertility. What side effects may I notice from receiving this medicine? Side effects that you should report to your doctor or health care professional as soon as possible: -allergic reactions like skin   rash, itching or hives, swelling of the face, lips, or tongue -signs of infection - fever or chills, cough, sore throat, pain or trouble passing  urine -signs of decreased platelets or bleeding - bruising, pinpoint red spots on the skin, black, tarry stools, nosebleeds, blood in the urine -breathing problems -changes in vision -chest pain -confusion -jaw pain, especially after dental work -mouth sores -seizures -severe abdominal pain -severe headache -sudden numbness or weakness of the face, arm or leg -swelling of legs or ankles -symptoms of a stroke: change in mental awareness, inability to talk or move one side of the body (especially in patients with lung cancer) -trouble passing urine or change in the amount of urine -trouble speaking or understanding -trouble walking, dizziness, loss of balance or coordination Side effects that usually do not require medical attention (report to your doctor or health care professional if they continue or are bothersome): -constipation -diarrhea -dry skin -headache -loss of appetite -nausea, vomiting This list may not describe all possible side effects. Call your doctor for medical advice about side effects. You may report side effects to FDA at 1-800-FDA-1088. Where should I keep my medicine? This drug is given in a hospital or clinic and will not be stored at home. NOTE: This sheet is a summary. It may not cover all possible information. If you have questions about this medicine, talk to your doctor, pharmacist, or health care provider.  2013, [employer]. (06/22/2010 4:25:37 PM)  

## 2013-03-29 NOTE — Progress Notes (Signed)
OFFICE PROGRESS NOTE  Dr. 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  PRIOR THERAPY:  #1 patient underwent a resection of the brain tumor followed by concurrent chemotherapy and radiation therapy. The tumor chemotherapy consisted of Temodar 75 mg per meter squared.  #2 patient was then started on Temodar 150mg /meter squared given days 1 through 5 every 28 days with concomitant Avastin given every 2 weeks.  #3 patient also developed lower extremity DVT as well as as pulmonary embolism he was started on Coumadin to try to maintain an INR between 2 and 2.5.  CURRENT THERAPY: Patient is here for his scheduled Avastin, received Temodar 03/15/13-03/19/13.    INTERVAL HISTORY: Roger Raymond 62 y.o. male returns for followup visit today for his treatment. He is c/o indigestion today.  He has tried to manage it with dietary restrictions, however it is worse regardless.  His wife has notice that he nods his head very subtly back and forth without realizing it on occasion.   He is going to [place] September 8- September 20.  He would like to stay on Coumadin during this trip.  Otherwise, a 10 point ROS is negative.      MEDICAL HISTORY: Past Medical History  Diagnosis Date  . 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 DVT (deep venous thrombosis) 07/08/2011    ALLERGIES:  has No Known Allergies.  MEDICATIONS:  Current Outpatient Prescriptions  Medication Sig Dispense Refill  . acetaminophen (TYLENOL) 325 MG tablet Take 650 mg by mouth every 6 (six) hours as needed.        . chlorproMAZINE (THORAZINE) 25 MG tablet Take 25 mg by mouth every 6 (six) hours as needed.        . diphenoxylate-atropine (LOMOTIL) 2.5-0.025 MG per tablet Take 1 tablet by mouth 4 (four) times  daily as needed. 1-2 tabs prn       . enoxaparin (LOVENOX) 120 MG/0.8ML injection Inject 0.74 mLs (110 mg total) into the skin daily.  20 Syringe  0  . hydrochlorothiazide (HYDRODIURIL) 25 MG tablet       . levETIRAcetam (KEPPRA) 500 MG tablet Take 500 mg by mouth 2 (two) times daily.        [name] lidocaine-prilocaine (EMLA) cream Apply topically as needed. Apply to port 1-2 hours before procedure  30 g  2  . lisinopril (PRINIVIL,ZESTRIL) 10 MG tablet Take 1 tablet (10 mg total) by mouth daily.  30 tablet  0  . ondansetron (ZOFRAN) 8 MG tablet Take 1 tablet (8 mg total) by mouth every 12 (twelve) hours as needed.  234 tablet  7  . sodium chloride (OCEAN) 0.65 % nasal spray Place 1 spray into the nose as needed.      . temozolomide (TEMODAR) 140 MG capsule Take 2 capsules daily x 5 days then repeat every 28 days  10 capsule  10  . warfarin (COUMADIN) 10 MG tablet Take 1 tablet (10 mg total) by mouth daily.  60 tablet  7  . warfarin (COUMADIN) 5 MG tablet Take 1 tablet (5 mg total) by mouth daily. Or as directed by physician  64 tablet  0  . pantoprazole (PROTONIX) 40 MG tablet Take 1 tablet (40 mg total) by mouth daily.  30 tablet  0   No current facility-administered medications for this visit.    SURGICAL HISTORY:  Past Surgical History  Procedure Laterality Date  . Vasectomy      REVIEW OF SYSTEMS:  General: fatigue (-), night sweats (-), fever (-), pain (-) Lymph: palpable nodes (-) HEENT: vision changes (-), mucositis (-), gum bleeding (-), epistaxis (-) Cardiovascular: chest pain (-), palpitations (-) Pulmonary: shortness of breath (-), dyspnea on exertion (-), cough (-), hemoptysis (-) GI:  Early satiety (-), melena (-), dysphagia (-), nausea/vomiting (-), diarrhea (-) GU: dysuria (-), hematuria (-), incontinence (-) Musculoskeletal: joint swelling (-), joint pain (-), back pain (-) Neuro: weakness (-), numbness (-), headache (-), confusion (-) Skin: Rash (-), lesions (-), dryness  (-) Psych: depression (-), suicidal/homicidal ideation (-), feeling of hopelessness (-)  PHYSICAL EXAMINATION:  BP 123/85  Pulse 61  Temp(Src) 97.5 F (36.4 C) (Oral)  Resp 20  Ht 5\' 11"  (1.803 m)  Wt 160 lb 11.2 oz (72.893 kg)  BMI 22.42 kg/m2 General: Patient is a well appearing male in no acute distress HEENT: PERRLA, sclerae anicteric no conjunctival pallor, MMM Neck: supple, no palpable adenopathy Lungs: clear to auscultation bilaterally, no wheezes, rhonchi, or rales Cardiovascular: regular rate rhythm, S1, S2, no murmurs, rubs or gallops Abdomen: Soft, non-tender, non-distended, normoactive bowel sounds, no HSM Extremities: warm and well perfused, no clubbing, cyanosis, or edema Skin: No rashes or lesions Neuro: CN II-XII intact.  Normal gait, 5/5 strength in all extremities.   ECOG PERFORMANCE STATUS: 0 - Asymptomatic    LABORATORY DATA: Lab Results  Component Value Date   WBC 4.1 03/29/2013   HGB 15.4 03/29/2013   HCT 44.8 03/29/2013   MCV 99.6* 03/29/2013   PLT 169 03/29/2013      Chemistry      Component Value Date/Time   NA 142 03/15/2013 0844   NA 138 03/02/2012 1100   K 4.4 03/15/2013 0844   K 4.5 03/02/2012 1100   CL 109* 01/19/2013 1102   CL 102 03/02/2012 1100   CO2 26 03/15/2013 0844   CO2 23 03/02/2012 1100   BUN 12.1 03/15/2013 0844   BUN 25* 03/02/2012 1100   CREATININE 0.8 03/15/2013 0844   CREATININE 1.05 03/02/2012 1100      Component Value Date/Time   CALCIUM 8.9 03/15/2013 0844   CALCIUM 9.5 03/02/2012 1100   ALKPHOS 114 02/01/2013 1059   ALKPHOS 130* 03/02/2012 1100   AST 38* 02/01/2013 1059   AST 39* 03/02/2012 1100   ALT 49 02/01/2013 1059   ALT 65* 03/02/2012 1100   BILITOT 0.44 02/01/2013 1059   BILITOT 0.4 03/02/2012 1100       RADIOGRAPHIC STUDIES:  No results found.  ASSESSMENT: 62 year old gentleman with:  1.  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 Overall he is doing well and  tolerating his therapy quite well. He also is getting  Avastin and tolerating it very well.  2.  For his pulmonary embolus he is on Coumadin and he is  therapeutic with his INR.   #3 Hypertension--controlled on Lisinopril and HCTZ  PLAN:  #1 Patient will proceed with scheduled Avastin today.  I reviewed his lab work with him in detail.   He will take Temodar after he returns from his trip and start it on 9/22.    #2 we will continue anti-hypertensive regimen and monitor his BP closely.    #3 His INR is  2.2. He will continue Coumadin 10mg  daily.    #4 He will return on 9/2 for an INR check and 9/8 for his next lab/appt/avastin infusion.   #5 I prescribed Pantoprazole for his indigestion.    #6 In regards to his head nodding involuntarily, he recently had an MRI with Dr. Sherwood Gambler in June that was good.  He and his wife will monitor it.  Should it worsen, they will call Dr. Donnella Bi office for further evaluation.    All questions were answered. The patient knows to call the clinic with any problems, questions or concerns. We can certainly see the patient much sooner if necessary.  I spent >25 minutes counseling the patient face to face. The total time spent in the appointment was 30 minutes.   This case was discussed with Dr. Humphrey Rolls.   Suzan Garibaldi Sheppard Coil, [place] Phone: [phone] 03/29/2013, 10:23 AM

## 2013-03-29 NOTE — Progress Notes (Signed)
Avastin has infused, vitals checked.  Vitals wnl post avastin infusion

## 2013-03-29 NOTE — Patient Instructions (Signed)
Pantoprazole tablets What is this medicine? PANTOPRAZOLE (pan TOE pra zole) prevents the production of acid in the stomach. It is used to treat gastroesophageal reflux disease (GERD), inflammation of the esophagus, and Zollinger-Ellison syndrome. This medicine may be used for other purposes; ask your health care provider or pharmacist if you have questions. What should I tell my health care provider before I take this medicine? They need to know if you have any of these conditions: -liver disease -low levels of magnesium in the blood -an unusual or allergic reaction to omeprazole, lansoprazole, pantoprazole, rabeprazole, other medicines, foods, dyes, or preservatives -pregnant or trying to get pregnant -breast-feeding How should I use this medicine? Take this medicine by mouth. Swallow the tablets whole with a drink of water. Follow the directions on the prescription label. Do not crush, break, or chew. Take your medicine at regular intervals. Do not take your medicine more often than directed. Talk to your pediatrician regarding the use of this medicine in children. While this drug may be prescribed for children as young as 5 years for selected conditions, precautions do apply. Overdosage: If you think you have taken too much of this medicine contact a poison control center or emergency room at once. NOTE: This medicine is only for you. Do not share this medicine with others. What if I miss a dose? If you miss a dose, take it as soon as you can. If it is almost time for your next dose, take only that dose. Do not take double or extra doses. What may interact with this medicine? Do not take this medicine with any of the following medications: -atazanavir -nelfinavir This medicine may also interact with the following medications: -ampicillin -delavirdine -digoxin -diuretics -iron salts -medicines for fungal infections like ketoconazole, itraconazole and voriconazole -warfarin This list  may not describe all possible interactions. Give your health care provider a list of all the medicines, herbs, non-prescription drugs, or dietary supplements you use. Also tell them if you smoke, drink alcohol, or use illegal drugs. Some items may interact with your medicine. What should I watch for while using this medicine? It can take several days before your stomach pain gets better. Check with your doctor or health care professional if your condition does not start to get better, or if it gets worse. You may need blood work done while you are taking this medicine. What side effects may I notice from receiving this medicine? Side effects that you should report to your doctor or health care professional as soon as possible: -allergic reactions like skin rash, itching or hives, swelling of the face, lips, or tongue -bone, muscle or joint pain -breathing problems -chest pain or chest tightness -dark yellow or brown urine -dizziness -fast, irregular heartbeat -feeling faint or lightheaded -fever or sore throat -muscle spasm -palpitations -redness, blistering, peeling or loosening of the skin, including inside the mouth -seizures -tremors -unusual bleeding or bruising -unusually weak or tired -yellowing of the eyes or skin Side effects that usually do not require medical attention (Report these to your doctor or health care professional if they continue or are bothersome.): -constipation -diarrhea -dry mouth -headache -nausea This list may not describe all possible side effects. Call your doctor for medical advice about side effects. You may report side effects to FDA at 1-800-FDA-1088. Where should I keep my medicine? Keep out of the reach of children. Store at room temperature between 15 and 30 degrees C (59 and 86 degrees F). Protect from light and moisture.  Throw away any unused medicine after the expiration date. NOTE: This sheet is a summary. It may not cover all possible  information. If you have questions about this medicine, talk to your doctor, pharmacist, or health care provider.  2012, [employer]. (10/10/2009 12:03:53 PM)

## 2013-04-02 ENCOUNTER — Other Ambulatory Visit: Payer: Self-pay | Admitting: *Deleted

## 2013-04-02 DIAGNOSIS — I10 Essential (primary) hypertension: Secondary | ICD-10-CM

## 2013-04-02 MED ORDER — LISINOPRIL 10 MG PO TABS: 10.0000 mg | ORAL_TABLET | Freq: Every day | ORAL | Status: DC

## 2013-04-06 ENCOUNTER — Other Ambulatory Visit: Payer: 59 | Admitting: Lab

## 2013-04-06 ENCOUNTER — Telehealth: Payer: Self-pay

## 2013-04-06 ENCOUNTER — Other Ambulatory Visit (HOSPITAL_BASED_OUTPATIENT_CLINIC_OR_DEPARTMENT_OTHER): Payer: 59

## 2013-04-06 DIAGNOSIS — I82409 Acute embolism and thrombosis of unspecified deep veins of unspecified lower extremity: Secondary | ICD-10-CM

## 2013-04-06 DIAGNOSIS — C711 Malignant neoplasm of frontal lobe: Secondary | ICD-10-CM

## 2013-04-06 NOTE — Telephone Encounter (Signed)
Message copied by Cy Blamer on Tue Apr 06, 2013  4:50 PM ------      Message from: Laural Golden      Created: Tue Apr 06, 2013  2:52 PM       INR good.  Patient should continue current dose.              Thanks, L      ----- Message -----         From: [facility] Interface         Sent: 04/06/2013  11:47 AM           To: Victorino December, MD                   ------

## 2013-04-06 NOTE — Telephone Encounter (Signed)
Spoke with pt regarding lab results for 9/2. Per LA, INR is stable and he is advised to remain on current dose of Coumadin. Pt voiced understanding and knows to call the office with any questions. TMB

## 2013-04-12 ENCOUNTER — Encounter: Payer: Self-pay | Admitting: Adult Health

## 2013-04-12 ENCOUNTER — Ambulatory Visit (HOSPITAL_BASED_OUTPATIENT_CLINIC_OR_DEPARTMENT_OTHER): Payer: 59

## 2013-04-12 ENCOUNTER — Telehealth: Payer: Self-pay | Admitting: *Deleted

## 2013-04-12 ENCOUNTER — Ambulatory Visit (HOSPITAL_BASED_OUTPATIENT_CLINIC_OR_DEPARTMENT_OTHER): Payer: 59 | Admitting: Adult Health

## 2013-04-12 ENCOUNTER — Other Ambulatory Visit (HOSPITAL_BASED_OUTPATIENT_CLINIC_OR_DEPARTMENT_OTHER): Payer: 59 | Admitting: Lab

## 2013-04-12 ENCOUNTER — Other Ambulatory Visit: Payer: 59 | Admitting: Lab

## 2013-04-12 ENCOUNTER — Encounter: Payer: Self-pay | Admitting: Oncology

## 2013-04-12 ENCOUNTER — Telehealth: Payer: Self-pay | Admitting: Oncology

## 2013-04-12 VITALS — BP 143/88

## 2013-04-12 VITALS — BP 142/96 | HR 56 | Temp 98.0°F | Resp 20 | Ht 71.0 in | Wt 162.2 lb

## 2013-04-12 DIAGNOSIS — C711 Malignant neoplasm of frontal lobe: Secondary | ICD-10-CM

## 2013-04-12 DIAGNOSIS — Z5111 Encounter for antineoplastic chemotherapy: Secondary | ICD-10-CM

## 2013-04-12 DIAGNOSIS — C719 Malignant neoplasm of brain, unspecified: Secondary | ICD-10-CM

## 2013-04-12 DIAGNOSIS — I10 Essential (primary) hypertension: Secondary | ICD-10-CM

## 2013-04-12 DIAGNOSIS — I82409 Acute embolism and thrombosis of unspecified deep veins of unspecified lower extremity: Secondary | ICD-10-CM

## 2013-04-12 DIAGNOSIS — Z5112 Encounter for antineoplastic immunotherapy: Secondary | ICD-10-CM

## 2013-04-12 DIAGNOSIS — I2699 Other pulmonary embolism without acute cor pulmonale: Secondary | ICD-10-CM

## 2013-04-12 LAB — COMPREHENSIVE METABOLIC PANEL (CC13)
Alkaline Phosphatase: 89 U/L (ref 40–150)
BUN: 14 mg/dL (ref 7.0–26.0)
Creatinine: 0.8 mg/dL (ref 0.7–1.3)
Glucose: 96 mg/dl (ref 70–140)
Sodium: 141 mEq/L (ref 136–145)
Total Bilirubin: 0.45 mg/dL (ref 0.20–1.20)
Total Protein: 5.9 g/dL — ABNORMAL LOW (ref 6.4–8.3)

## 2013-04-12 LAB — CBC WITH DIFFERENTIAL/PLATELET
Basophils Absolute: 0 10*3/uL (ref 0.0–0.1)
EOS%: 3.3 % (ref 0.0–7.0)
HCT: 40.6 % (ref 38.4–49.9)
HGB: 13.9 g/dL (ref 13.0–17.1)
LYMPH%: 25 % (ref 14.0–49.0)
MCH: 33.9 pg — ABNORMAL HIGH (ref 27.2–33.4)
MCV: 99 fL — ABNORMAL HIGH (ref 79.3–98.0)
MONO%: 10.4 % (ref 0.0–14.0)
NEUT%: 60.5 % (ref 39.0–75.0)

## 2013-04-12 MED ORDER — SODIUM CHLORIDE 0.9 % IV SOLN: Freq: Once | INTRAVENOUS | Status: DC

## 2013-04-12 MED ORDER — SODIUM CHLORIDE 0.9 % IV SOLN
10.0000 mg/kg | Freq: Once | INTRAVENOUS | Status: AC
  Administered 2013-04-12: 750 mg via INTRAVENOUS
  Filled 2013-04-12: qty 30

## 2013-04-12 MED ORDER — HEPARIN SOD (PORK) LOCK FLUSH 100 UNIT/ML IV SOLN
500.0000 [IU] | Freq: Once | INTRAVENOUS | Status: AC | PRN
  Administered 2013-04-12: 500 [IU]
  Filled 2013-04-12: qty 5

## 2013-04-12 MED ORDER — SODIUM CHLORIDE 0.9 % IJ SOLN
10.0000 mL | INTRAMUSCULAR | Status: DC | PRN
  Administered 2013-04-12: 10 mL
  Filled 2013-04-12: qty 10

## 2013-04-12 NOTE — Patient Instructions (Addendum)
[facility] Discharge Instructions for Patients Receiving Chemotherapy  Today you received the following chemotherapy agent: Avastin  To help prevent nausea and vomiting after your treatment, we encourage you to take your nausea medication -  Zofran 8 mg every 12 hours as needed   If you develop nausea and vomiting that is not controlled by your nausea medication, call the clinic.   BELOW ARE SYMPTOMS THAT SHOULD BE REPORTED IMMEDIATELY:  *FEVER GREATER THAN 100.5 F  *CHILLS WITH OR WITHOUT FEVER  NAUSEA AND VOMITING THAT IS NOT CONTROLLED WITH YOUR NAUSEA MEDICATION  *UNUSUAL SHORTNESS OF BREATH  *UNUSUAL BRUISING OR BLEEDING  TENDERNESS IN MOUTH AND THROAT WITH OR WITHOUT PRESENCE OF ULCERS  *URINARY PROBLEMS  *BOWEL PROBLEMS  UNUSUAL RASH Items with * indicate a potential emergency and should be followed up as soon as possible.  Feel free to call the clinic you have any questions or concerns. The clinic phone number is [phone].  It has been a pleasure to serve you today!

## 2013-04-12 NOTE — Telephone Encounter (Signed)
Per staff message and POF I have scheduled appts.  JMW  

## 2013-04-12 NOTE — Progress Notes (Signed)
OK to treat today with urine protein 100 per Lester Slayton.  BP recheck improved at 146/86 pre Avastin

## 2013-04-12 NOTE — Progress Notes (Signed)
OFFICE PROGRESS NOTE  Dr. 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  PRIOR THERAPY:  #1 patient underwent a resection of the brain tumor followed by concurrent chemotherapy and radiation therapy. The tumor chemotherapy consisted of Temodar 75 mg per meter squared.  #2 patient was then started on Temodar 150mg /meter squared given days 1 through 5 every 28 days with concomitant Avastin given every 2 weeks.  #3 patient also developed lower extremity DVT as well as as pulmonary embolism he was started on Coumadin to try to maintain an INR between 2 and 2.5.  CURRENT THERAPY: Patient is here for his scheduled Avastin, received Temodar 03/15/13-03/19/13.    INTERVAL HISTORY: Roger Raymond 62 y.o. male returns for followup visit today for his treatment. His indigestion is much improved today.  He is doing well.  His INR is 2.5.  His coumadin dose is 10mg  daily.  He is preparing for a trip to [place].  He denies any headaches, numbness, weakness, vision changes, easy bruising/bleeding or any further concerns.        MEDICAL HISTORY: Past Medical History  Diagnosis Date  . 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 DVT (deep venous thrombosis) 07/08/2011    ALLERGIES:  has No Known Allergies.  MEDICATIONS:  Current Outpatient Prescriptions  Medication Sig Dispense Refill  . acetaminophen (TYLENOL) 325 MG tablet Take 650 mg by mouth every 6 (six) hours as needed.        . chlorproMAZINE (THORAZINE) 25 MG tablet Take 25 mg by mouth every 6 (six) hours as needed.        . diphenoxylate-atropine (LOMOTIL) 2.5-0.025 MG per tablet Take 1 tablet by mouth 4 (four) times daily as needed. 1-2 tabs prn       . enoxaparin (LOVENOX) 120 MG/0.8ML injection Inject 0.74 mLs  (110 mg total) into the skin daily.  20 Syringe  0  . hydrochlorothiazide (HYDRODIURIL) 25 MG tablet       . levETIRAcetam (KEPPRA) 500 MG tablet Take 500 mg by mouth 2 (two) times daily.        [name] lidocaine-prilocaine (EMLA) cream Apply topically as needed. Apply to port 1-2 hours before procedure  30 g  2  . lisinopril (PRINIVIL,ZESTRIL) 10 MG tablet Take 1 tablet (10 mg total) by mouth daily.  90 tablet  0  . ondansetron (ZOFRAN) 8 MG tablet Take 1 tablet (8 mg total) by mouth every 12 (twelve) hours as needed.  234 tablet  7  . pantoprazole (PROTONIX) 40 MG tablet Take 1 tablet (40 mg total) by mouth daily.  30 tablet  0  . sodium chloride (OCEAN) 0.65 % nasal spray Place 1 spray into the nose as needed.      . temozolomide (TEMODAR) 140 MG capsule Take 2 capsules daily x 5 days then repeat every 28 days  10 capsule  10  . warfarin (COUMADIN) 10 MG tablet Take 1 tablet (10 mg total) by mouth daily.  60 tablet  7  . warfarin (COUMADIN) 5 MG tablet Take 1 tablet (5 mg total) by mouth daily. Or as directed by physician  64 tablet  0   No current facility-administered medications for this visit.    SURGICAL HISTORY:  Past Surgical History  Procedure  Laterality Date  . Vasectomy      REVIEW OF SYSTEMS:  General: fatigue (-), night sweats (-), fever (-), pain (-) Lymph: palpable nodes (-) HEENT: vision changes (-), mucositis (-), gum bleeding (-), epistaxis (-) Cardiovascular: chest pain (-), palpitations (-) Pulmonary: shortness of breath (-), dyspnea on exertion (-), cough (-), hemoptysis (-) GI:  Early satiety (-), melena (-), dysphagia (-), nausea/vomiting (-), diarrhea (-) GU: dysuria (-), hematuria (-), incontinence (-) Musculoskeletal: joint swelling (-), joint pain (-), back pain (-) Neuro: weakness (-), numbness (-), headache (-), confusion (-) Skin: Rash (-), lesions (-), dryness (-) Psych: depression (-), suicidal/homicidal ideation (-), feeling of hopelessness (-)  PHYSICAL  EXAMINATION:  There were no vitals taken for this visit. General: Patient is a well appearing male in no acute distress HEENT: PERRLA, sclerae anicteric no conjunctival pallor, MMM Neck: supple, no palpable adenopathy Lungs: clear to auscultation bilaterally, no wheezes, rhonchi, or rales Cardiovascular: regular rate rhythm, S1, S2, no murmurs, rubs or gallops Abdomen: Soft, non-tender, non-distended, normoactive bowel sounds, no HSM Extremities: warm and well perfused, no clubbing, cyanosis, or edema Skin: No rashes or lesions Neuro: CN II-XII intact.  Normal gait, 5/5 strength in all extremities.   ECOG PERFORMANCE STATUS: 0 - Asymptomatic    LABORATORY DATA: Lab Results  Component Value Date   WBC 3.6* 04/12/2013   HGB 13.9 04/12/2013   HCT 40.6 04/12/2013   MCV 99.0* 04/12/2013   PLT 128* 04/12/2013      Chemistry      Component Value Date/Time   NA 139 03/29/2013 0921   NA 138 03/02/2012 1100   K 4.9 03/29/2013 0921   K 4.5 03/02/2012 1100   CL 109* 01/19/2013 1102   CL 102 03/02/2012 1100   CO2 25 03/29/2013 0921   CO2 23 03/02/2012 1100   BUN 20.7 03/29/2013 0921   BUN 25* 03/02/2012 1100   CREATININE 0.8 03/29/2013 0921   CREATININE 1.05 03/02/2012 1100      Component Value Date/Time   CALCIUM 9.3 03/29/2013 0921   CALCIUM 9.5 03/02/2012 1100   ALKPHOS 114 02/01/2013 1059   ALKPHOS 130* 03/02/2012 1100   AST 38* 02/01/2013 1059   AST 39* 03/02/2012 1100   ALT 49 02/01/2013 1059   ALT 65* 03/02/2012 1100   BILITOT 0.44 02/01/2013 1059   BILITOT 0.4 03/02/2012 1100       RADIOGRAPHIC STUDIES:  No results found.  ASSESSMENT: 62 year old gentleman with:  1.  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 Overall he is doing well and tolerating his therapy quite well. He also is getting  Avastin and tolerating it very well.  2.  For his pulmonary embolus he is on Coumadin and he is  therapeutic with his INR.   #3  Hypertension--controlled on Lisinopril and HCTZ  PLAN:  #1 Patient will proceed with scheduled Avastin today.  I reviewed his lab work with him in detail.   He will take Temodar after he returns from his trip and start it on 9/22.    #2 we will continue anti-hypertensive regimen and monitor his BP closely.    #3 His INR is 2.5. He will continue Coumadin 10mg  daily.  He will re-check it in 2 weeks.    #4 He will return on 9/22 for labs, an appt, and Avastin.    #5 I prescribed Pantoprazole for his indigestion.  His indigestion is improved.  All questions were answered. The patient knows to call the clinic with any problems, questions or concerns. We can certainly see the patient much sooner if necessary.  I spent >25 minutes counseling the patient face to face. The total time spent in the appointment was 30 minutes.   This case was discussed with Dr. Humphrey Rolls.   Suzan Garibaldi Sheppard Coil, [place] Phone: [phone] 04/12/2013, 9:05 AM

## 2013-04-12 NOTE — Patient Instructions (Signed)
Doing well.  Proceed with Avastin.  Continue Coumadin 10mg  daily.  Please call us if you have any questions or concerns.

## 2013-04-26 ENCOUNTER — Ambulatory Visit (HOSPITAL_BASED_OUTPATIENT_CLINIC_OR_DEPARTMENT_OTHER): Payer: 59

## 2013-04-26 ENCOUNTER — Encounter: Payer: Self-pay | Admitting: Adult Health

## 2013-04-26 ENCOUNTER — Ambulatory Visit (HOSPITAL_BASED_OUTPATIENT_CLINIC_OR_DEPARTMENT_OTHER): Payer: 59 | Admitting: Adult Health

## 2013-04-26 ENCOUNTER — Other Ambulatory Visit (HOSPITAL_BASED_OUTPATIENT_CLINIC_OR_DEPARTMENT_OTHER): Payer: 59

## 2013-04-26 ENCOUNTER — Encounter: Payer: Self-pay | Admitting: Oncology

## 2013-04-26 VITALS — BP 134/90 | HR 61

## 2013-04-26 VITALS — BP 137/84 | HR 64 | Temp 97.5°F | Resp 20 | Ht 71.0 in | Wt 162.9 lb

## 2013-04-26 DIAGNOSIS — C719 Malignant neoplasm of brain, unspecified: Secondary | ICD-10-CM

## 2013-04-26 DIAGNOSIS — I82409 Acute embolism and thrombosis of unspecified deep veins of unspecified lower extremity: Secondary | ICD-10-CM

## 2013-04-26 DIAGNOSIS — I2699 Other pulmonary embolism without acute cor pulmonale: Secondary | ICD-10-CM

## 2013-04-26 DIAGNOSIS — Z5112 Encounter for antineoplastic immunotherapy: Secondary | ICD-10-CM

## 2013-04-26 DIAGNOSIS — I10 Essential (primary) hypertension: Secondary | ICD-10-CM

## 2013-04-26 DIAGNOSIS — C711 Malignant neoplasm of frontal lobe: Secondary | ICD-10-CM

## 2013-04-26 LAB — CBC WITH DIFFERENTIAL/PLATELET
Basophils Absolute: 0 10*3/uL (ref 0.0–0.1)
Eosinophils Absolute: 0.2 10*3/uL (ref 0.0–0.5)
HCT: 43.9 % (ref 38.4–49.9)
HGB: 14.9 g/dL (ref 13.0–17.1)
LYMPH%: 25.9 % (ref 14.0–49.0)
MCV: 99.8 fL — ABNORMAL HIGH (ref 79.3–98.0)
MONO%: 7.3 % (ref 0.0–14.0)
NEUT#: 2.3 10*3/uL (ref 1.5–6.5)
Platelets: 178 10*3/uL (ref 140–400)

## 2013-04-26 LAB — UA PROTEIN, DIPSTICK - CHCC: Protein, ur: 30 mg/dL

## 2013-04-26 LAB — PROTIME-INR

## 2013-04-26 MED ORDER — HEPARIN SOD (PORK) LOCK FLUSH 100 UNIT/ML IV SOLN
500.0000 [IU] | Freq: Once | INTRAVENOUS | Status: AC | PRN
  Administered 2013-04-26: 500 [IU]
  Filled 2013-04-26: qty 5

## 2013-04-26 MED ORDER — SODIUM CHLORIDE 0.9 % IJ SOLN
10.0000 mL | INTRAMUSCULAR | Status: DC | PRN
  Administered 2013-04-26: 10 mL
  Filled 2013-04-26: qty 10

## 2013-04-26 MED ORDER — SODIUM CHLORIDE 0.9 % IV SOLN
10.0000 mg/kg | Freq: Once | INTRAVENOUS | Status: AC
  Administered 2013-04-26: 750 mg via INTRAVENOUS
  Filled 2013-04-26: qty 30

## 2013-04-26 MED ORDER — SODIUM CHLORIDE 0.9 % IV SOLN
Freq: Once | INTRAVENOUS | Status: AC
  Administered 2013-04-26: 11:00:00 via INTRAVENOUS

## 2013-04-26 NOTE — Patient Instructions (Signed)
Bevacizumab injection What is this medicine? BEVACIZUMAB (be va SIZ yoo mab) is a chemotherapy drug. It targets a protein found in many cancer cell types, and halts cancer growth. This drug treats many cancers including non-small cell lung cancer, and colon or rectal cancer. It is usually given with other chemotherapy drugs. This medicine may be used for other purposes; ask your health care provider or pharmacist if you have questions. What should I tell my health care provider before I take this medicine? They need to know if you have any of these conditions: -blood clots -heart disease, including heart failure, heart attack, or chest pain (angina) -high blood pressure -infection (especially a virus infection such as chickenpox, cold sores, or herpes) -kidney disease -lung disease -prior chemotherapy with doxorubicin, daunorubicin, epirubicin, or other anthracycline type chemotherapy agents -recent or ongoing radiation therapy -recent surgery -stroke -an unusual or allergic reaction to bevacizumab, hamster proteins, mouse proteins, other medicines, foods, dyes, or preservatives -pregnant or trying to get pregnant -breast-feeding How should I use this medicine? This medicine is for infusion into a vein. It is given by a health care professional in a hospital or clinic setting. Talk to your pediatrician regarding the use of this medicine in children. Special care may be needed. Overdosage: If you think you have taken too much of this medicine contact a poison control center or emergency room at once. NOTE: This medicine is only for you. Do not share this medicine with others. What if I miss a dose? It is important not to miss your dose. Call your doctor or health care professional if you are unable to keep an appointment. What may interact with this medicine? Interactions are not expected. This list may not describe all possible interactions. Give your health care provider a list of all  the medicines, herbs, non-prescription drugs, or dietary supplements you use. Also tell them if you smoke, drink alcohol, or use illegal drugs. Some items may interact with your medicine. What should I watch for while using this medicine? Your condition will be monitored carefully while you are receiving this medicine. You will need important blood work and urine testing done while you are taking this medicine. During your treatment, let your health care professional know if you have any unusual symptoms, such as difficulty breathing. This medicine may rarely cause 'gastrointestinal perforation' (holes in the stomach, intestines or colon), a serious side effect requiring surgery to repair. This medicine should be started at least 28 days following major surgery and the site of the surgery should be totally healed. Check with your doctor before scheduling dental work or surgery while you are receiving this treatment. Talk to your doctor if you have recently had surgery or if you have a wound that has not healed. Do not become pregnant while taking this medicine. Women should inform their doctor if they wish to become pregnant or think they might be pregnant. There is a potential for serious side effects to an unborn child. Talk to your health care professional or pharmacist for more information. Do not breast-feed an infant while taking this medicine. This medicine has caused ovarian failure in some women. This medicine may interfere with the ability to have a child. You should talk to your doctor or health care professional if you are concerned about your fertility. What side effects may I notice from receiving this medicine? Side effects that you should report to your doctor or health care professional as soon as possible: -allergic reactions like skin  rash, itching or hives, swelling of the face, lips, or tongue -signs of infection - fever or chills, cough, sore throat, pain or trouble passing  urine -signs of decreased platelets or bleeding - bruising, pinpoint red spots on the skin, black, tarry stools, nosebleeds, blood in the urine -breathing problems -changes in vision -chest pain -confusion -jaw pain, especially after dental work -mouth sores -seizures -severe abdominal pain -severe headache -sudden numbness or weakness of the face, arm or leg -swelling of legs or ankles -symptoms of a stroke: change in mental awareness, inability to talk or move one side of the body (especially in patients with lung cancer) -trouble passing urine or change in the amount of urine -trouble speaking or understanding -trouble walking, dizziness, loss of balance or coordination Side effects that usually do not require medical attention (report to your doctor or health care professional if they continue or are bothersome): -constipation -diarrhea -dry skin -headache -loss of appetite -nausea, vomiting This list may not describe all possible side effects. Call your doctor for medical advice about side effects. You may report side effects to FDA at 1-800-FDA-1088. Where should I keep my medicine? This drug is given in a hospital or clinic and will not be stored at home. NOTE: This sheet is a summary. It may not cover all possible information. If you have questions about this medicine, talk to your doctor, pharmacist, or health care provider.  2012, [employer]. (06/22/2010 4:25:37 PM)

## 2013-04-26 NOTE — Progress Notes (Addendum)
OFFICE PROGRESS NOTE  Dr. 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  PRIOR THERAPY:  #1 patient underwent a resection of the brain tumor followed by concurrent chemotherapy and radiation therapy. The tumor chemotherapy consisted of Temodar 75 mg per meter squared.  #2 patient was then started on Temodar 150mg /meter squared given days 1 through 5 every 28 days with concomitant Avastin given every 2 weeks.  #3 patient also developed lower extremity DVT as well as as pulmonary embolism he was started on Coumadin to try to maintain an INR between 2 and 2.5.  CURRENT THERAPY: Patient is here for his scheduled Avastin, he will start Temodar today 04/26/13     INTERVAL HISTORY: Roger Raymond 62 y.o. male returns for followup visit today for his treatment with Avastin today.  He's recently been in [place] with his wife, and returned.  He will start temodar tonight.  His INR is 2.3 today, and hes taking Coumadin 10mg  daily and tolerating it well.  He denies easy bruising, bleeding.  He has a minor cold that he thinks he picked up from traveling.  It started 1.5 weeks ago and is improving.  He has cough, and nasal drainage.  Otherwise, he denies fevers, chills, nausea, vomiting, constipation, diarrhea, numbness or further concerns.    MEDICAL HISTORY: Past Medical History  Diagnosis Date  . 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 DVT (deep venous thrombosis) 07/08/2011    ALLERGIES:  has No Known Allergies.  MEDICATIONS:  Current Outpatient Prescriptions  Medication Sig Dispense Refill  . acetaminophen (TYLENOL) 325 MG tablet Take 650 mg by mouth every 6 (six) hours as needed.        . chlorproMAZINE (THORAZINE) 25 MG tablet Take 25 mg by mouth every 6 (six)  hours as needed.        . diphenoxylate-atropine (LOMOTIL) 2.5-0.025 MG per tablet Take 1 tablet by mouth 4 (four) times daily as needed. 1-2 tabs prn       . enoxaparin (LOVENOX) 120 MG/0.8ML injection Inject 0.74 mLs (110 mg total) into the skin daily.  20 Syringe  0  . hydrochlorothiazide (HYDRODIURIL) 25 MG tablet       . levETIRAcetam (KEPPRA) 500 MG tablet Take 500 mg by mouth 2 (two) times daily.        [name] lidocaine-prilocaine (EMLA) cream Apply topically as needed. Apply to port 1-2 hours before procedure  30 g  2  . lisinopril (PRINIVIL,ZESTRIL) 10 MG tablet Take 1 tablet (10 mg total) by mouth daily.  90 tablet  0  . ondansetron (ZOFRAN) 8 MG tablet Take 1 tablet (8 mg total) by mouth every 12 (twelve) hours as needed.  234 tablet  7  . pantoprazole (PROTONIX) 40 MG tablet Take 1 tablet (40 mg total) by mouth daily.  30 tablet  0  . sodium chloride (OCEAN) 0.65 % nasal spray Place 1 spray into the nose as needed.      . temozolomide (TEMODAR) 140 MG capsule Take 2 capsules daily x 5 days then repeat every 28 days  10 capsule  10  . warfarin (COUMADIN) 10 MG tablet Take 1 tablet (10 mg total) by mouth daily.  60 tablet  7  . warfarin (COUMADIN) 5 MG tablet  Take 1 tablet (5 mg total) by mouth daily. Or as directed by physician  64 tablet  0   No current facility-administered medications for this visit.    SURGICAL HISTORY:  Past Surgical History  Procedure Laterality Date  . Vasectomy      REVIEW OF SYSTEMS:  A 10 point review of systems was conducted and is otherwise negative except for what is noted above.     PHYSICAL EXAMINATION:  BP 137/84  Pulse 64  Temp(Src) 97.5 F (36.4 C) (Oral)  Resp 20  Ht 5\' 11"  (1.803 m)  Wt 162 lb 14.4 oz (73.891 kg)  BMI 22.73 kg/m2 General: Patient is a well appearing male in no acute distress HEENT: PERRLA, sclerae anicteric no conjunctival pallor, MMM Neck: supple, no palpable adenopathy Lungs: clear to auscultation bilaterally, no  wheezes, rhonchi, or rales Cardiovascular: regular rate rhythm, S1, S2, no murmurs, rubs or gallops Abdomen: Soft, non-tender, non-distended, normoactive bowel sounds, no HSM Extremities: warm and well perfused, no clubbing, cyanosis, or edema Skin: No rashes or lesions Neuro: CN II-XII intact.  Normal gait, 5/5 strength in all extremities.   ECOG PERFORMANCE STATUS: 0 - Asymptomatic    LABORATORY DATA: Lab Results  Component Value Date   WBC 3.7* 04/26/2013   HGB 14.9 04/26/2013   HCT 43.9 04/26/2013   MCV 99.8* 04/26/2013   PLT 178 04/26/2013      Chemistry      Component Value Date/Time   NA 141 04/12/2013 0813   NA 138 03/02/2012 1100   K 4.2 04/12/2013 0813   K 4.5 03/02/2012 1100   CL 109* 01/19/2013 1102   CL 102 03/02/2012 1100   CO2 27 04/12/2013 0813   CO2 23 03/02/2012 1100   BUN 14.0 04/12/2013 0813   BUN 25* 03/02/2012 1100   CREATININE 0.8 04/12/2013 0813   CREATININE 1.05 03/02/2012 1100      Component Value Date/Time   CALCIUM 8.5 04/12/2013 0813   CALCIUM 9.5 03/02/2012 1100   ALKPHOS 89 04/12/2013 0813   ALKPHOS 130* 03/02/2012 1100   AST 31 04/12/2013 0813   AST 39* 03/02/2012 1100   ALT 39 04/12/2013 0813   ALT 65* 03/02/2012 1100   BILITOT 0.45 04/12/2013 0813   BILITOT 0.4 03/02/2012 1100       RADIOGRAPHIC STUDIES:  No results found.  ASSESSMENT: 62 year old gentleman with:  1.  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 Overall he is doing well and tolerating his therapy quite well. He also is getting  Avastin and tolerating it very well.  2.  For his pulmonary embolus he is on Coumadin and he is  therapeutic with his INR.   #3 Hypertension--controlled on Lisinopril and HCTZ  PLAN:  #1 Patient will proceed with scheduled Avastin today.    #2 we will continue anti-hypertensive regimen and monitor his BP closely.    #3 His INR is 2.3 today.  He will continue Coumadin 10mg  daily.    #4 He will return in 2  weeks for labs, evaluation and his next Avastin appointment.    All questions were answered. The patient knows to call the clinic with any problems, questions or concerns. We can certainly see the patient much sooner if necessary.  I spent >25 minutes counseling the patient face to face. The total time spent in the appointment was 30 minutes.   This case was discussed with Dr. Humphrey Rolls.  Suzan Garibaldi Sheppard [place], [place] Phone: [phone] 04/26/2013, 10:04 PM  ATTENDING'S ATTESTATION:  I personally reviewed patient's chart, examined patient myself, formulated the treatment plan as followed.    Overall patient is doing remarkably well. He will proceed with his scheduled Avastin today. His hypertension is secondary to the Avastin he is on his antihypertensives. Also for his DVT he is on Coumadin. Patient and I discussed the possibility of getting him on xeralto in the near future. Patient is a avid traveler and I think this would make a world of difference to him so we do not have to keep a [place] on his INR. He understands risks and benefits of this. He will be seen back in 2 weeks' time for next Avastin.  Marcy Panning, MD Medical/Oncology [facility] [phone] (beeper) [phone] (Office)

## 2013-05-10 ENCOUNTER — Ambulatory Visit (HOSPITAL_BASED_OUTPATIENT_CLINIC_OR_DEPARTMENT_OTHER): Payer: 59 | Admitting: Oncology

## 2013-05-10 ENCOUNTER — Other Ambulatory Visit: Payer: Self-pay | Admitting: Oncology

## 2013-05-10 ENCOUNTER — Encounter: Payer: Self-pay | Admitting: Oncology

## 2013-05-10 ENCOUNTER — Other Ambulatory Visit (HOSPITAL_BASED_OUTPATIENT_CLINIC_OR_DEPARTMENT_OTHER): Payer: 59 | Admitting: Lab

## 2013-05-10 ENCOUNTER — Ambulatory Visit (HOSPITAL_BASED_OUTPATIENT_CLINIC_OR_DEPARTMENT_OTHER): Payer: 59

## 2013-05-10 VITALS — BP 138/91 | HR 60 | Temp 98.3°F | Resp 20 | Ht 71.0 in | Wt 160.5 lb

## 2013-05-10 DIAGNOSIS — I82409 Acute embolism and thrombosis of unspecified deep veins of unspecified lower extremity: Secondary | ICD-10-CM

## 2013-05-10 DIAGNOSIS — Z5112 Encounter for antineoplastic immunotherapy: Secondary | ICD-10-CM

## 2013-05-10 DIAGNOSIS — Z86718 Personal history of other venous thrombosis and embolism: Secondary | ICD-10-CM

## 2013-05-10 DIAGNOSIS — C711 Malignant neoplasm of frontal lobe: Secondary | ICD-10-CM

## 2013-05-10 DIAGNOSIS — I2699 Other pulmonary embolism without acute cor pulmonale: Secondary | ICD-10-CM

## 2013-05-10 DIAGNOSIS — Z7901 Long term (current) use of anticoagulants: Secondary | ICD-10-CM

## 2013-05-10 DIAGNOSIS — I10 Essential (primary) hypertension: Secondary | ICD-10-CM

## 2013-05-10 LAB — CBC WITH DIFFERENTIAL/PLATELET
BASO%: 0.3 % (ref 0.0–2.0)
Basophils Absolute: 0 10*3/uL (ref 0.0–0.1)
EOS%: 1.9 % (ref 0.0–7.0)
HGB: 13.7 g/dL (ref 13.0–17.1)
MCH: 33.3 pg (ref 27.2–33.4)
MCHC: 33.8 g/dL (ref 32.0–36.0)
MCV: 98.5 fL — ABNORMAL HIGH (ref 79.3–98.0)
MONO%: 9.3 % (ref 0.0–14.0)
RBC: 4.11 10*6/uL — ABNORMAL LOW (ref 4.20–5.82)
RDW: 13.4 % (ref 11.0–14.6)
WBC: 3.7 10*3/uL — ABNORMAL LOW (ref 4.0–10.3)
lymph#: 0.7 10*3/uL — ABNORMAL LOW (ref 0.9–3.3)
nRBC: 0 % (ref 0–0)

## 2013-05-10 LAB — PROTIME-INR
INR: 2.9 (ref 2.00–3.50)
Protime: 34.8 Seconds — ABNORMAL HIGH (ref 10.6–13.4)

## 2013-05-10 MED ORDER — SODIUM CHLORIDE 0.9 % IJ SOLN
10.0000 mL | INTRAMUSCULAR | Status: DC | PRN
  Administered 2013-05-10: 10 mL
  Filled 2013-05-10: qty 10

## 2013-05-10 MED ORDER — CLONIDINE HCL 0.1 MG PO TABS: 0.1000 mg | ORAL_TABLET | Freq: Two times a day (BID) | ORAL | Status: DC

## 2013-05-10 MED ORDER — SODIUM CHLORIDE 0.9 % IV SOLN
10.0000 mg/kg | Freq: Once | INTRAVENOUS | Status: AC
  Administered 2013-05-10: 750 mg via INTRAVENOUS
  Filled 2013-05-10: qty 30

## 2013-05-10 MED ORDER — CLONIDINE HCL 0.1 MG PO TABS
ORAL_TABLET | ORAL | Status: AC
  Filled 2013-05-10: qty 2

## 2013-05-10 MED ORDER — CLONIDINE HCL 0.1 MG PO TABS
0.2000 mg | ORAL_TABLET | Freq: Once | ORAL | Status: AC
  Administered 2013-05-10: 0.2 mg via ORAL

## 2013-05-10 MED ORDER — SODIUM CHLORIDE 0.9 % IV SOLN
Freq: Once | INTRAVENOUS | Status: AC
  Administered 2013-05-10: 13:00:00 via INTRAVENOUS

## 2013-05-10 MED ORDER — HEPARIN SOD (PORK) LOCK FLUSH 100 UNIT/ML IV SOLN
500.0000 [IU] | Freq: Once | INTRAVENOUS | Status: AC | PRN
  Administered 2013-05-10: 500 [IU]
  Filled 2013-05-10: qty 5

## 2013-05-10 NOTE — Patient Instructions (Signed)
[facility] Discharge Instructions for Patients Receiving Chemotherapy  Today you received the following chemotherapy agents Avastin.  To help prevent nausea and vomiting after your treatment, we encourage you to take your nausea medication.   If you develop nausea and vomiting that is not controlled by your nausea medication, call the clinic.   BELOW ARE SYMPTOMS THAT SHOULD BE REPORTED IMMEDIATELY:  *FEVER GREATER THAN 100.5 F  *CHILLS WITH OR WITHOUT FEVER  NAUSEA AND VOMITING THAT IS NOT CONTROLLED WITH YOUR NAUSEA MEDICATION  *UNUSUAL SHORTNESS OF BREATH  *UNUSUAL BRUISING OR BLEEDING  TENDERNESS IN MOUTH AND THROAT WITH OR WITHOUT PRESENCE OF ULCERS  *URINARY PROBLEMS  *BOWEL PROBLEMS  UNUSUAL RASH Items with * indicate a potential emergency and should be followed up as soon as possible.  Feel free to call the clinic you have any questions or concerns. The clinic phone number is [phone].    

## 2013-05-18 ENCOUNTER — Other Ambulatory Visit: Payer: Self-pay | Admitting: Dermatology

## 2013-05-24 ENCOUNTER — Ambulatory Visit: Payer: 59 | Admitting: Oncology

## 2013-05-24 ENCOUNTER — Ambulatory Visit (HOSPITAL_BASED_OUTPATIENT_CLINIC_OR_DEPARTMENT_OTHER): Payer: 59

## 2013-05-24 ENCOUNTER — Ambulatory Visit (HOSPITAL_BASED_OUTPATIENT_CLINIC_OR_DEPARTMENT_OTHER): Payer: 59 | Admitting: Nurse Practitioner

## 2013-05-24 ENCOUNTER — Telehealth: Payer: Self-pay | Admitting: Oncology

## 2013-05-24 ENCOUNTER — Other Ambulatory Visit: Payer: Self-pay | Admitting: Oncology

## 2013-05-24 ENCOUNTER — Other Ambulatory Visit: Payer: Self-pay | Admitting: Emergency Medicine

## 2013-05-24 ENCOUNTER — Other Ambulatory Visit: Payer: 59 | Admitting: Lab

## 2013-05-24 ENCOUNTER — Telehealth: Payer: Self-pay | Admitting: *Deleted

## 2013-05-24 ENCOUNTER — Other Ambulatory Visit (HOSPITAL_BASED_OUTPATIENT_CLINIC_OR_DEPARTMENT_OTHER): Payer: 59 | Admitting: Lab

## 2013-05-24 VITALS — BP 137/90 | HR 66 | Temp 96.8°F | Resp 20 | Ht 71.0 in | Wt 159.7 lb

## 2013-05-24 DIAGNOSIS — Z5112 Encounter for antineoplastic immunotherapy: Secondary | ICD-10-CM

## 2013-05-24 DIAGNOSIS — I82402 Acute embolism and thrombosis of unspecified deep veins of left lower extremity: Secondary | ICD-10-CM

## 2013-05-24 DIAGNOSIS — I82409 Acute embolism and thrombosis of unspecified deep veins of unspecified lower extremity: Secondary | ICD-10-CM

## 2013-05-24 DIAGNOSIS — C711 Malignant neoplasm of frontal lobe: Secondary | ICD-10-CM

## 2013-05-24 DIAGNOSIS — I10 Essential (primary) hypertension: Secondary | ICD-10-CM

## 2013-05-24 LAB — COMPREHENSIVE METABOLIC PANEL (CC13)
ALT: 31 U/L (ref 0–55)
Albumin: 3.7 g/dL (ref 3.5–5.0)
Alkaline Phosphatase: 91 U/L (ref 40–150)
Anion Gap: 10 mEq/L (ref 3–11)
CO2: 26 mEq/L (ref 22–29)
Creatinine: 0.8 mg/dL (ref 0.7–1.3)
Glucose: 86 mg/dl (ref 70–140)
Potassium: 4.5 mEq/L (ref 3.5–5.1)
Sodium: 140 mEq/L (ref 136–145)
Total Protein: 6.6 g/dL (ref 6.4–8.3)

## 2013-05-24 LAB — CBC WITH DIFFERENTIAL/PLATELET
Basophils Absolute: 0 10*3/uL (ref 0.0–0.1)
Eosinophils Absolute: 0.1 10*3/uL (ref 0.0–0.5)
HCT: 42.3 % (ref 38.4–49.9)
HGB: 14.6 g/dL (ref 13.0–17.1)
MCV: 98.8 fL — ABNORMAL HIGH (ref 79.3–98.0)
NEUT#: 2.7 10*3/uL (ref 1.5–6.5)
RBC: 4.28 10*6/uL (ref 4.20–5.82)
RDW: 13.4 % (ref 11.0–14.6)
WBC: 4.2 10*3/uL (ref 4.0–10.3)
lymph#: 1.1 10*3/uL (ref 0.9–3.3)
nRBC: 0 % (ref 0–0)

## 2013-05-24 LAB — PROTIME-INR: INR: 2 (ref 2.00–3.50)

## 2013-05-24 LAB — UA PROTEIN, DIPSTICK - CHCC: Protein, ur: 30 mg/dL

## 2013-05-24 MED ORDER — SODIUM CHLORIDE 0.9 % IJ SOLN
10.0000 mL | INTRAMUSCULAR | Status: DC | PRN
  Administered 2013-05-24: 10 mL
  Filled 2013-05-24: qty 10

## 2013-05-24 MED ORDER — SODIUM CHLORIDE 0.9 % IV SOLN
Freq: Once | INTRAVENOUS | Status: AC
  Administered 2013-05-24: 15:00:00 via INTRAVENOUS

## 2013-05-24 MED ORDER — HEPARIN SOD (PORK) LOCK FLUSH 100 UNIT/ML IV SOLN
500.0000 [IU] | Freq: Once | INTRAVENOUS | Status: AC | PRN
  Administered 2013-05-24: 500 [IU]
  Filled 2013-05-24: qty 5

## 2013-05-24 MED ORDER — SODIUM CHLORIDE 0.9 % IV SOLN
10.0000 mg/kg | Freq: Once | INTRAVENOUS | Status: AC
  Administered 2013-05-24: 750 mg via INTRAVENOUS
  Filled 2013-05-24: qty 30

## 2013-05-24 NOTE — Telephone Encounter (Signed)
Emailed Roger Raymond regarding Avastin on  11/13th

## 2013-05-24 NOTE — Progress Notes (Signed)
OFFICE PROGRESS NOTE  Dr. 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  PRIOR THERAPY:  #1 patient underwent a resection of the brain tumor followed by concurrent chemotherapy and radiation therapy. The tumor chemotherapy consisted of Temodar 75 mg per meter squared.  #2 patient was then started on Temodar 150mg /meter squared given days 1 through 5 every 28 days with concomitant Avastin given every 2 weeks.  #3 patient also developed lower extremity DVT as well as as pulmonary embolism he was started on Coumadin to try to maintain an INR between 2 and 2.5.  CURRENT THERAPY: Patient is here for his scheduled Avastin,    INTERVAL HISTORY: Roger Raymond 62 y.o. male returns for followup visit today for his treatment with Avastin today.  He's recently been in [place] with his wife, and returned.  He denies easy bruising, bleeding.  He has not noticed any lower extra the swelling. He continues to be on Coumadin with therapeutic INR is. We did discuss alternative anticoagulation such as xeralto, he denies fevers, chills, nausea, vomiting, constipation, diarrhea, numbness or further concerns.    MEDICAL HISTORY: Past Medical History  Diagnosis Date  . 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 DVT (deep venous thrombosis) 07/08/2011    ALLERGIES:  has No Known Allergies.  MEDICATIONS:  Current Outpatient Prescriptions  Medication Sig Dispense Refill  . acetaminophen (TYLENOL) 325 MG tablet Take 650 mg by mouth every 6 (six) hours as needed.        . cloNIDine (CATAPRES) 0.1 MG tablet Take 1 tablet (0.1 mg total) by mouth 2 (two) times daily.  60 tablet  11  . diphenoxylate-atropine (LOMOTIL) 2.5-0.025 MG per tablet Take 1 tablet by mouth 4 (four) times daily as  needed. 1-2 tabs prn       . enoxaparin (LOVENOX) 120 MG/0.8ML injection Inject 0.74 mLs (110 mg total) into the skin daily.  20 Syringe  0  . levETIRAcetam (KEPPRA) 500 MG tablet Take 500 mg by mouth 2 (two) times daily.        [name] lidocaine-prilocaine (EMLA) cream Apply topically as needed. Apply to port 1-2 hours before procedure  30 g  2  . lisinopril (PRINIVIL,ZESTRIL) 10 MG tablet Take 1 tablet (10 mg total) by mouth daily.  90 tablet  0  . ondansetron (ZOFRAN) 8 MG tablet Take 1 tablet (8 mg total) by mouth every 12 (twelve) hours as needed.  234 tablet  7  . pantoprazole (PROTONIX) 40 MG tablet Take 1 tablet (40 mg total) by mouth daily.  30 tablet  0  . sodium chloride (OCEAN) 0.65 % nasal spray Place 1 spray into the nose as needed.      . temozolomide (TEMODAR) 140 MG capsule Take 2 capsules daily x 5 days then repeat every 28 days  10 capsule  10  . warfarin (COUMADIN) 10 MG tablet Take 1 tablet (10 mg total) by mouth daily.  60 tablet  7   No current facility-administered medications for this visit.    SURGICAL HISTORY:  Past Surgical History  Procedure Laterality Date  . Vasectomy      REVIEW OF SYSTEMS:  A 10 point review of systems was conducted and is otherwise negative except for what is noted above.  PHYSICAL EXAMINATION:  BP 138/91  Pulse 60  Temp(Src) 98.3 F (36.8 C) (Oral)  Resp 20  Ht 5\' 11"  (1.803 m)  Wt 160 lb 8 oz (72.802 kg)  BMI 22.4 kg/m2 General: Patient is a well appearing male in no acute distress HEENT: PERRLA, sclerae anicteric no conjunctival pallor, MMM Neck: supple, no palpable adenopathy Lungs: clear to auscultation bilaterally, no wheezes, rhonchi, or rales Cardiovascular: regular rate rhythm, S1, S2, no murmurs, rubs or gallops Abdomen: Soft, non-tender, non-distended, normoactive bowel sounds, no HSM Extremities: warm and well perfused, no clubbing, cyanosis, or edema Skin: No rashes or lesions Neuro: CN II-XII intact.  Normal gait,  5/5 strength in all extremities.   ECOG PERFORMANCE STATUS: 0 - Asymptomatic    LABORATORY DATA: Lab Results  Component Value Date   WBC 4.2 05/24/2013   HGB 14.6 05/24/2013   HCT 42.3 05/24/2013   MCV 98.8* 05/24/2013   PLT 153 05/24/2013      Chemistry      Component Value Date/Time   NA 140 05/24/2013 1401   NA 138 03/02/2012 1100   K 4.5 05/24/2013 1401   K 4.5 03/02/2012 1100   CL 109* 01/19/2013 1102   CL 102 03/02/2012 1100   CO2 26 05/24/2013 1401   CO2 23 03/02/2012 1100   BUN 15.4 05/24/2013 1401   BUN 25* 03/02/2012 1100   CREATININE 0.8 05/24/2013 1401   CREATININE 1.05 03/02/2012 1100      Component Value Date/Time   CALCIUM 9.1 05/24/2013 1401   CALCIUM 9.5 03/02/2012 1100   ALKPHOS 91 05/24/2013 1401   ALKPHOS 130* 03/02/2012 1100   AST 26 05/24/2013 1401   AST 39* 03/02/2012 1100   ALT 31 05/24/2013 1401   ALT 65* 03/02/2012 1100   BILITOT 0.53 05/24/2013 1401   BILITOT 0.4 03/02/2012 1100       RADIOGRAPHIC STUDIES:  No results found.  ASSESSMENT: 62 year old gentleman with:  1.  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 Overall he is doing well and tolerating his therapy quite well. He also is getting  Avastin and tolerating it very well.  2.  For his pulmonary embolus he is on Coumadin and he is  therapeutic with his INR.   #3 Hypertension--controlled on Lisinopril and HCTZ  PLAN:  #1 Patient will proceed with scheduled Avastin today.    #2 we will continue anti-hypertensive regimen and monitor his BP closely.    #3 His INR is 2.3 today.  He will continue Coumadin 10mg  daily.  He is traveling out of town and we will at this time not switch him to [employer]. However once he returns from his recent travel then he may be a good candidate  #4 He will return in 2 weeks for labs, evaluation and his next Avastin appointment.    All questions were answered. The patient knows to call the clinic  with any problems, questions or concerns. We can certainly see the patient much sooner if necessary.  I spent >25 minutes counseling the patient face to face. The total time spent in the appointment was 30 minutes.    Marcy Panning, MD Medical/Oncology [facility] [phone] (beeper) [phone] (Office)

## 2013-05-24 NOTE — Patient Instructions (Signed)
[facility] Discharge Instructions for Patients Receiving Chemotherapy  Today you received the following chemotherapy agents Avastin  To help prevent nausea and vomiting after your treatment, we encourage you to take your nausea medication as needed   If you develop nausea and vomiting that is not controlled by your nausea medication, call the clinic.   BELOW ARE SYMPTOMS THAT SHOULD BE REPORTED IMMEDIATELY:  *FEVER GREATER THAN 100.5 F  *CHILLS WITH OR WITHOUT FEVER  NAUSEA AND VOMITING THAT IS NOT CONTROLLED WITH YOUR NAUSEA MEDICATION  *UNUSUAL SHORTNESS OF BREATH  *UNUSUAL BRUISING OR BLEEDING  TENDERNESS IN MOUTH AND THROAT WITH OR WITHOUT PRESENCE OF ULCERS  *URINARY PROBLEMS  *BOWEL PROBLEMS  UNUSUAL RASH Items with * indicate a potential emergency and should be followed up as soon as possible.  Feel free to call the clinic you have any questions or concerns. The clinic phone number is [phone].    

## 2013-05-24 NOTE — Progress Notes (Signed)
OFFICE PROGRESS NOTE  Interval history:  Mr. 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 Humphrey Rolls currently being treated with Avastin every 2 weeks and Temodar days 1 through 5 on a 28 day cycle. He is seen today for scheduled followup.  He reports he will begin the current cycle of Temodar today. He overall is feeling well. He denies nausea/vomiting. He takes Zofran prior to each dose of Temodar. No mouth sores. He has occasional diarrhea and occasional constipation. He denies any bleeding except occasional minor epistaxis related to a change in the weather. No skin rash. No unusual headaches. No vision change. He denies any falls or balance problems. No focal extremity weakness. He denies shortness of breath, chest pain, leg swelling and calf pain. He continues Coumadin for history of a left leg DVT. Current dose is 10 mg daily. He takes Lovenox in place of Coumadin when he is out of town.  He thinks his last MRI was in May of this year and the next one is scheduled for November. He has the MRIs done at [facility].  He reports recently receiving a prescription for clonidine. He was not sure why it was prescribed so he has not started it yet.   Objective: Blood pressure 137/90, pulse 66, temperature 96.8 F (36 C), temperature source Oral, resp. rate 20, height 5\' 11"  (1.803 m), weight 159 lb 11.2 oz (72.439 kg).  Pupils equal round and reactive to light. Extraocular movements intact. Sclera anicteric. Oropharynx is without thrush or ulceration. No palpable cervical, supraclavicular or axillary lymph nodes. Lungs are clear. No wheezes or rales. Regular cardiac rhythm. No murmur. Abdomen is soft and nontender. No organomegaly. Extremities are without edema. Calves soft and nontender. Motor strength 5 over 5. Knee DTRs 2+, symmetric. Alert and oriented. Follows commands. Finger to nose and rapid alternating movement intact. No skin rash.  Lab Results: Lab Results  Component Value Date    WBC 4.2 05/24/2013   HGB 14.6 05/24/2013   HCT 42.3 05/24/2013   MCV 98.8* 05/24/2013   PLT 153 05/24/2013    Chemistry:    Chemistry      Component Value Date/Time   NA 141 04/12/2013 0813   NA 138 03/02/2012 1100   K 4.2 04/12/2013 0813   K 4.5 03/02/2012 1100   CL 109* 01/19/2013 1102   CL 102 03/02/2012 1100   CO2 27 04/12/2013 0813   CO2 23 03/02/2012 1100   BUN 14.0 04/12/2013 0813   BUN 25* 03/02/2012 1100   CREATININE 0.8 04/12/2013 0813   CREATININE 1.05 03/02/2012 1100      Component Value Date/Time   CALCIUM 8.5 04/12/2013 0813   CALCIUM 9.5 03/02/2012 1100   ALKPHOS 89 04/12/2013 0813   ALKPHOS 130* 03/02/2012 1100   AST 31 04/12/2013 0813   AST 39* 03/02/2012 1100   ALT 39 04/12/2013 0813   ALT 65* 03/02/2012 1100   BILITOT 0.45 04/12/2013 0813   BILITOT 0.4 03/02/2012 1100       Studies/Results: No results found.  Medications: I have reviewed the patient's current medications.  Assessment/Plan:  1. GBM currently on Avastin every 2 weeks and Temodar days 1 through 5 of a 28 day cycle. 2. History of left leg DVT. The PT/INR is in therapeutic range. He will continue Coumadin at the current dose. 3. Hypertension. He continues Lisinopril. We will ask Dr. Laurelyn Sickle office to contact him regarding the clonidine prescription he recently received.  Disposition-Mr. Laffey appears stable.  He will continue Avastin and Temodar as outlined above. He will begin the current cycle of Temodar today. The timing of the next MRI is unclear. We will defer to Dr. Humphrey Rolls. He is scheduled to return for a followup visit and Avastin in 2 weeks. He will contact the office in the interim with any problems.  Plan reviewed with Dr. Benay Spice.  Ned Card ANP/GNP-BC

## 2013-05-24 NOTE — Telephone Encounter (Signed)
Per staff message and POF I have scheduled appts.  JMW  

## 2013-05-24 NOTE — Telephone Encounter (Signed)
Notified scheduler that first available slot for 11/3 was 1pm

## 2013-05-25 ENCOUNTER — Telehealth: Payer: Self-pay | Admitting: Oncology

## 2013-05-25 NOTE — Telephone Encounter (Signed)
Talked to pt and gave him appt for October and November 2014

## 2013-06-07 ENCOUNTER — Telehealth: Payer: Self-pay | Admitting: *Deleted

## 2013-06-07 ENCOUNTER — Encounter: Payer: Self-pay | Admitting: Adult Health

## 2013-06-07 ENCOUNTER — Ambulatory Visit (HOSPITAL_BASED_OUTPATIENT_CLINIC_OR_DEPARTMENT_OTHER): Payer: 59

## 2013-06-07 ENCOUNTER — Ambulatory Visit (HOSPITAL_BASED_OUTPATIENT_CLINIC_OR_DEPARTMENT_OTHER): Payer: 59 | Admitting: Adult Health

## 2013-06-07 ENCOUNTER — Encounter: Payer: Self-pay | Admitting: Oncology

## 2013-06-07 ENCOUNTER — Other Ambulatory Visit (HOSPITAL_BASED_OUTPATIENT_CLINIC_OR_DEPARTMENT_OTHER): Payer: 59 | Admitting: Lab

## 2013-06-07 VITALS — BP 155/92 | HR 70 | Temp 98.6°F | Resp 18 | Ht 71.0 in | Wt 162.2 lb

## 2013-06-07 VITALS — BP 150/90

## 2013-06-07 DIAGNOSIS — C711 Malignant neoplasm of frontal lobe: Secondary | ICD-10-CM

## 2013-06-07 DIAGNOSIS — R5381 Other malaise: Secondary | ICD-10-CM

## 2013-06-07 DIAGNOSIS — R51 Headache: Secondary | ICD-10-CM

## 2013-06-07 DIAGNOSIS — I82409 Acute embolism and thrombosis of unspecified deep veins of unspecified lower extremity: Secondary | ICD-10-CM

## 2013-06-07 DIAGNOSIS — Z5112 Encounter for antineoplastic immunotherapy: Secondary | ICD-10-CM

## 2013-06-07 DIAGNOSIS — I10 Essential (primary) hypertension: Secondary | ICD-10-CM

## 2013-06-07 DIAGNOSIS — I82402 Acute embolism and thrombosis of unspecified deep veins of left lower extremity: Secondary | ICD-10-CM

## 2013-06-07 DIAGNOSIS — I2699 Other pulmonary embolism without acute cor pulmonale: Secondary | ICD-10-CM

## 2013-06-07 DIAGNOSIS — Z5111 Encounter for antineoplastic chemotherapy: Secondary | ICD-10-CM

## 2013-06-07 LAB — CBC WITH DIFFERENTIAL/PLATELET
BASO%: 0.2 % (ref 0.0–2.0)
LYMPH%: 16.9 % (ref 14.0–49.0)
MCHC: 34.1 g/dL (ref 32.0–36.0)
MONO#: 0.3 10*3/uL (ref 0.1–0.9)
MONO%: 5.4 % (ref 0.0–14.0)
Platelets: 170 10*3/uL (ref 140–400)
RBC: 4.36 10*6/uL (ref 4.20–5.82)
RDW: 13.2 % (ref 11.0–14.6)
WBC: 4.9 10*3/uL (ref 4.0–10.3)
nRBC: 0 % (ref 0–0)

## 2013-06-07 LAB — COMPREHENSIVE METABOLIC PANEL (CC13)
ALT: 33 U/L (ref 0–55)
AST: 29 U/L (ref 5–34)
Albumin: 3.3 g/dL — ABNORMAL LOW (ref 3.5–5.0)
Anion Gap: 10 mEq/L (ref 3–11)
CO2: 26 mEq/L (ref 22–29)
Chloride: 104 mEq/L (ref 98–109)
Creatinine: 0.8 mg/dL (ref 0.7–1.3)
Potassium: 4.1 mEq/L (ref 3.5–5.1)
Sodium: 140 mEq/L (ref 136–145)
Total Bilirubin: 0.4 mg/dL (ref 0.20–1.20)
Total Protein: 6 g/dL — ABNORMAL LOW (ref 6.4–8.3)

## 2013-06-07 LAB — UA PROTEIN, DIPSTICK - CHCC: Protein, ur: 100 mg/dL

## 2013-06-07 LAB — PROTIME-INR: Protime: 31.2 Seconds — ABNORMAL HIGH (ref 10.6–13.4)

## 2013-06-07 MED ORDER — SODIUM CHLORIDE 0.9 % IV SOLN
Freq: Once | INTRAVENOUS | Status: AC
  Administered 2013-06-07: 14:00:00 via INTRAVENOUS

## 2013-06-07 MED ORDER — SODIUM CHLORIDE 0.9 % IV SOLN
10.0000 mg/kg | Freq: Once | INTRAVENOUS | Status: AC
  Administered 2013-06-07: 750 mg via INTRAVENOUS
  Filled 2013-06-07: qty 30

## 2013-06-07 MED ORDER — HEPARIN SOD (PORK) LOCK FLUSH 100 UNIT/ML IV SOLN
500.0000 [IU] | Freq: Once | INTRAVENOUS | Status: AC | PRN
  Administered 2013-06-07: 500 [IU]
  Filled 2013-06-07: qty 5

## 2013-06-07 MED ORDER — SODIUM CHLORIDE 0.9 % IJ SOLN
10.0000 mL | INTRAMUSCULAR | Status: DC | PRN
  Administered 2013-06-07: 10 mL
  Filled 2013-06-07: qty 10

## 2013-06-07 NOTE — Progress Notes (Signed)
Okay to treat today with urine protein equal to 100 per Dr. Welton Flakes. Angelena Form, RN

## 2013-06-07 NOTE — Progress Notes (Addendum)
OFFICE PROGRESS NOTE  Dr. 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  PRIOR THERAPY:  #1 patient underwent a resection of the brain tumor followed by concurrent chemotherapy and radiation therapy. The tumor chemotherapy consisted of Temodar 75 mg per meter squared.  #2 patient was then started on Temodar 150mg /meter squared given days 1 through 5 every 28 days with concomitant Avastin given every 2 weeks.  His last cycle of temodar was on 05/24/13.    #3 patient also developed lower extremity DVT as well as as pulmonary embolism he was started on Coumadin to try to maintain an INR between 2 and 2.5.  CURRENT THERAPY: Patient is here for his scheduled Avastin,  On Temodar every 28 days.    INTERVAL HISTORY: Roger Raymond 61 y.o. male returns for followup visit today for his treatment with Avastin today.  He is doing well today.  He has had dull headaches slightly more often than usual.  His wife is with him today, and she is concerned because he has been fatigued and hasn't bounced back as he usually does after the past 2-3 cycles of Temodar.  They also are inquisitive about Xarelto.  He denies weakness, numbness, vision changes, fevers, chills, seizures, or any other concerns.    MEDICAL HISTORY: Past Medical History  Diagnosis Date  . 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 DVT (deep venous thrombosis) 07/08/2011    ALLERGIES:  has No Known Allergies.  MEDICATIONS:  Current Outpatient Prescriptions  Medication Sig Dispense Refill  . acetaminophen (TYLENOL) 325 MG tablet Take 650 mg by mouth every 6 (six) hours as needed.        . cloNIDine (CATAPRES) 0.1 MG tablet Take 1 tablet (0.1 mg total) by mouth 2 (two) times daily.  60 tablet  11  . diphenoxylate-atropine (LOMOTIL) 2.5-0.025 MG per  tablet Take 1 tablet by mouth 4 (four) times daily as needed. 1-2 tabs prn       . enoxaparin (LOVENOX) 120 MG/0.8ML injection Inject 0.74 mLs (110 mg total) into the skin daily.  20 Syringe  0  . levETIRAcetam (KEPPRA) 500 MG tablet Take 500 mg by mouth 2 (two) times daily.        [name] lidocaine-prilocaine (EMLA) cream Apply topically as needed. Apply to port 1-2 hours before procedure  30 g  2  . lisinopril (PRINIVIL,ZESTRIL) 10 MG tablet Take 1 tablet (10 mg total) by mouth daily.  90 tablet  0  . ondansetron (ZOFRAN) 8 MG tablet Take 1 tablet (8 mg total) by mouth every 12 (twelve) hours as needed.  234 tablet  7  . pantoprazole (PROTONIX) 40 MG tablet Take 1 tablet (40 mg total) by mouth daily.  30 tablet  0  . sodium chloride (OCEAN) 0.65 % nasal spray Place 1 spray into the nose as needed.      . temozolomide (TEMODAR) 140 MG capsule Take 2 capsules daily x 5 days then repeat every 28 days  10 capsule  10  . warfarin (COUMADIN) 10 MG tablet Take 1 tablet (10 mg total) by mouth daily.  60 tablet  7   No current facility-administered medications for this visit.    SURGICAL HISTORY:  Past Surgical History  Procedure Laterality Date  . Vasectomy      REVIEW OF SYSTEMS:  A 10 point review of systems was conducted and is otherwise negative except for what is noted above.     PHYSICAL EXAMINATION:  BP 155/92  Pulse 70  Temp(Src) 98.6 F (37 C) (Oral)  Resp 18  Ht 5\' 11"  (1.803 m)  Wt 162 lb 3.2 oz (73.573 kg)  BMI 22.63 kg/m2 General: Patient is a well appearing male in no acute distress HEENT: PERRLA, sclerae anicteric no conjunctival pallor, MMM Neck: supple, no palpable adenopathy Lungs: clear to auscultation bilaterally, no wheezes, rhonchi, or rales Cardiovascular: regular rate rhythm, S1, S2, no murmurs, rubs or gallops Abdomen: Soft, non-tender, non-distended, normoactive bowel sounds, no HSM Extremities: warm and well perfused, no clubbing, cyanosis, or edema Skin: No  rashes or lesions Neuro: CN II-XII intact.  Normal gait, 5/5 strength in all extremities.   ECOG PERFORMANCE STATUS: 0 - Asymptomatic    LABORATORY DATA: Lab Results  Component Value Date   WBC 4.9 06/07/2013   HGB 14.5 06/07/2013   HCT 42.5 06/07/2013   MCV 97.5 06/07/2013   PLT 170 06/07/2013      Chemistry      Component Value Date/Time   NA 140 06/07/2013 1004   NA 138 03/02/2012 1100   K 4.1 06/07/2013 1004   K 4.5 03/02/2012 1100   CL 109* 01/19/2013 1102   CL 102 03/02/2012 1100   CO2 26 06/07/2013 1004   CO2 23 03/02/2012 1100   BUN 15.6 06/07/2013 1004   BUN 25* 03/02/2012 1100   CREATININE 0.8 06/07/2013 1004   CREATININE 1.05 03/02/2012 1100      Component Value Date/Time   CALCIUM 9.0 06/07/2013 1004   CALCIUM 9.5 03/02/2012 1100   ALKPHOS 80 06/07/2013 1004   ALKPHOS 130* 03/02/2012 1100   AST 29 06/07/2013 1004   AST 39* 03/02/2012 1100   ALT 33 06/07/2013 1004   ALT 65* 03/02/2012 1100   BILITOT 0.40 06/07/2013 1004   BILITOT 0.4 03/02/2012 1100       RADIOGRAPHIC STUDIES:  No results found.  ASSESSMENT: 62 year old gentleman with:  1.  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 Overall he is doing well and tolerating his therapy quite well. He also is getting  Avastin and tolerating it very well.  2.  For his pulmonary embolus he is on Coumadin and he is  therapeutic with his INR.   #3 Hypertension--controlled on Lisinopril and HCTZ, He has clonidine to use prior to his treatment days.    PLAN:  #1 Patient will proceed with scheduled Avastin today.  He will not receive any further doses or cycles of Temodar.  He will contact Dr. Donnella Bi office as he needs f/u with him soon along with MRI.      #2 We will continue anti-hypertensive regimen and monitor his BP closely.    #3 His INR is 2.6 today.  He will continue Coumadin 10mg  daily.  We discussed Xarelto, and he will stay on Coumadin since he is doing  well with this.    #4 He will return in 2 weeks for labs, evaluation and his next Avastin appointment.  We requested scheduling of his appointments through December.  He will be going to [place] 08/03/13 for the winter/early spring.  At that time he will likely be able to receive Avastin every 3 weeks.    All questions were answered. The patient knows to call the clinic with any problems, questions  or concerns. We can certainly see the patient much sooner if necessary.  I spent >25 minutes counseling the patient face to face. The total time spent in the appointment was 30 minutes.    Minette Headland, [place] [phone]  ATTENDING'S ATTESTATION:  I personally reviewed patient's chart, examined patient myself, formulated the treatment plan as followed.    Overall Mrs. the is tolerating the Avastin well. His counts are holding 12. He is a retired. This may be due to the Temodar. We discussed stopping the Temodar altogether at this time. In the meantime we will continue the Avastin 2 weeks for his next MRI. Thereafter we certainly could change the schedule of Avastin to 3 weeks with the same dose that he is receiving at this time. Discussed the clonidine will take this when his blood pressure is elevated only in meantime he will continue the lisinopril and hydrochlorothiazide.  Patient continues to be on Coumadin. We have discussed placing him on xeralto however at this time they do not want to go on therefore he will continue this is primary anticoagulants he'll be seen back in signed for followup  Marcy Panning, MD Medical/Oncology [facility] [phone] (beeper) [phone] (Office)  06/11/2013, 1:53 PM

## 2013-06-07 NOTE — Telephone Encounter (Signed)
appts made and printed. Pt is aware that tx will be added. i emailed MW to add the tx's...td 

## 2013-06-07 NOTE — Telephone Encounter (Signed)
Per staff message and POF I have scheduled appts.  JMW  

## 2013-06-07 NOTE — Patient Instructions (Signed)
[facility] Discharge Instructions for Patients Receiving Chemotherapy  Today you received the following chemotherapy agents Avastin.  To help prevent nausea and vomiting after your treatment, we encourage you to take your nausea medication as prescribed.   If you develop nausea and vomiting that is not controlled by your nausea medication, call the clinic.   BELOW ARE SYMPTOMS THAT SHOULD BE REPORTED IMMEDIATELY:  *FEVER GREATER THAN 100.5 F  *CHILLS WITH OR WITHOUT FEVER  NAUSEA AND VOMITING THAT IS NOT CONTROLLED WITH YOUR NAUSEA MEDICATION  *UNUSUAL SHORTNESS OF BREATH  *UNUSUAL BRUISING OR BLEEDING  TENDERNESS IN MOUTH AND THROAT WITH OR WITHOUT PRESENCE OF ULCERS  *URINARY PROBLEMS  *BOWEL PROBLEMS  UNUSUAL RASH Items with * indicate a potential emergency and should be followed up as soon as possible.  Feel free to call the clinic you have any questions or concerns. The clinic phone number is [phone].    

## 2013-06-07 NOTE — Patient Instructions (Signed)
Rivaroxaban oral tablets What is this medicine? RIVAROXABAN (ri va ROX a ban) is an anticoagulant (blood thinner). It is used to treat blood clots in the lungs or in the veins. It is also used after knee or hip surgeries to prevent blood clots. It is also used to lower the chance of stroke in people with a medical condition called atrial fibrillation. This medicine may be used for other purposes; ask your health care provider or pharmacist if you have questions. What should I tell my health care provider before I take this medicine? They need to know if you have any of these conditions: -bleeding disorders -bleeding in the brain -blood in your stools (black or tarry stools) or if you have blood in your vomit -history of stomach bleeding -kidney disease -liver disease -low blood counts, like low white cell, platelet, or red cell counts -recent or planned spinal or epidural procedure -take medicines that treat or prevent blood clots -an unusual or allergic reaction to rivaroxaban, other medicines, foods, dyes, or preservatives -pregnant or trying to get pregnant -breast-feeding How should I use this medicine? Take this medicine by mouth with a glass of water. Follow the directions on the prescription label. Take your medicine at regular intervals. Do not take it more often than directed. Do not stop taking except on your doctor's advice. If you are taking this medicine after hip or knee replacement surgery, take it with or without food. If you are taking this medicine for atrial fibrillation, take it with your evening meal. If you are taking this medicine to treat blood clots, take it with food at the same time each day. If you are unable to swallow your tablet, you may crush the tablet and mix it in applesauce. Then, immediately eat the applesauce. You should eat more food right after you eat the applesauce containing the crushed tablet. Talk to your pediatrician regarding the use of this  medicine in children. Special care may be needed. Overdosage: If you think you've taken too much of this medicine contact a poison control center or emergency room at once. Overdosage: If you think you have taken too much of this medicine contact a poison control center or emergency room at once. NOTE: This medicine is only for you. Do not share this medicine with others. What if I miss a dose? If you take your medicine once a day and miss a dose, take the missed dose as soon as you remember. If you take your medicine twice a day and miss a dose, take the missed dose immediately. In this instance, 2 tablets may be taken at the same time. The next day you should take 1 tablet twice a day as directed. What may interact with this medicine? -aspirin and aspirin-like medicines -certain antibiotics like erythromycin, azithromycin, and clarithromycin -certain medicines for fungal infections like ketoconazole and itraconazole -certain medicines for irregular heart beat like amiodarone, quinidine, dronedarone -certain medicines for seizures like carbamazepine, phenytoin -certain medicines that treat or prevent blood clots like warfarin, enoxaparin, and dalteparin  -conivaptan -diltiazem -felodipine -indinavir -lopinavir; ritonavir -NSAIDS, medicines for pain and inflammation, like ibuprofen or naproxen -ranolazine -rifampin -ritonavir -St. John's wort -verapamil This list may not describe all possible interactions. Give your health care provider a list of all the medicines, herbs, non-prescription drugs, or dietary supplements you use. Also tell them if you smoke, drink alcohol, or use illegal drugs. Some items may interact with your medicine. What should I watch for while using this medicine?  Do not stop taking this medicine without first talking to your doctor. Stopping this medicine may increase your risk of having a stroke. Be sure to refill your prescription before you run out of  medicine. This medicine may increase your risk to bruise or bleed. Call your doctor or health care professional if you notice any unusual bleeding. Be careful brushing and flossing your teeth or using a toothpick because you may bleed more easily. If you have any dental work done, tell your dentist you are receiving this medicine. What side effects may I notice from receiving this medicine? Side effects that you should report to your doctor or health care professional as soon as possible: -allergic reactions like skin rash, itching or hives, swelling of the face, lips, or tongue -bloody or black, tarry stools -changes in vision -confusion, trouble speaking or understanding -red or dark-brown urine -redness, blistering, peeling or loosening of the skin, including inside the mouth -severe headaches -spitting up blood or brown material that looks like coffee grounds -sudden numbness or weakness of the face, arm or leg -trouble walking, dizziness, loss of balance or coordination -unusual bruising or bleeding from the eye, gums, or nose  Side effects that usually do not require medical attention (Report these to your doctor or health care professional if they continue or are bothersome.): -dizziness -muscle pain This list may not describe all possible side effects. Call your doctor for medical advice about side effects. You may report side effects to FDA at 1-800-FDA-1088. Where should I keep my medicine? Keep out of the reach of children. Store at room temperature between 15 and 30 degrees C (59 and 86 degrees F). Throw away any unused medicine after the expiration date. NOTE: This sheet is a summary. It may not cover all possible information. If you have questions about this medicine, talk to your doctor, pharmacist, or health care provider.  2013, [employer] Standard. (10/09/2011 8:55:13 AM)

## 2013-06-10 ENCOUNTER — Other Ambulatory Visit: Payer: Self-pay

## 2013-06-17 ENCOUNTER — Encounter: Payer: Self-pay | Admitting: Oncology

## 2013-06-17 NOTE — Progress Notes (Signed)
[employer], [id number], approved temodar from 06/16/13-06/16/14.

## 2013-06-18 ENCOUNTER — Other Ambulatory Visit: Payer: Self-pay | Admitting: *Deleted

## 2013-06-18 DIAGNOSIS — I82409 Acute embolism and thrombosis of unspecified deep veins of unspecified lower extremity: Secondary | ICD-10-CM

## 2013-06-18 DIAGNOSIS — I10 Essential (primary) hypertension: Secondary | ICD-10-CM

## 2013-06-18 MED ORDER — WARFARIN SODIUM 10 MG PO TABS: 10.0000 mg | ORAL_TABLET | Freq: Every day | ORAL | Status: DC

## 2013-06-18 MED ORDER — LISINOPRIL 10 MG PO TABS: 10.0000 mg | ORAL_TABLET | Freq: Every day | ORAL | Status: DC

## 2013-06-21 ENCOUNTER — Telehealth: Payer: Self-pay | Admitting: Oncology

## 2013-06-21 ENCOUNTER — Other Ambulatory Visit (HOSPITAL_BASED_OUTPATIENT_CLINIC_OR_DEPARTMENT_OTHER): Payer: 59 | Admitting: Lab

## 2013-06-21 ENCOUNTER — Ambulatory Visit (HOSPITAL_BASED_OUTPATIENT_CLINIC_OR_DEPARTMENT_OTHER): Payer: 59

## 2013-06-21 ENCOUNTER — Other Ambulatory Visit: Payer: Self-pay | Admitting: *Deleted

## 2013-06-21 ENCOUNTER — Encounter: Payer: Self-pay | Admitting: Adult Health

## 2013-06-21 ENCOUNTER — Ambulatory Visit (HOSPITAL_BASED_OUTPATIENT_CLINIC_OR_DEPARTMENT_OTHER): Payer: 59 | Admitting: Adult Health

## 2013-06-21 ENCOUNTER — Encounter: Payer: Self-pay | Admitting: Oncology

## 2013-06-21 ENCOUNTER — Ambulatory Visit: Payer: 59

## 2013-06-21 VITALS — BP 112/78 | HR 83

## 2013-06-21 VITALS — BP 151/102 | HR 89 | Temp 97.4°F | Resp 20 | Ht 71.0 in | Wt 158.1 lb

## 2013-06-21 DIAGNOSIS — C711 Malignant neoplasm of frontal lobe: Secondary | ICD-10-CM

## 2013-06-21 DIAGNOSIS — I10 Essential (primary) hypertension: Secondary | ICD-10-CM

## 2013-06-21 DIAGNOSIS — I82409 Acute embolism and thrombosis of unspecified deep veins of unspecified lower extremity: Secondary | ICD-10-CM

## 2013-06-21 DIAGNOSIS — I2699 Other pulmonary embolism without acute cor pulmonale: Secondary | ICD-10-CM

## 2013-06-21 DIAGNOSIS — Z5112 Encounter for antineoplastic immunotherapy: Secondary | ICD-10-CM

## 2013-06-21 LAB — CBC WITH DIFFERENTIAL/PLATELET
BASO%: 0.2 % (ref 0.0–2.0)
Basophils Absolute: 0 10*3/uL (ref 0.0–0.1)
Eosinophils Absolute: 0.1 10*3/uL (ref 0.0–0.5)
HCT: 43.4 % (ref 38.4–49.9)
HGB: 15.1 g/dL (ref 13.0–17.1)
MCH: 34.3 pg — ABNORMAL HIGH (ref 27.2–33.4)
MCHC: 34.8 g/dL (ref 32.0–36.0)
MCV: 98.6 fL — ABNORMAL HIGH (ref 79.3–98.0)
MONO#: 0.3 10*3/uL (ref 0.1–0.9)
MONO%: 7.6 % (ref 0.0–14.0)
NEUT#: 3 10*3/uL (ref 1.5–6.5)
NEUT%: 66.8 % (ref 39.0–75.0)
Platelets: 133 10*3/uL — ABNORMAL LOW (ref 140–400)
WBC: 4.5 10*3/uL (ref 4.0–10.3)
lymph#: 1 10*3/uL (ref 0.9–3.3)

## 2013-06-21 LAB — UA PROTEIN, DIPSTICK - CHCC: Protein, ur: 100 mg/dL

## 2013-06-21 LAB — PROTIME-INR: INR: 2.6 (ref 2.00–3.50)

## 2013-06-21 MED ORDER — SODIUM CHLORIDE 0.9 % IV SOLN
Freq: Once | INTRAVENOUS | Status: AC
  Administered 2013-06-21: 15:00:00 via INTRAVENOUS

## 2013-06-21 MED ORDER — CLONIDINE HCL 0.1 MG PO TABS
0.2000 mg | ORAL_TABLET | Freq: Once | ORAL | Status: AC
  Administered 2013-06-21: 0.2 mg via ORAL

## 2013-06-21 MED ORDER — RIVAROXABAN 20 MG PO TABS: 20.0000 mg | ORAL_TABLET | Freq: Every day | ORAL | Status: DC

## 2013-06-21 MED ORDER — CLONIDINE HCL 0.1 MG PO TABS
ORAL_TABLET | ORAL | Status: AC
  Filled 2013-06-21: qty 2

## 2013-06-21 MED ORDER — SODIUM CHLORIDE 0.9 % IV SOLN
10.0000 mg/kg | Freq: Once | INTRAVENOUS | Status: AC
  Administered 2013-06-21: 750 mg via INTRAVENOUS
  Filled 2013-06-21: qty 30

## 2013-06-21 MED ORDER — HEPARIN SOD (PORK) LOCK FLUSH 100 UNIT/ML IV SOLN
500.0000 [IU] | Freq: Once | INTRAVENOUS | Status: AC | PRN
  Administered 2013-06-21: 500 [IU]
  Filled 2013-06-21: qty 5

## 2013-06-21 MED ORDER — SODIUM CHLORIDE 0.9 % IJ SOLN
10.0000 mL | INTRAMUSCULAR | Status: DC | PRN
  Administered 2013-06-21: 10 mL
  Filled 2013-06-21: qty 10

## 2013-06-21 NOTE — Patient Instructions (Signed)
[facility] Discharge Instructions for Patients Receiving Chemotherapy  Today you received the following chemotherapy agents Avastin.  To help prevent nausea and vomiting after your treatment, we encourage you to take your nausea medication as prescribed.   If you develop nausea and vomiting that is not controlled by your nausea medication, call the clinic.   BELOW ARE SYMPTOMS THAT SHOULD BE REPORTED IMMEDIATELY:  *FEVER GREATER THAN 100.5 F  *CHILLS WITH OR WITHOUT FEVER  NAUSEA AND VOMITING THAT IS NOT CONTROLLED WITH YOUR NAUSEA MEDICATION  *UNUSUAL SHORTNESS OF BREATH  *UNUSUAL BRUISING OR BLEEDING  TENDERNESS IN MOUTH AND THROAT WITH OR WITHOUT PRESENCE OF ULCERS  *URINARY PROBLEMS  *BOWEL PROBLEMS  UNUSUAL RASH Items with * indicate a potential emergency and should be followed up as soon as possible.  Feel free to call the clinic you have any questions or concerns. The clinic phone number is [phone].    

## 2013-06-21 NOTE — Progress Notes (Addendum)
OFFICE PROGRESS NOTE  Dr. 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  PRIOR THERAPY:  #1 patient underwent a resection of the brain tumor followed by concurrent chemotherapy and radiation therapy. The tumor chemotherapy consisted of Temodar 75 mg per meter squared.  #2 patient was then started on Temodar 150mg /meter squared given days 1 through 5 every 28 days with concomitant Avastin given every 2 weeks.  His last cycle of temodar was on 05/24/13.    #3 patient also developed lower extremity DVT as well as as pulmonary embolism he was started on Coumadin to try to maintain an INR between 2 and 2.5.  CURRENT THERAPY: Patient is here for his scheduled Avastin, every 2 weeks.    INTERVAL HISTORY: Roger Raymond 62 y.o. male returns for followup visit today for his treatment with Avastin today.  He continues to have mild headaches.  He has an MRI on Wednesday, 06/23/13 in the morning and then he will f/u with Dr. Sherwood Gambler at some point afterwards.  He otherwise denies weakness, numbness, easy bruising/bleeding ro any other concerns.  He has changed his mind about Xarelto and would like to transition to it from Coumadin.  A 10 point ROS is otherwise neg.   MEDICAL HISTORY: Past Medical History  Diagnosis Date  . 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 DVT (deep venous thrombosis) 07/08/2011    ALLERGIES:  has No Known Allergies.  MEDICATIONS:  Current Outpatient Prescriptions  Medication Sig Dispense Refill  . acetaminophen (TYLENOL) 325 MG tablet Take 650 mg by mouth every 6 (six) hours as needed.        . diphenoxylate-atropine (LOMOTIL) 2.5-0.025 MG per tablet Take 1 tablet by mouth 4 (four) times daily as needed. 1-2 tabs prn       . levETIRAcetam (KEPPRA) 500 MG tablet Take 500 mg by mouth 2  (two) times daily.        [name] lidocaine-prilocaine (EMLA) cream Apply topically as needed. Apply to port 1-2 hours before procedure  30 g  2  . lisinopril (PRINIVIL,ZESTRIL) 10 MG tablet Take 1 tablet (10 mg total) by mouth daily.  90 tablet  0  . ondansetron (ZOFRAN) 8 MG tablet Take 1 tablet (8 mg total) by mouth every 12 (twelve) hours as needed.  234 tablet  7  . pantoprazole (PROTONIX) 40 MG tablet Take 1 tablet (40 mg total) by mouth daily.  30 tablet  0  . sodium chloride (OCEAN) 0.65 % nasal spray Place 1 spray into the nose as needed.      . warfarin (COUMADIN) 10 MG tablet Take 1 tablet (10 mg total) by mouth daily.  90 tablet  0  . cloNIDine (CATAPRES) 0.1 MG tablet Take 1 tablet (0.1 mg total) by mouth 2 (two) times daily.  60 tablet  11  . enoxaparin (LOVENOX) 120 MG/0.8ML injection Inject 0.74 mLs (110 mg total) into the skin daily.  20 Syringe  0   No current facility-administered medications for this visit.    SURGICAL HISTORY:  Past Surgical History  Procedure Laterality Date  . Vasectomy      REVIEW OF SYSTEMS:  A 10 point review of systems was conducted and is otherwise negative except for what is noted above.     PHYSICAL EXAMINATION:  BP 151/102  Pulse 89  Temp(Src) 97.4 F (36.3 C) (Oral)  Resp 20  Ht 5\' 11"  (1.803 m)  Wt 158 lb 1.6 oz (71.714 kg)  BMI 22.06 kg/m2 General: Patient is a well appearing male in no acute distress HEENT: PERRLA, sclerae anicteric no conjunctival pallor, MMM Neck: supple, no palpable adenopathy Lungs: clear to auscultation bilaterally, no wheezes, rhonchi, or rales Cardiovascular: regular rate rhythm, S1, S2, no murmurs, rubs or gallops Abdomen: Soft, non-tender, non-distended, normoactive bowel sounds, no HSM Extremities: warm and well perfused, no clubbing, cyanosis, or edema Skin: No rashes or lesions Neuro: CN II-XII intact.  Normal gait, 5/5 strength in all extremities.   ECOG PERFORMANCE STATUS: 0 -  Asymptomatic    LABORATORY DATA: Lab Results  Component Value Date   WBC 4.5 06/21/2013   HGB 15.1 06/21/2013   HCT 43.4 06/21/2013   MCV 98.6* 06/21/2013   PLT 133* 06/21/2013      Chemistry      Component Value Date/Time   NA 140 06/07/2013 1004   NA 138 03/02/2012 1100   K 4.1 06/07/2013 1004   K 4.5 03/02/2012 1100   CL 109* 01/19/2013 1102   CL 102 03/02/2012 1100   CO2 26 06/07/2013 1004   CO2 23 03/02/2012 1100   BUN 15.6 06/07/2013 1004   BUN 25* 03/02/2012 1100   CREATININE 0.8 06/07/2013 1004   CREATININE 1.05 03/02/2012 1100      Component Value Date/Time   CALCIUM 9.0 06/07/2013 1004   CALCIUM 9.5 03/02/2012 1100   ALKPHOS 80 06/07/2013 1004   ALKPHOS 130* 03/02/2012 1100   AST 29 06/07/2013 1004   AST 39* 03/02/2012 1100   ALT 33 06/07/2013 1004   ALT 65* 03/02/2012 1100   BILITOT 0.40 06/07/2013 1004   BILITOT 0.4 03/02/2012 1100       RADIOGRAPHIC STUDIES:  No results found.  ASSESSMENT: 62 year old gentleman with:  1.  Glioblastoma multiforme he previously received Avastin every 2 weeks with Temodar days 1 through 5 on a 28 day cycle. He stopped temodar on 05/24/13.    2.  For his pulmonary embolus he is on Coumadin and he is  therapeutic with his INR.   #3 Hypertension--controlled on Lisinopril and HCTZ, He has clonidine to use prior to his treatment time if needed.    PLAN:  #1 Patient will proceed with scheduled Avastin today.  He has an MRI scheduled on Wednesday 06/23/13 in [place] and he will have an appointment with Dr. Sherwood Gambler after this.    #2 We will continue anti-hypertensive regimen and monitor his BP closely.  He will receive Clonidine today prior to treatment.    #3 His INR is 2.6 today.  He will continue Coumadin 10mg  daily.  I have ordered Xarelto, and we will discuss how to titrate from Coumadin at his next appointment.     #4 He will return in 2 weeks for labs, evaluation and his next Avastin appointment. He will be going to [place] 08/03/13  for the winter/early spring.  At that time he will likely be able to receive Avastin every 3 weeks.    All questions were answered. The patient knows to call the clinic with any problems, questions or concerns. We can certainly see the patient much sooner if necessary.  I spent >25 minutes counseling the patient face to face. The total time spent in the appointment was 30 minutes.    Minette Headland, [place] [phone] 06/21/2013, 1:48 PM  ATTENDING'S ATTESTATION:  I personally reviewed patient's chart, examined patient myself, formulated the treatment plan as followed.    Patient with history of GBM. He's been on Avastin. Tolerating it well. He is going to be getting MRI of the brain performed in the next few weeks. This is just has a followup. Clinically he seems to be doing well.  Patient also has history of left lower extremity DVT. He is currently on Coumadin. We have discussed the possibility of switching him to xeralto however we will hold off for now.  Marcy Panning, MD Medical/Oncology [facility] [phone] (beeper) [phone] (Office)  07/11/2013, 10:18 PM

## 2013-06-21 NOTE — Progress Notes (Signed)
Ok to treat today, 06/21/13, with urine protein equal to 100 per Dr. Welton Flakes. Angelena Form, RN

## 2013-06-28 ENCOUNTER — Telehealth: Payer: Self-pay | Admitting: Oncology

## 2013-06-28 ENCOUNTER — Telehealth: Payer: Self-pay | Admitting: *Deleted

## 2013-06-28 NOTE — Telephone Encounter (Signed)
, °

## 2013-06-28 NOTE — Telephone Encounter (Signed)
I can see him on 12.5 at 8:30

## 2013-06-28 NOTE — Telephone Encounter (Signed)
Message received from Mrs Groleau that pt is not doing well. Returned call from [place] pt had fainting spell over the weekend and she called Dr. Antony Contras office. Shirlee Limerick asked if there was anything they should know before pt comes back to see her in Dec at next visit? Discussed with pt's wife, I will relay message to MD. Shirlee Limerick requested to move appt from dec 1 to dec 5, transferred her to scheduling per her request. POF Sent

## 2013-06-28 NOTE — Telephone Encounter (Signed)
ok 

## 2013-06-28 NOTE — Telephone Encounter (Signed)
Call from Mrs. Schickling, she advised pt appt for dec 1 needs to be moved per Dr. Jule Ser as pt will have spinal tap on dec2.

## 2013-07-02 ENCOUNTER — Other Ambulatory Visit: Payer: Self-pay | Admitting: Emergency Medicine

## 2013-07-05 ENCOUNTER — Ambulatory Visit: Payer: 59 | Admitting: Adult Health

## 2013-07-05 ENCOUNTER — Other Ambulatory Visit: Payer: 59 | Admitting: Lab

## 2013-07-05 ENCOUNTER — Telehealth: Payer: Self-pay | Admitting: Oncology

## 2013-07-05 ENCOUNTER — Ambulatory Visit: Payer: 59

## 2013-07-05 ENCOUNTER — Other Ambulatory Visit: Payer: Self-pay | Admitting: Neurosurgery

## 2013-07-05 DIAGNOSIS — C7949 Secondary malignant neoplasm of other parts of nervous system: Secondary | ICD-10-CM

## 2013-07-05 NOTE — Telephone Encounter (Signed)
add to previous note. schedule mailed.

## 2013-07-05 NOTE — Telephone Encounter (Signed)
lmonvm for pt re appt for 12/5. per 11/28 pof 12/1 appts cx'd and moved to 12/5. appt w/KK schduled for 845am vs 830am due to lb meeitng.

## 2013-07-06 ENCOUNTER — Other Ambulatory Visit (HOSPITAL_COMMUNITY)
Admission: RE | Admit: 2013-07-06 | Discharge: 2013-07-06 | Disposition: A | Discharge status: Home / Self Care | Payer: 59 | Source: Ambulatory Visit | Attending: Neurosurgery | Admitting: Neurosurgery

## 2013-07-06 ENCOUNTER — Other Ambulatory Visit: Payer: 59

## 2013-07-06 ENCOUNTER — Ambulatory Visit
Admission: RE | Admit: 2013-07-06 | Discharge: 2013-07-06 | Disposition: A | Discharge status: Home / Self Care | Payer: 59 | Source: Ambulatory Visit | Attending: Neurosurgery | Admitting: Neurosurgery

## 2013-07-06 VITALS — BP 162/107 | HR 86

## 2013-07-06 DIAGNOSIS — C7949 Secondary malignant neoplasm of other parts of nervous system: Secondary | ICD-10-CM

## 2013-07-06 LAB — CSF CELL COUNT WITH DIFFERENTIAL
Eosinophils, CSF: 0 % (ref 0–1)
Lymphs, CSF: 81 % — ABNORMAL HIGH (ref 40–80)
Monocyte/Macrophage: 18 % (ref 15–45)
RBC Count, CSF: 0 cu mm
Segmented Neutrophils-CSF: 1 % (ref 0–6)
Tube #: 3
WBC, CSF: 3 cu mm (ref 0–5)

## 2013-07-06 LAB — GLUCOSE, CSF: Glucose, CSF: <10 mg/dL — ABNORMAL LOW (ref 43–76)

## 2013-07-06 LAB — PROTEIN, CSF: Total Protein, CSF: 506 mg/dL — ABNORMAL HIGH (ref 15–45)

## 2013-07-06 NOTE — Progress Notes (Signed)
Patient resting quietly on stretcher in nursing station after LP.  Wife at bedside.  Discharge instructions explained to both.  jkl

## 2013-07-08 ENCOUNTER — Telehealth: Payer: Self-pay | Admitting: Infectious Disease

## 2013-07-08 NOTE — Telephone Encounter (Signed)
Asher Muir can you set up a new patient appt for me with this man on this coming Tuesday or if Monday spot opens up that day. He is highly complicated pt of Dr. Earl Gala whom he prefers for me to see given that we have discussed his case together. Dr. Newell Coral wants him seen asap. I will also plan on discussing with Davonna Belling

## 2013-07-09 ENCOUNTER — Ambulatory Visit (HOSPITAL_BASED_OUTPATIENT_CLINIC_OR_DEPARTMENT_OTHER): Payer: 59 | Admitting: Oncology

## 2013-07-09 ENCOUNTER — Ambulatory Visit (HOSPITAL_BASED_OUTPATIENT_CLINIC_OR_DEPARTMENT_OTHER): Payer: 59 | Admitting: Lab

## 2013-07-09 ENCOUNTER — Ambulatory Visit: Payer: 59

## 2013-07-09 ENCOUNTER — Encounter: Payer: Self-pay | Admitting: Oncology

## 2013-07-09 ENCOUNTER — Other Ambulatory Visit: Payer: 59 | Admitting: Lab

## 2013-07-09 ENCOUNTER — Ambulatory Visit (HOSPITAL_COMMUNITY)
Admission: RE | Admit: 2013-07-09 | Discharge: 2013-07-09 | Disposition: A | Discharge status: Home / Self Care | Payer: 59 | Source: Ambulatory Visit | Attending: Oncology | Admitting: Oncology

## 2013-07-09 VITALS — BP 119/87 | HR 66 | Resp 18 | Ht 71.0 in | Wt 152.3 lb

## 2013-07-09 DIAGNOSIS — R5381 Other malaise: Secondary | ICD-10-CM | POA: Insufficient documentation

## 2013-07-09 DIAGNOSIS — R42 Dizziness and giddiness: Secondary | ICD-10-CM

## 2013-07-09 DIAGNOSIS — C711 Malignant neoplasm of frontal lobe: Secondary | ICD-10-CM

## 2013-07-09 DIAGNOSIS — M47817 Spondylosis without myelopathy or radiculopathy, lumbosacral region: Secondary | ICD-10-CM | POA: Insufficient documentation

## 2013-07-09 DIAGNOSIS — I2699 Other pulmonary embolism without acute cor pulmonale: Secondary | ICD-10-CM

## 2013-07-09 DIAGNOSIS — I82409 Acute embolism and thrombosis of unspecified deep veins of unspecified lower extremity: Secondary | ICD-10-CM

## 2013-07-09 DIAGNOSIS — K5730 Diverticulosis of large intestine without perforation or abscess without bleeding: Secondary | ICD-10-CM | POA: Insufficient documentation

## 2013-07-09 DIAGNOSIS — I10 Essential (primary) hypertension: Secondary | ICD-10-CM

## 2013-07-09 DIAGNOSIS — Z85841 Personal history of malignant neoplasm of brain: Secondary | ICD-10-CM | POA: Insufficient documentation

## 2013-07-09 DIAGNOSIS — R634 Abnormal weight loss: Secondary | ICD-10-CM | POA: Insufficient documentation

## 2013-07-09 DIAGNOSIS — R109 Unspecified abdominal pain: Secondary | ICD-10-CM | POA: Insufficient documentation

## 2013-07-09 LAB — CBC WITH DIFFERENTIAL/PLATELET
BASO%: 0.4 % (ref 0.0–2.0)
Basophils Absolute: 0 10*3/uL (ref 0.0–0.1)
LYMPH%: 18.9 % (ref 14.0–49.0)
MCHC: 35.1 g/dL (ref 32.0–36.0)
MONO#: 0.4 10*3/uL (ref 0.1–0.9)
Platelets: 188 10*3/uL (ref 140–400)
RBC: 4.9 10*6/uL (ref 4.20–5.82)
RDW: 13 % (ref 11.0–14.6)
WBC: 5.7 10*3/uL (ref 4.0–10.3)
lymph#: 1.1 10*3/uL (ref 0.9–3.3)
nRBC: 0 % (ref 0–0)

## 2013-07-09 LAB — COMPREHENSIVE METABOLIC PANEL (CC13)
AST: 35 U/L — ABNORMAL HIGH (ref 5–34)
Albumin: 4 g/dL (ref 3.5–5.0)
Alkaline Phosphatase: 124 U/L (ref 40–150)
BUN: 29.9 mg/dL — ABNORMAL HIGH (ref 7.0–26.0)
Calcium: 10.2 mg/dL (ref 8.4–10.4)
Glucose: 107 mg/dl (ref 70–140)
Potassium: 4.9 mEq/L (ref 3.5–5.1)
Total Bilirubin: 0.44 mg/dL (ref 0.20–1.20)

## 2013-07-09 LAB — UA PROTEIN, DIPSTICK - CHCC: Protein, ur: 30 mg/dL

## 2013-07-09 MED ORDER — IOHEXOL 300 MG/ML  SOLN
100.0000 mL | Freq: Once | INTRAMUSCULAR | Status: AC | PRN
  Administered 2013-07-09: 100 mL via INTRAVENOUS

## 2013-07-09 NOTE — Progress Notes (Signed)
OFFICE PROGRESS NOTE  Dr. 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  PRIOR THERAPY:  #1 patient underwent a resection of the brain tumor followed by concurrent chemotherapy and radiation therapy. The tumor chemotherapy consisted of Temodar 75 mg per meter squared.  #2 patient was then started on Temodar 150mg /meter squared given days 1 through 5 every 28 days with concomitant Avastin given every 2 weeks.  His last cycle of temodar was on 05/24/13.    #3 patient also developed lower extremity DVT as well as as pulmonary embolism he was started on Coumadin to try to maintain an INR between 2 and 2.5.  CURRENT THERAPY: ndergoing workup for possible recurrent disease  INTERVAL HISTORY: Roger Raymond 62 y.o. male returns for followup visit today. Unfortunately patient's recent MRI shows suspicious areas for possible disease recurrence. But is not typical for a GBM recurrence. Because of this patient had an LP performed which did not show any malignant cells but did show rare atypical cells. Glucose was low protein was high. Patient clinically seems to be a little bit more frail he is having some more dizziness he also states that he's been having some double vision. His wife tells me that he's been achy more so than he has been in the past. He has been eating but he has lost some weight. She also tells me that occasionally he has very weak muscles. He sometimes has gait disturbances. All of this makes me concerned that he indeed does have a recurrence. Further workup as below. MEDICAL HISTORY: Past Medical History  Diagnosis Date  . DVT (deep venous thrombosis) 07/08/2011  . 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  MEDICATIONS:  Current Outpatient Prescriptions   Medication Sig Dispense Refill  . acetaminophen (TYLENOL) 325 MG tablet Take 650 mg by mouth every 6 (six) hours as needed.        . cloNIDine (CATAPRES) 0.1 MG tablet Take 1 tablet (0.1 mg total) by mouth 2 (two) times daily.  60 tablet  11  . diphenoxylate-atropine (LOMOTIL) 2.5-0.025 MG per tablet Take 1 tablet by mouth 4 (four) times daily as needed. 1-2 tabs prn       . levETIRAcetam (KEPPRA) 500 MG tablet Take 500 mg by mouth 2 (two) times daily.        [name] lidocaine-prilocaine (EMLA) cream Apply topically as needed. Apply to port 1-2 hours before procedure  30 g  2  . lisinopril (PRINIVIL,ZESTRIL) 10 MG tablet Take 1 tablet (10 mg total) by mouth daily.  90 tablet  0  . ondansetron (ZOFRAN) 8 MG tablet Take 1 tablet (8 mg total) by mouth every 12 (twelve) hours as needed.  234 tablet  7  . pantoprazole (PROTONIX) 40 MG tablet Take 1 tablet (40 mg total) by mouth daily.  30 tablet  0  . sodium chloride (OCEAN) 0.65 % nasal spray Place 1 spray into the nose as needed.      . Rivaroxaban (XARELTO) 20 MG TABS tablet Take 1 tablet (20 mg total) by mouth daily with supper.  90 tablet  12  . warfarin (COUMADIN) 10 MG tablet Take 1 tablet (10 mg total) by mouth daily.  90 tablet  0   No current facility-administered medications for this visit.  SURGICAL HISTORY:  Past Surgical History  Procedure Laterality Date  . Vasectomy      REVIEW OF SYSTEMS:  A 10 point review of systems was conducted and is otherwise negative except for what is noted above.     PHYSICAL EXAMINATION:  BP 119/87  Pulse 66  Resp 18  Ht 5\' 11"  (1.803 m)  Wt 152 lb 4.8 oz (69.083 kg)  BMI 21.25 kg/m2 General: Patient is a well appearing male in no acute distress HEENT: PERRLA, sclerae anicteric no conjunctival pallor, MMM Neck: supple, no palpable adenopathy Lungs: clear to auscultation bilaterally, no wheezes, rhonchi, or rales Cardiovascular: regular rate rhythm, S1, S2, no murmurs, rubs or gallops Abdomen:  Soft, non-tender, non-distended, normoactive bowel sounds, no HSM Extremities: warm and well perfused, no clubbing, cyanosis, or edema Skin: No rashes or lesions Neuro: CN II-XII intact.  Normal gait, 5/5 strength in all extremities.   ECOG PERFORMANCE STATUS: 0 - Asymptomatic    LABORATORY DATA: Lab Results  Component Value Date   WBC 4.5 06/21/2013   HGB 15.1 06/21/2013   HCT 43.4 06/21/2013   MCV 98.6* 06/21/2013   PLT 133* 06/21/2013      Chemistry      Component Value Date/Time   NA 140 06/07/2013 1004   NA 138 03/02/2012 1100   K 4.1 06/07/2013 1004   K 4.5 03/02/2012 1100   CL 109* 01/19/2013 1102   CL 102 03/02/2012 1100   CO2 26 06/07/2013 1004   CO2 23 03/02/2012 1100   BUN 15.6 06/07/2013 1004   BUN 25* 03/02/2012 1100   CREATININE 0.8 06/07/2013 1004   CREATININE 1.05 03/02/2012 1100      Component Value Date/Time   CALCIUM 9.0 06/07/2013 1004   CALCIUM 9.5 03/02/2012 1100   ALKPHOS 80 06/07/2013 1004   ALKPHOS 130* 03/02/2012 1100   AST 29 06/07/2013 1004   AST 39* 03/02/2012 1100   ALT 33 06/07/2013 1004   ALT 65* 03/02/2012 1100   BILITOT 0.40 06/07/2013 1004   BILITOT 0.4 03/02/2012 1100       RADIOGRAPHIC STUDIES:  No results found.  ASSESSMENT: 62 year old gentleman with:  1.  Glioblastoma multiforme he previously received Avastin every 2 weeks with Temodar days 1 through 5 on a 28 day cycle. He stopped temodar on 05/24/13.  Most recently patient had a MRI of the brain performed by Dr. Jovita Gamma. There is suspicious areas for possible recurrence versus another process. Patient had an LP performed at revealed a glucose to be very low with elevated protein. Question is whether patient has infection versus relapse of GBM versus another process. Dr. Sherwood Gambler has been discussing the situation with Dr. Lucianne Lei down from infectious diseases. Patient is going to be seen by him. In the meantime I am going to go ahead and start workup for possible infectious disease  etiologies such as HIV. N. Other entities such as an antifungal panel.  #2 patient and I discussed the possibility of this being a different malignancy. I will plan on getting a CT of the chest abdomen and pelvis performed.  #3 we will hold Avastin today since patient may possibly be a getting another lumbar puncture performed.  2.  For his pulmonary embolus he is on Coumadin and he is  therapeutic with his INR.   #3 Hypertension--controlled on Lisinopril and HCTZ, He has clonidine to use prior to his treatment time if needed.    PLAN:   #1 proceed with CT of the  chest abdomen and pelvis.  #2 patient will be seen by infectious disease is Dr. Tommy Medal and possible we get another LP.  #3 we will draw HIV and antifungal testing. I am also recommended ana, rheumatoid factor  All questions were answered. The patient knows to call the clinic with any problems, questions or concerns. We can certainly see the patient much sooner if necessary.  I spent >25 minutes counseling the patient face to face. The total time spent in the appointment was 30 minutes.   Marcy Panning, MD Medical/Oncology [facility] [phone] (beeper) [phone] (Office)  07/09/2013, 9:27 AM

## 2013-07-13 ENCOUNTER — Encounter: Payer: Self-pay | Admitting: Infectious Disease

## 2013-07-13 ENCOUNTER — Other Ambulatory Visit: Payer: Self-pay | Admitting: Infectious Disease

## 2013-07-13 ENCOUNTER — Ambulatory Visit (INDEPENDENT_AMBULATORY_CARE_PROVIDER_SITE_OTHER): Payer: 59 | Admitting: Infectious Disease

## 2013-07-13 VITALS — BP 126/93 | HR 99 | Wt 151.0 lb

## 2013-07-13 DIAGNOSIS — G039 Meningitis, unspecified: Secondary | ICD-10-CM

## 2013-07-13 DIAGNOSIS — I2782 Chronic pulmonary embolism: Secondary | ICD-10-CM

## 2013-07-13 DIAGNOSIS — C719 Malignant neoplasm of brain, unspecified: Secondary | ICD-10-CM

## 2013-07-13 DIAGNOSIS — G0490 Encephalitis and encephalomyelitis, unspecified: Secondary | ICD-10-CM

## 2013-07-13 DIAGNOSIS — A878 Other viral meningitis: Secondary | ICD-10-CM

## 2013-07-13 DIAGNOSIS — R634 Abnormal weight loss: Secondary | ICD-10-CM

## 2013-07-13 NOTE — Progress Notes (Signed)
Subjective:    Patient ID: Roger Raymond, male    DOB: 1951-01-19, 62 y.o.   MRN: [id number]  HPI  62 year old man who was previously healthy until diagnosed with glioblastoma multi-forme sp resection approximately 2+ years ago on Avastin every 2 weeks  He also has a history of left lower extremity DVT with history of pulmonary emboli on chronic coumadin 10mg  daily.  Patient had MRI done with Neurosurgery recently suspicious for recurrence of GBM though not typical in appearance with some brainstem involvement.  Patient had high volume with 32 ML of CSF sent for cytology and this showed atypical cells but no malignancy.  CSF was also notable for protein >500 and glucose <10. His CSF wbc was 3 with 81% lymphs. No cultures were done.  He has had increasing malaise since October. He has noticed now that he cannot go for walk to walk there dog without his left leg turning outwards and "giving out:" He is sleeping frequently and his memory is becoming less reliable. He has NOT had headaches and denies fevers. Appetite is less good but still eating. He has no rashes, nausea or vomiting. He has had troubles with fine motor skills.  He and his wife have extensive travel history    Grew up in [place], [place], [place], [place] 8 years ago, [place], [place]. He enjoys fly fishing in particular and may have possibly been down wind of cattle or wildlife. He does not hunt. He has a 64 year old pet dog at home but no birds or exposure to birds.   He has had HIV EIA, CMV serologies, Toxo serologies done by Dr. Humphrey Rolls and these were all negative. Simiarly workup for WEgener's negative, and CT chest abd and pelvis negative  He is referred to [place] for workup of possible aseptic meningoencephalitis due to infection such as fungi, tb vs other causes but most concnering obviously being recurrent GBM with LM involvement.   Review of Systems  Constitutional: Positive for chills, activity change, fatigue  and unexpected weight change. Negative for fever, diaphoresis and appetite change.  HENT: Negative for congestion, rhinorrhea, sinus pressure, sneezing, sore throat and trouble swallowing.   Eyes: Negative for photophobia and visual disturbance.  Respiratory: Negative for cough, chest tightness, shortness of breath, wheezing and stridor.   Cardiovascular: Negative for chest pain, palpitations and leg swelling.  Gastrointestinal: Negative for nausea, vomiting, abdominal pain, diarrhea, constipation, blood in stool, abdominal distention and anal bleeding.  Endocrine: Positive for cold intolerance.  Genitourinary: Negative for dysuria, hematuria, flank pain and difficulty urinating.  Musculoskeletal: Negative for arthralgias, back pain, gait problem, joint swelling and myalgias.  Skin: Positive for pallor. Negative for color change, rash and wound.  Neurological: Positive for dizziness, weakness, light-headedness and numbness. Negative for tremors, seizures, syncope and speech difficulty.  Hematological: Negative for adenopathy. Does not bruise/bleed easily.  Psychiatric/Behavioral: Positive for sleep disturbance and decreased concentration. Negative for behavioral problems, confusion, dysphoric mood and agitation.       Objective:   Physical Exam  Constitutional: He is oriented to person, place, and time. No distress.  Underweight, slow to respond to questions.   HENT:  Head: Normocephalic and atraumatic.  Mouth/Throat: Oropharynx is clear and moist. No oropharyngeal exudate.  Eyes: Conjunctivae and EOM are normal. Pupils are equal, round, and reactive to light. No scleral icterus.  Neck: Normal range of motion. Neck supple. No JVD present.  Cardiovascular: Normal rate, regular rhythm and normal heart sounds.  Exam reveals no gallop and no friction rub.   No murmur heard. Pulmonary/Chest: Effort normal and breath sounds normal. No respiratory distress. He has no wheezes. He has no rales. He  exhibits no tenderness.  Abdominal: He exhibits no distension and no mass. There is no tenderness. There is no rebound and no guarding.  Musculoskeletal: He exhibits no edema and no tenderness.  Lymphadenopathy:    He has no cervical adenopathy.  Neurological: He is alert and oriented to person, place, and time. No cranial nerve deficit. He exhibits normal muscle tone. Coordination normal.  Skin: Skin is warm and dry. He is not diaphoretic. No erythema. No pallor.  Psychiatric: Judgment and thought content normal. He is slowed.          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 However we will work him up for other causes including fungal, tb, sarcoid, Neurocysticercosis, Bartonella, Brucella  I have coordianted with Rolla Flatten MD with Radiology and he will bring pt in tomorrow afternoon and perform LARGE volume LP  PLEASE DOCUMENT OPENING AND CLOSING PRESSURE  PLEASE OBTAIN AT [facility] CSF  #1 PLEASE SEND DEDICATED 10ML TO BE [place]  #2 PLEASE SEND  DEDICATED 10ML TO BE CENTRIFUGED AND HAVE THE SEDIMENT FROM THIS SPECIMEN SENT FOR AFB STAIN AND CULTURE  PLEASE SAVE ALL OF THE ABOVE SUPERNATANT FOR OTHER TESTS, PCRS, SEROLOGIES (SEE BELOW)  #3 Please send CSF for gram stain and culture  #4 Please send CSF for cell count, protein and glucose  #5 Please send CSF FOR STAT CRYPTOCOCCAL ANTIGEN  #6 we will also send CSF (supernatant from #1 and #2 above fine) for blastomyces antigen, histoplasma, cocci ag to [facility]., we will also check urine ag for all of these fungi  #7 I will send CSF for T. whipplei by PCR  #8we will send CSF for Neurocysticercosis antibodies and   #8 CSF for toxoplasmosis by PCR   #9 CSF for CMV by PCR and EBV by PCR  I am ok sending csf for cytology --BUT  I think the BULK of CSF should be used for ID workup and we already had negative cytology on 30+ ML of fluid  I will also send serum for RPR,  fungal ab panel, bartonella and brucella, ACE level, neurocysticercosis abs  I meant to send for Quantiferon gold but this can be checked later  I unfortunatlely think he will need to undergo leptomeningeal biopsy with pathology and specimens also for AFB and fungal culture to make diagnosis  I spent greater than 60 minutes with the patient including greater than 50% of time in face to face counsel of the patient and in coordination of their care.

## 2013-07-14 ENCOUNTER — Telehealth: Payer: Self-pay | Admitting: *Deleted

## 2013-07-14 ENCOUNTER — Ambulatory Visit: Payer: 59 | Admitting: Oncology

## 2013-07-14 ENCOUNTER — Other Ambulatory Visit: Payer: Self-pay | Admitting: Infectious Disease

## 2013-07-14 ENCOUNTER — Ambulatory Visit
Admission: RE | Admit: 2013-07-14 | Discharge: 2013-07-14 | Disposition: A | Discharge status: Home / Self Care | Payer: 59 | Source: Ambulatory Visit | Attending: Infectious Disease | Admitting: Infectious Disease

## 2013-07-14 ENCOUNTER — Other Ambulatory Visit: Payer: Self-pay | Admitting: Licensed Clinical Social Worker

## 2013-07-14 DIAGNOSIS — G039 Meningitis, unspecified: Secondary | ICD-10-CM

## 2013-07-14 DIAGNOSIS — A878 Other viral meningitis: Secondary | ICD-10-CM

## 2013-07-14 LAB — CSF CELL COUNT WITH DIFFERENTIAL
Eosinophils, CSF: 0 % (ref 0–1)
Monocyte/Macrophage: 47 % — ABNORMAL HIGH (ref 15–45)
RBC Count, CSF: 0 cu mm
Segmented Neutrophils-CSF: 0 % (ref 0–6)

## 2013-07-14 LAB — ANCA SCREEN W REFLEX TITER
Atypical p-ANCA Screen: NEGATIVE
p-ANCA Screen: NEGATIVE

## 2013-07-14 LAB — GLUCOSE, CSF: Glucose, CSF: <10 mg/dL — ABNORMAL LOW (ref 43–76)

## 2013-07-14 LAB — CMV DNA, QUANTITATIVE, PCR: Cytomegalovirus, DNA Quant PCR: <363 copies/mL (ref <363)

## 2013-07-14 LAB — HIV ANTIBODY (ROUTINE TESTING W REFLEX): HIV: NONREACTIVE

## 2013-07-14 LAB — CRYPTOCOCCAL ANTIGEN: Crypto Ag: NEGATIVE

## 2013-07-14 LAB — PROTEIN, CSF: Total Protein, CSF: 483 mg/dL — ABNORMAL HIGH (ref 15–45)

## 2013-07-14 LAB — CYTOMEGALOVIRUS ANTIBODY, IGG: Cytomegalovirus Ab-IgG: <0.2 U/mL (ref <0.60)

## 2013-07-14 LAB — ANTI-DNA ANTIBODY, DOUBLE-STRANDED: ds DNA Ab: 7 IU/mL (ref <30)

## 2013-07-14 LAB — RHEUMATOID FACTOR: Rhuematoid fact SerPl-aCnc: <10 IU/mL (ref <=14)

## 2013-07-14 NOTE — Telephone Encounter (Signed)
Patients wife notified

## 2013-07-14 NOTE — Telephone Encounter (Signed)
Patient's wife called and wanted Dr. Daiva Eves to know that her husband had previously been a heavy diet coke drinker for a long period in his life. She had read online something about aspartame poisoning and wanted to know if this is something that he should be checked for. Roger Raymond

## 2013-07-14 NOTE — Addendum Note (Signed)
Addended by: Randall Hiss on: 07/14/2013 04:13 PM   Modules accepted: Orders

## 2013-07-14 NOTE — Telephone Encounter (Signed)
That would NEVER do what we  Are seeing here. THis is unfortunately LIKELY recurrence of his cancer but it is possible he could have TB or fungal infection or something really weird like neurocysticercosis, or CMV infection but I am NOT counting on it. He is VERY likeyl going to need a biopsy by Dr. Newell Coral to prove what I am unfortunately conficent that he has

## 2013-07-14 NOTE — Addendum Note (Signed)
Addended by: Starleen Arms D on: 07/14/2013 03:55 PM   Modules accepted: Orders

## 2013-07-14 NOTE — Telephone Encounter (Signed)
We should know more the next day or so already with smears

## 2013-07-15 LAB — BARTONELLA ANTIBODY PANEL
B henselae IgG: NEGATIVE
B henselae IgM: NEGATIVE

## 2013-07-15 LAB — CRYPTOCOCCAL ANTIGEN, CSF: Crypto Ag: NEGATIVE

## 2013-07-16 LAB — VDRL, CSF

## 2013-07-16 LAB — CMV (CYTOMEGALOVIRUS) DNA ULTRAQUANT, PCR: CMV DNA Quant: <200 copies/mL (ref <200)

## 2013-07-17 LAB — CSF CULTURE W GRAM STAIN

## 2013-07-19 ENCOUNTER — Encounter: Payer: Self-pay | Admitting: Adult Health

## 2013-07-19 ENCOUNTER — Ambulatory Visit: Payer: 59

## 2013-07-19 ENCOUNTER — Ambulatory Visit (HOSPITAL_BASED_OUTPATIENT_CLINIC_OR_DEPARTMENT_OTHER): Payer: 59 | Admitting: Adult Health

## 2013-07-19 ENCOUNTER — Other Ambulatory Visit (HOSPITAL_BASED_OUTPATIENT_CLINIC_OR_DEPARTMENT_OTHER): Payer: 59

## 2013-07-19 VITALS — BP 139/85 | HR 123 | Temp 97.5°F | Resp 20 | Ht 71.0 in | Wt 148.0 lb

## 2013-07-19 DIAGNOSIS — I2699 Other pulmonary embolism without acute cor pulmonale: Secondary | ICD-10-CM

## 2013-07-19 DIAGNOSIS — C711 Malignant neoplasm of frontal lobe: Secondary | ICD-10-CM

## 2013-07-19 DIAGNOSIS — I10 Essential (primary) hypertension: Secondary | ICD-10-CM

## 2013-07-19 DIAGNOSIS — R627 Adult failure to thrive: Secondary | ICD-10-CM

## 2013-07-19 DIAGNOSIS — R911 Solitary pulmonary nodule: Secondary | ICD-10-CM

## 2013-07-19 LAB — CBC WITH DIFFERENTIAL/PLATELET
EOS%: 1.5 % (ref 0.0–7.0)
Eosinophils Absolute: 0.1 10*3/uL (ref 0.0–0.5)
HCT: 43.1 % (ref 38.4–49.9)
LYMPH%: 18.8 % (ref 14.0–49.0)
MCH: 34.2 pg — ABNORMAL HIGH (ref 27.2–33.4)
MONO#: 0.4 10*3/uL (ref 0.1–0.9)
MONO%: 7.5 % (ref 0.0–14.0)
NEUT#: 3.7 10*3/uL (ref 1.5–6.5)
Platelets: 170 10*3/uL (ref 140–400)
RBC: 4.42 10*6/uL (ref 4.20–5.82)
RDW: 12.8 % (ref 11.0–14.6)
WBC: 5.2 10*3/uL (ref 4.0–10.3)
lymph#: 1 10*3/uL (ref 0.9–3.3)
nRBC: 0 % (ref 0–0)

## 2013-07-19 LAB — HISTOPLASMA ANTIGEN, URINE: Histoplasma Antigen, urine: <0.5 ng/mL

## 2013-07-19 NOTE — Progress Notes (Addendum)
OFFICE PROGRESS NOTE  Dr. 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  PRIOR THERAPY:  #1 patient underwent a resection of the brain tumor followed by concurrent chemotherapy and radiation therapy. The tumor chemotherapy consisted of Temodar 75 mg per meter squared.  #2 patient was then started on Temodar 150mg /meter squared given days 1 through 5 every 28 days with concomitant Avastin given every 2 weeks.  His last cycle of temodar was on 05/24/13.    #3 patient also developed lower extremity DVT as well as as pulmonary embolism he was started on Coumadin to try to maintain an INR between 2 and 2.5.  CURRENT THERAPY: Undergoing workup for possible recurrent disease  INTERVAL HISTORY: Roger Raymond 62 y.o. male returns for followup visit today. He has continued to worsen since his last appointment with [place].  He is weaker, and has not been eating as well.  He continues to endorse double vision and blurred vision.  He has underwent consultation and evaluation by Dr. Tommy Medal.  A large volume lumbar puncture was performed with an infectious disease work up.  On my review of the results, no infectious etiology has resulted positive as of yet.  He is here with his wife, his daughter, and his son.  They are very frustrated with Mr. Bulthuis decline and want to find a definite and conclusive answer on an exact etiology of the MRI results.  In talking with [name], she says in retrospect he began getting weaker and slowly changing in October.  He has not had any new weakness, numbness, headaches, or any further concerns.    MEDICAL HISTORY: Past Medical History  Diagnosis Date  . DVT (deep venous thrombosis) 07/08/2011  . Malignant neoplasm of frontal lobe of brain     ALLERGIES:  has No Known  Allergies.  MEDICATIONS:  Current Outpatient Prescriptions  Medication Sig Dispense Refill  . acetaminophen (TYLENOL) 325 MG tablet Take 650 mg by mouth every 6 (six) hours as needed.        . cloNIDine (CATAPRES) 0.1 MG tablet Take 1 tablet (0.1 mg total) by mouth 2 (two) times daily.  60 tablet  11  . diphenoxylate-atropine (LOMOTIL) 2.5-0.025 MG per tablet Take 1 tablet by mouth 4 (four) times daily as needed. 1-2 tabs prn       . levETIRAcetam (KEPPRA) 500 MG tablet Take 500 mg by mouth 2 (two) times daily.        [name] lidocaine-prilocaine (EMLA) cream Apply topically as needed. Apply to port 1-2 hours before procedure  30 g  2  . lisinopril (PRINIVIL,ZESTRIL) 10 MG tablet Take 1 tablet (10 mg total) by mouth daily.  90 tablet  0  . ondansetron (ZOFRAN) 8 MG tablet Take 1 tablet (8 mg total) by mouth every 12 (twelve) hours as needed.  234 tablet  7  . pantoprazole (PROTONIX) 40 MG tablet Take 1 tablet (40 mg total) by mouth daily.  30 tablet  0  . sodium chloride (OCEAN) 0.65 % nasal spray Place 1 spray into the nose as needed.      . warfarin (COUMADIN) 10 MG tablet Take 1 tablet (10 mg total) by mouth daily.  90 tablet  0  . Rivaroxaban (XARELTO) 20 MG TABS tablet Take 1 tablet (20 mg total) by mouth daily with supper.  90 tablet  12   No current facility-administered medications for this visit.    SURGICAL HISTORY:  Past Surgical History  Procedure Laterality Date  . Vasectomy    . Brain surgery      REVIEW OF SYSTEMS:  A 10 point review of systems was conducted and is otherwise negative except for what is noted above.     PHYSICAL EXAMINATION:  BP 139/85  Pulse 123  Temp(Src) 97.5 F (36.4 C) (Oral)  Resp 20  Ht 5\' 11"  (1.803 m)  Wt 148 lb (67.132 kg)  BMI 20.65 kg/m2 General: Patient is an ill appearing male in no acute distress, has apparently lost weight and deconditioned since I have last evaluated him HEENT: PERRLA, sclerae anicteric no conjunctival pallor,  MMM Neck: supple, no palpable adenopathy Lungs: clear to auscultation bilaterally, no wheezes, rhonchi, or rales Cardiovascular: slightly tachycardic, regular rhythm, S1, S2, no murmurs, rubs or gallops Abdomen: Soft, non-tender, non-distended, normoactive bowel sounds, no HSM Extremities: warm and well perfused, no clubbing, cyanosis, or edema Skin: No rashes or lesions Neuro: CN II-XII intact.  Normal gait, 5/5 strength in all extremities.   ECOG PERFORMANCE STATUS: 0 - Asymptomatic    LABORATORY DATA: Lab Results  Component Value Date   WBC 5.2 07/19/2013   HGB 15.1 07/19/2013   HCT 43.1 07/19/2013   MCV 97.5 07/19/2013   PLT 170 07/19/2013      Chemistry      Component Value Date/Time   NA 137 07/09/2013 0833   NA 138 03/02/2012 1100   K 4.9 07/09/2013 0833   K 4.5 03/02/2012 1100   CL 109* 01/19/2013 1102   CL 102 03/02/2012 1100   CO2 28 07/09/2013 0833   CO2 23 03/02/2012 1100   BUN 29.9* 07/09/2013 0833   BUN 25* 03/02/2012 1100   CREATININE 1.6* 07/09/2013 0833   CREATININE 1.05 03/02/2012 1100      Component Value Date/Time   CALCIUM 10.2 07/09/2013 0833   CALCIUM 9.5 03/02/2012 1100   ALKPHOS 124 07/09/2013 0833   ALKPHOS 130* 03/02/2012 1100   AST 35* 07/09/2013 0833   AST 39* 03/02/2012 1100   ALT 54 07/09/2013 0833   ALT 65* 03/02/2012 1100   BILITOT 0.44 07/09/2013 0833   BILITOT 0.4 03/02/2012 1100       RADIOGRAPHIC STUDIES:  No results found.  ASSESSMENT: 62 year old gentleman with:  1.  Glioblastoma multiforme he previously received Avastin every 2 weeks with Temodar days 1 through 5 on a 28 day cycle. He stopped temodar on 05/24/13.  Most recently patient had a MRI of the brain performed by Dr. Shirlean Kelly. There is suspicious areas for possible recurrence versus another process. Patient had an LP performed at revealed a glucose to be very low with elevated protein. Question is whether patient has infection versus relapse of GBM versus another process.  Patient will follow up with Dr. Daiva Eves this week to discuss the results of the lumbar puncture and get a plan from him and Dr. Newell Coral.    #2 A CT of the chest abdomen and pelvis was performed to evaluate the possibility of another malignancy, it was negative for any cancer or metastases.   #3 we will hold Avastin today since the patient just had a lumbar puncture last week, and will possibly have neurosurgery in the future.    2.  For his pulmonary embolus he restarted his Coumadin on Saturday.    #3 Hypertension--controlled on Lisinopril and HCTZ, He  has clonidine to use prior to his treatment time if needed.    PLAN:   #1 We will not give the patient the Avastin today.    #2 Patient will follow up with Dr. Tommy Medal and Dr. Sherwood Gambler regarding any future testing, or surgical recommendations.    #3 For now we will keep his follow up appointment in 2 weeks, and change as his plan of care evolves.    All questions were answered. The patient knows to call the clinic with any problems, questions or concerns. We can certainly see the patient much sooner if necessary.  I spent 40 minutes counseling the patient face to face. The total time spent in the appointment was 60 minutes.   Minette Headland, [place] [phone] 07/20/2013, 8:34 AM   ATTENDING'S ATTESTATION:  I personally reviewed patient's chart, examined patient myself, formulated the treatment plan as followed.    Patient clinically detriorating, work up in progress for infection vs. Recurrent disease. He most likely has recurrence and will need need chemotherapy and avastin.  Extensive discussion regarding differential diagnosis and treatment options with the family and family.  Marcy Panning, MD Medical/Oncology [facility] [phone] (beeper) [phone] (Office)  07/30/2013, 11:29 AM

## 2013-07-20 ENCOUNTER — Ambulatory Visit (INDEPENDENT_AMBULATORY_CARE_PROVIDER_SITE_OTHER): Payer: 59 | Admitting: Infectious Disease

## 2013-07-20 ENCOUNTER — Encounter: Payer: Self-pay | Admitting: Infectious Disease

## 2013-07-20 VITALS — BP 126/91 | HR 102 | Ht 71.0 in | Wt 150.5 lb

## 2013-07-20 DIAGNOSIS — G039 Meningitis, unspecified: Secondary | ICD-10-CM

## 2013-07-20 DIAGNOSIS — A879 Viral meningitis, unspecified: Secondary | ICD-10-CM

## 2013-07-20 DIAGNOSIS — G030 Nonpyogenic meningitis: Secondary | ICD-10-CM

## 2013-07-20 DIAGNOSIS — C719 Malignant neoplasm of brain, unspecified: Secondary | ICD-10-CM

## 2013-07-20 DIAGNOSIS — G0490 Encephalitis and encephalomyelitis, unspecified: Secondary | ICD-10-CM

## 2013-07-20 LAB — CYSTICERCOSIS AB, IGG BY ELISA: Cysticercosis Ab, IgG: <0.9

## 2013-07-20 NOTE — Progress Notes (Signed)
Subjective:    Patient ID: Roger Raymond, male    DOB: 13-Jun-1951, 62 y.o.   MRN: [id number]  HPI   62 year old man who was previously healthy until diagnosed with glioblastoma multi-forme sp resection approximately 2+ years ago on Avastin every 2 weeks  He also has a history of left lower extremity DVT with history of pulmonary emboli on chronic coumadin 10mg  daily.  Patient had MRI done with Neurosurgery recently suspicious for recurrence of GBM though not typical in appearance with some brainstem involvement.  Patient had high volume with 32 ML of CSF sent for cytology and this showed atypical cells but no malignancy.  CSF was also notable for protein >500 and glucose <10. His CSF wbc was 3 with 81% lymphs. No cultures were done.  He has had increasing malaise since October. He has noticed now that he cannot go for walk to walk there dog without his left leg turning outwards and "giving out:" He is sleeping frequently and his memory is becoming less reliable. He has NOT had headaches and denies fevers. Appetite is less good but still eating. He has no rashes, nausea or vomiting. He has had troubles with fine motor skills.  He and his wife have extensive travel history    Grew up in [place], [place], [place], [place] 8 years ago, [place], [place]. He enjoys fly fishing in particular and may have possibly been down wind of cattle or wildlife. He does not hunt. He has a 75 year old pet dog at home but no birds or exposure to birds.   He has had HIV EIA, CMV serologies, Toxo serologies done by Dr. Humphrey Rolls and these were all negative. Simiarly workup for WEgener's negative, and CT chest abd and pelvis negative  He is referred to [place] for workup of possible aseptic meningoencephalitis due to infection such as fungi, tb vs other causes but most concnering obviously being recurrent GBM with LM involvement.   We arranged with Dr. Jola Baptist for large volume LP with >40 Ml of CSF obtained,  Opening pressure was normal at 12 and closing pressure 0.   CSF now showed 31 WBC predominantly lymphocytes --53% with some monocytes were 7%. Glucose remained less than 10 and protein was still able elevated at 483.  Gram stain was negative and cultures were negative at 3 days.  AFB stain done on sediment from large volume centrifuged sample 46ml from e the spinal fluid ( which was my request) was negative and cultures at one week NG  Fungal stain done on sediment from large volume centrifuged sample 72ml from e the spinal fluid ( which was my request) was negative and cultures at one week NG  Cytomegalovirus virus PCR from spinal fluid was negative at less than 200 copies Epstein-Barr virus spinal fluid PCR was also negative.  Cryptococcal antigen on spinal fluid was negative.  Spinal fluid VDRL was negative.  Toxoplasma PCR on CSF was negative and Toxo abs werer negative  HIV EIA was negative in serum  Angiotensin converting enzyme level and spinal fluid was negative.  I've also sent spinal fluid for PCR for Whipple's which is pending  I sent spinal fluid for histoplasma antigen and blasts are not my sees antigen.  Urine histoplasma antigen was negative urine Blastomyces antigen, cocc antigen is pending  Serum and CSF antibodies for neurocysticercosis are pending  Fungal antibody panel is pending  Bartonella ab panel was negative  Reviewed all these labs with patient and  his wife and his son. I told the patient I still unfortunately suspected that the likely diagnosis was recurrence of his malignancy. I told him that I thought that he was going to need a brain biopsy to come to a diagnosis.   I offered to do further testing for him including a serum quantiferon gold, serum RPR and serum coxiella ab test as well as his request for serum Lyme and CSF for LYme (though I think both highly unlikely) he did travel to [place] and [place] in Sept this would be very unusual  presentation absent EM and with such late presentation.      Review of Systems  Constitutional: Positive for chills, activity change, fatigue and unexpected weight change. Negative for fever, diaphoresis and appetite change.  HENT: Negative for congestion, rhinorrhea, sinus pressure, sneezing, sore throat and trouble swallowing.   Eyes: Negative for photophobia and visual disturbance.  Respiratory: Negative for cough, chest tightness, shortness of breath, wheezing and stridor.   Cardiovascular: Negative for chest pain, palpitations and leg swelling.  Gastrointestinal: Negative for nausea, vomiting, abdominal pain, diarrhea, constipation, blood in stool, abdominal distention and anal bleeding.  Endocrine: Positive for cold intolerance.  Genitourinary: Negative for dysuria, hematuria, flank pain and difficulty urinating.  Musculoskeletal: Negative for arthralgias, back pain, gait problem, joint swelling and myalgias.  Skin: Positive for pallor. Negative for color change, rash and wound.  Neurological: Positive for dizziness, weakness, light-headedness and numbness. Negative for tremors, seizures, syncope and speech difficulty.  Hematological: Negative for adenopathy. Does not bruise/bleed easily.  Psychiatric/Behavioral: Positive for sleep disturbance and decreased concentration. Negative for behavioral problems, confusion, dysphoric mood and agitation.       Objective:   Physical Exam  Constitutional: He is oriented to person, place, and time. No distress.  Underweight, slow to respond to questions.   HENT:  Head: Normocephalic and atraumatic.  Mouth/Throat: Oropharynx is clear and moist. No oropharyngeal exudate.  Eyes: Conjunctivae and EOM are normal. Pupils are equal, round, and reactive to light. No scleral icterus.  Neck: Normal range of motion. Neck supple. No JVD present.  Cardiovascular: Normal rate, regular rhythm and normal heart sounds.  Exam reveals no gallop and no friction  rub.   No murmur heard. Pulmonary/Chest: Effort normal and breath sounds normal. No respiratory distress. He has no wheezes. He has no rales. He exhibits no tenderness.  Abdominal: He exhibits no distension and no mass. There is no tenderness. There is no rebound and no guarding.  Musculoskeletal: He exhibits no edema and no tenderness.  Lymphadenopathy:    He has no cervical adenopathy.  Neurological: He is alert and oriented to person, place, and time. No cranial nerve deficit. He exhibits normal muscle tone. Coordination normal.  Skin: Skin is warm and dry. He is not diaphoretic. No erythema. No pallor.  Psychiatric: Judgment and thought content normal. He is slowed.          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 I am adding some additional serological tests including tests for Lyme, Coxiella and TB by quantiferon gold. I don't think any of these tests however want to help rule in or rule out the cause of his meningoencephalitis.  I will also try to add a spinal fluid Lyme PCR. I'll see what other possible fungal tests can be sent on spinal fluid such as, and fixing antibodies to Coccidioides, and antibodies to Coccidioides by immunodiffusion  The patient is  going to require a brain biopsy or leptomeningeal biopsy with pathology and specimens also for AFB and fungal culture to make diagnosis  Attempted to call Dr. Sherwood Gambler after the visit (whom the pt will see tomorrow) but  It was after 5pm and I received the on call MD I will call him tomorrow. Pt has been off coumadin past several days in anticipation of surgery  I spent greater than 40 minutes with the patient including greater than 50% of time in face to face counsel of the patient and in coordination of their care.

## 2013-07-21 ENCOUNTER — Telehealth: Payer: Self-pay | Admitting: Oncology

## 2013-07-21 ENCOUNTER — Other Ambulatory Visit: Payer: Self-pay | Admitting: Infectious Disease

## 2013-07-21 DIAGNOSIS — G0490 Encephalitis and encephalomyelitis, unspecified: Secondary | ICD-10-CM

## 2013-07-21 LAB — TROPHERYMA WHIPPLEI, DNA, PCR

## 2013-07-21 LAB — B. BURGDORFI ANTIBODIES: B burgdorferi Ab IgG+IgM: 0.34 {ISR}

## 2013-07-21 NOTE — Addendum Note (Signed)
Addended by: Mariea Clonts D on: 07/21/2013 03:14 PM   Modules accepted: Orders

## 2013-07-21 NOTE — Telephone Encounter (Signed)
, °

## 2013-07-22 ENCOUNTER — Ambulatory Visit (HOSPITAL_BASED_OUTPATIENT_CLINIC_OR_DEPARTMENT_OTHER): Payer: 59 | Admitting: Oncology

## 2013-07-22 ENCOUNTER — Other Ambulatory Visit: Payer: Self-pay | Admitting: Licensed Clinical Social Worker

## 2013-07-22 ENCOUNTER — Other Ambulatory Visit (HOSPITAL_BASED_OUTPATIENT_CLINIC_OR_DEPARTMENT_OTHER): Payer: 59

## 2013-07-22 ENCOUNTER — Encounter: Payer: Self-pay | Admitting: Oncology

## 2013-07-22 ENCOUNTER — Ambulatory Visit (HOSPITAL_BASED_OUTPATIENT_CLINIC_OR_DEPARTMENT_OTHER): Payer: 59

## 2013-07-22 VITALS — BP 129/90 | HR 109 | Temp 97.4°F | Resp 97 | Ht 71.0 in | Wt 148.0 lb

## 2013-07-22 DIAGNOSIS — C7949 Secondary malignant neoplasm of other parts of nervous system: Secondary | ICD-10-CM

## 2013-07-22 DIAGNOSIS — C711 Malignant neoplasm of frontal lobe: Secondary | ICD-10-CM

## 2013-07-22 DIAGNOSIS — Z5111 Encounter for antineoplastic chemotherapy: Secondary | ICD-10-CM

## 2013-07-22 DIAGNOSIS — I82409 Acute embolism and thrombosis of unspecified deep veins of unspecified lower extremity: Secondary | ICD-10-CM

## 2013-07-22 DIAGNOSIS — G039 Meningitis, unspecified: Secondary | ICD-10-CM

## 2013-07-22 LAB — COMPREHENSIVE METABOLIC PANEL (CC13)
ALT: 51 U/L (ref 0–55)
Albumin: 3.8 g/dL (ref 3.5–5.0)
Anion Gap: 8 mEq/L (ref 3–11)
BUN: 36.8 mg/dL — ABNORMAL HIGH (ref 7.0–26.0)
CO2: 27 mEq/L (ref 22–29)
Glucose: 139 mg/dl (ref 70–140)
Potassium: 3.9 mEq/L (ref 3.5–5.1)
Sodium: 138 mEq/L (ref 136–145)
Total Protein: 6.6 g/dL (ref 6.4–8.3)

## 2013-07-22 LAB — CBC WITH DIFFERENTIAL/PLATELET
Basophils Absolute: 0 10*3/uL (ref 0.0–0.1)
Eosinophils Absolute: 0.1 10*3/uL (ref 0.0–0.5)
HGB: 15.4 g/dL (ref 13.0–17.1)
MCV: 96.9 fL (ref 79.3–98.0)
MONO#: 0.5 10*3/uL (ref 0.1–0.9)
MONO%: 8.9 % (ref 0.0–14.0)
NEUT#: 3.4 10*3/uL (ref 1.5–6.5)
NEUT%: 65.1 % (ref 39.0–75.0)
RDW: 12.7 % (ref 11.0–14.6)
WBC: 5.3 10*3/uL (ref 4.0–10.3)
lymph#: 1.3 10*3/uL (ref 0.9–3.3)
nRBC: 0 % (ref 0–0)

## 2013-07-22 LAB — MVISTA BLASTOMYCES QNT AG, URINE

## 2013-07-22 LAB — QUANTIFERON TB GOLD ASSAY (BLOOD)
Mitogen value: >10 IU/mL
Quantiferon Nil Value: 0.02 IU/mL
Quantiferon Tb Ag Minus Nil Value: 0 IU/mL
TB Ag value: 0.02 IU/mL

## 2013-07-22 MED ORDER — SODIUM CHLORIDE 0.9 % IV SOLN
346.5000 mg | Freq: Once | INTRAVENOUS | Status: AC
  Administered 2013-07-22: 350 mg via INTRAVENOUS
  Filled 2013-07-22: qty 35

## 2013-07-22 MED ORDER — SODIUM CHLORIDE 0.9 % IJ SOLN
10.0000 mL | INTRAMUSCULAR | Status: DC | PRN
  Administered 2013-07-22: 10 mL
  Filled 2013-07-22: qty 10

## 2013-07-22 MED ORDER — ONDANSETRON HCL 8 MG PO TABS: 16.0000 mg | ORAL_TABLET | Freq: Two times a day (BID) | ORAL | Status: DC

## 2013-07-22 MED ORDER — LORAZEPAM 0.5 MG PO TABS: 0.5000 mg | ORAL_TABLET | Freq: Four times a day (QID) | ORAL | Status: DC | PRN

## 2013-07-22 MED ORDER — DEXAMETHASONE SODIUM PHOSPHATE 20 MG/5ML IJ SOLN
20.0000 mg | Freq: Once | INTRAMUSCULAR | Status: AC
  Administered 2013-07-22: 20 mg via INTRAVENOUS

## 2013-07-22 MED ORDER — SODIUM CHLORIDE 0.9 % IV SOLN
10.0000 mg/kg | Freq: Once | INTRAVENOUS | Status: AC
  Administered 2013-07-22: 750 mg via INTRAVENOUS
  Filled 2013-07-22: qty 30

## 2013-07-22 MED ORDER — ONDANSETRON 16 MG/50ML IVPB (CHCC)
16.0000 mg | Freq: Once | INTRAVENOUS | Status: AC
  Administered 2013-07-22: 16 mg via INTRAVENOUS

## 2013-07-22 MED ORDER — SODIUM CHLORIDE 0.9 % IV SOLN
100.0000 mg/m2 | Freq: Once | INTRAVENOUS | Status: AC
  Administered 2013-07-22: 180 mg via INTRAVENOUS
  Filled 2013-07-22: qty 9

## 2013-07-22 MED ORDER — SODIUM CHLORIDE 0.9 % IV SOLN
Freq: Once | INTRAVENOUS | Status: AC
  Administered 2013-07-22: 12:00:00 via INTRAVENOUS

## 2013-07-22 MED ORDER — HEPARIN SOD (PORK) LOCK FLUSH 100 UNIT/ML IV SOLN
500.0000 [IU] | Freq: Once | INTRAVENOUS | Status: AC | PRN
  Administered 2013-07-22: 500 [IU]
  Filled 2013-07-22: qty 5

## 2013-07-22 MED ORDER — ONDANSETRON 16 MG/50ML IVPB (CHCC)
INTRAVENOUS | Status: AC
  Filled 2013-07-22: qty 16

## 2013-07-22 MED ORDER — DEXAMETHASONE SODIUM PHOSPHATE 20 MG/5ML IJ SOLN
INTRAMUSCULAR | Status: AC
  Filled 2013-07-22: qty 5

## 2013-07-22 MED ORDER — PROCHLORPERAZINE MALEATE 10 MG PO TABS: 10.0000 mg | ORAL_TABLET | Freq: Four times a day (QID) | ORAL | Status: DC | PRN

## 2013-07-22 MED ORDER — DEXAMETHASONE 4 MG PO TABS: 8.0000 mg | ORAL_TABLET | Freq: Two times a day (BID) | ORAL | Status: DC

## 2013-07-22 NOTE — Progress Notes (Signed)
OFFICE PROGRESS NOTE  Dr. 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  PRIOR THERAPY:  #1 patient underwent a resection of the brain tumor followed by concurrent chemotherapy and radiation therapy. The tumor chemotherapy consisted of Temodar 75 mg per meter squared.  #2 patient was then started on Temodar 150mg /meter squared given days 1 through 5 every 28 days with concomitant Avastin given every 2 weeks.  His last cycle of temodar was on 05/24/13.    #3 patient also developed lower extremity DVT as well as as pulmonary embolism he was started on Coumadin to try to maintain an INR between 2 and 2.5.  CURRENT THERAPY: Palliative chemotherapy consisting of Avastin/carboplatinum/etoposide days one through 3  INTERVAL HISTORY: Roger NARDOZZI 62 y.o. male returns for followup visit today. He has been seen by Dr. Sherwood Gambler as well as Dr. Tommy Medal. Patient has had a full workup for infectious leptomeningeal disease. Thus far the workup is unrevealing. 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 Patient and I discussed this extensively. At this time recommendation is to proceed with chemotherapy. We will proceed with Avastin carboplatinum and etoposide. Risks benefits and side effects of chemotherapy were explained to the patient. As well as his family. We also discussed possibility of doing intrathecal chemotherapy. I will try to have this arranged in the next 2 weeks or so.  MEDICAL HISTORY: Past Medical History  Diagnosis Date  . DVT (deep venous thrombosis) 07/08/2011  . 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  MEDICATIONS:  Current Outpatient Prescriptions  Medication Sig Dispense Refill  . acetaminophen  (TYLENOL) 325 MG tablet Take 650 mg by mouth every 6 (six) hours as needed.        . cloNIDine (CATAPRES) 0.1 MG tablet Take 1 tablet (0.1 mg total) by mouth 2 (two) times daily.  60 tablet  11  . dexamethasone (DECADRON) 4 MG tablet Take 2 tablets (8 mg total) by mouth 2 (two) times daily with a meal. Take two times a day starting the day after chemotherapy for 3 days.  30 tablet  1  . diphenoxylate-atropine (LOMOTIL) 2.5-0.025 MG per tablet Take 1 tablet by mouth 4 (four) times daily as needed. 1-2 tabs prn       . levETIRAcetam (KEPPRA) 500 MG tablet Take 500 mg by mouth 2 (two) times daily.        [name] lidocaine-prilocaine (EMLA) cream Apply topically as needed. Apply to port 1-2 hours before procedure  30 g  2  . lisinopril (PRINIVIL,ZESTRIL) 10 MG tablet Take 1 tablet (10 mg total) by mouth daily.  90 tablet  0  . LORazepam (ATIVAN) 0.5 MG tablet Take 1 tablet (0.5 mg total) by mouth every 6 (six) hours as needed (Nausea or vomiting).  30 tablet  1  . ondansetron (ZOFRAN) 8 MG tablet Take 1 tablet (8 mg total) by mouth every 12 (twelve) hours as needed.  234 tablet  7  . ondansetron (ZOFRAN) 8 MG tablet Take 2 tablets (16 mg total) by mouth 2 (two) times daily. Take two times a day starting the day after chemo for 3 days. Then take two times a day as needed for nausea or vomiting.  30 tablet  1  .  pantoprazole (PROTONIX) 40 MG tablet Take 1 tablet (40 mg total) by mouth daily.  30 tablet  0  . prochlorperazine (COMPAZINE) 10 MG tablet Take 1 tablet (10 mg total) by mouth every 6 (six) hours as needed (Nausea or vomiting).  30 tablet  1  . Rivaroxaban (XARELTO) 20 MG TABS tablet Take 1 tablet (20 mg total) by mouth daily with supper.  90 tablet  12  . sodium chloride (OCEAN) 0.65 % nasal spray Place 1 spray into the nose as needed.      . warfarin (COUMADIN) 10 MG tablet Take 1 tablet (10 mg total) by mouth daily.  90 tablet  0   No current facility-administered medications for this visit.    Facility-Administered Medications Ordered in Other Visits  Medication Dose Route Frequency Provider Last Rate Last Dose  . sodium chloride 0.9 % injection 10 mL  10 mL Intracatheter PRN Deatra Robinson, MD   10 mL at 07/22/13 1516    SURGICAL HISTORY:  Past Surgical History  Procedure Laterality Date  . Vasectomy    . Brain surgery      REVIEW OF SYSTEMS:  A 10 point review of systems was conducted and is otherwise negative except for what is noted above.     PHYSICAL EXAMINATION:  BP 129/90  Pulse 109  Temp(Src) 97.4 F (36.3 C) (Oral)  Resp 97  Ht 5\' 11"  (1.803 m)  Wt 148 lb (67.132 kg)  BMI 20.65 kg/m2 General: Patient is an ill appearing male in no acute distress, has apparently lost weight and deconditioned since I have last evaluated him HEENT: PERRLA, sclerae anicteric no conjunctival pallor, MMM Neck: supple, no palpable adenopathy Lungs: clear to auscultation bilaterally, no wheezes, rhonchi, or rales Cardiovascular: slightly tachycardic, regular rhythm, S1, S2, no murmurs, rubs or gallops Abdomen: Soft, non-tender, non-distended, normoactive bowel sounds, no HSM Extremities: warm and well perfused, no clubbing, cyanosis, or edema Skin: No rashes or lesions Neuro: CN II-XII intact.  Normal gait, 5/5 strength in all extremities.   ECOG PERFORMANCE STATUS: 0 - Asymptomatic    LABORATORY DATA: Lab Results  Component Value Date   WBC 5.3 07/22/2013   HGB 15.4 07/22/2013   HCT 43.9 07/22/2013   MCV 96.9 07/22/2013   PLT 172 07/22/2013      Chemistry      Component Value Date/Time   NA 138 07/22/2013 0841   NA 138 03/02/2012 1100   K 3.9 07/22/2013 0841   K 4.5 03/02/2012 1100   CL 109* 01/19/2013 1102   CL 102 03/02/2012 1100   CO2 27 07/22/2013 0841   CO2 23 03/02/2012 1100   BUN 36.8* 07/22/2013 0841   BUN 25* 03/02/2012 1100   CREATININE 1.0 07/22/2013 0841   CREATININE 1.05 03/02/2012 1100      Component Value Date/Time   CALCIUM 9.8 07/22/2013  0841   CALCIUM 9.5 03/02/2012 1100   ALKPHOS 97 07/22/2013 0841   ALKPHOS 130* 03/02/2012 1100   AST 29 07/22/2013 0841   AST 39* 03/02/2012 1100   ALT 51 07/22/2013 0841   ALT 65* 03/02/2012 1100   BILITOT 0.77 07/22/2013 0841   BILITOT 0.4 03/02/2012 1100       RADIOGRAPHIC STUDIES:  No results found.  ASSESSMENT: 62 year old gentleman with:  1.  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 Patient's overall clinical status is deteriorating. He has had a full workup for infectious etiology which is unrevealing. 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 Was recommended patient proceed with chemotherapy. We also discussed referring him to a tertiary care center such as [facility] or [facility]. At this time patient has declined referral I would like to be treated here.  #2 I have recommended starting him on initially with Avastin/carboplatinum/etoposide days one through 3. He will receive first cycle today. Avastin and carboplatinum will be given every 3 weeks.  #3 based on some of the literature we may do intrathecal chemotherapy with methotrexate. However the timing has to be such that he is about week out from his Avastin infusion.  #4 risks benefits and side effects of treatment were discussed with the patient as well as his family. They understand this is all palliative care. They have demonstrated understanding of the goals of therapy. In that would like to proceed with his treatment.  PLAN:   #1 proceed with cycle 1 day 1 of Avastin and carboplatinum with the etoposide given days 1 through 2 with this cycle. He will return on Saturday for Neulasta.  #2 we will plan on doing intrathecal methotrexate on 08/02/2013. This will be about 2 weeks after he has received Avastin in 1 week before his next cycle of chemotherapy.  #3 patient will return in one week's time for followup as well as labs.  #4 next cycle of chemotherapy will be on 08/11/2013.  All  questions were answered. The patient knows to call the clinic with any problems, questions or concerns. We can certainly see the patient much sooner if necessary.  I spent 40 minutes counseling the patient face to face. The total time spent in the appointment was 60 minutes.   Drue Second, MD Medical/Oncology [facility] [phone] (beeper) [phone] (Office)  07/22/2013, 4:32 PM

## 2013-07-22 NOTE — Addendum Note (Signed)
Addended by: Mariea Clonts D on: 07/22/2013 10:43 AM   Modules accepted: Orders

## 2013-07-22 NOTE — Patient Instructions (Signed)
[facility] Discharge Instructions for Patients Receiving Chemotherapy  Today you received the following chemotherapy agents:  Avastin, Carboplatin and Etoposide.  To help prevent nausea and vomiting after your treatment, we encourage you to take your nausea medication as ordered per MD.   If you develop nausea and vomiting that is not controlled by your nausea medication, call the clinic.   BELOW ARE SYMPTOMS THAT SHOULD BE REPORTED IMMEDIATELY:  *FEVER GREATER THAN 100.5 F  *CHILLS WITH OR WITHOUT FEVER  NAUSEA AND VOMITING THAT IS NOT CONTROLLED WITH YOUR NAUSEA MEDICATION  *UNUSUAL SHORTNESS OF BREATH  *UNUSUAL BRUISING OR BLEEDING  TENDERNESS IN MOUTH AND THROAT WITH OR WITHOUT PRESENCE OF ULCERS  *URINARY PROBLEMS  *BOWEL PROBLEMS  UNUSUAL RASH Items with * indicate a potential emergency and should be followed up as soon as possible.  Feel free to call the clinic you have any questions or concerns. The clinic phone number is [phone].

## 2013-07-23 ENCOUNTER — Other Ambulatory Visit: Payer: Self-pay | Admitting: Adult Health

## 2013-07-23 ENCOUNTER — Telehealth: Payer: Self-pay | Admitting: Oncology

## 2013-07-23 ENCOUNTER — Telehealth: Payer: Self-pay | Admitting: *Deleted

## 2013-07-23 ENCOUNTER — Ambulatory Visit (HOSPITAL_BASED_OUTPATIENT_CLINIC_OR_DEPARTMENT_OTHER): Payer: 59

## 2013-07-23 VITALS — BP 134/99 | HR 89 | Temp 97.0°F | Resp 18

## 2013-07-23 DIAGNOSIS — C711 Malignant neoplasm of frontal lobe: Secondary | ICD-10-CM

## 2013-07-23 DIAGNOSIS — Z5111 Encounter for antineoplastic chemotherapy: Secondary | ICD-10-CM

## 2013-07-23 LAB — OTHER LAB RESULT

## 2013-07-23 MED ORDER — SODIUM CHLORIDE 0.9 % IV SOLN
Freq: Once | INTRAVENOUS | Status: AC
  Administered 2013-07-23: 13:00:00 via INTRAVENOUS

## 2013-07-23 MED ORDER — PROCHLORPERAZINE MALEATE 10 MG PO TABS
ORAL_TABLET | ORAL | Status: AC
  Filled 2013-07-23: qty 1

## 2013-07-23 MED ORDER — SODIUM CHLORIDE 0.9 % IV SOLN
100.0000 mg/m2 | Freq: Once | INTRAVENOUS | Status: AC
  Administered 2013-07-23: 180 mg via INTRAVENOUS
  Filled 2013-07-23: qty 9

## 2013-07-23 MED ORDER — HEPARIN SOD (PORK) LOCK FLUSH 100 UNIT/ML IV SOLN
500.0000 [IU] | Freq: Once | INTRAVENOUS | Status: AC | PRN
  Administered 2013-07-23: 500 [IU]
  Filled 2013-07-23: qty 5

## 2013-07-23 MED ORDER — SODIUM CHLORIDE 0.9 % IJ SOLN
10.0000 mL | INTRAMUSCULAR | Status: DC | PRN
  Administered 2013-07-23: 10 mL
  Filled 2013-07-23: qty 10

## 2013-07-23 MED ORDER — PROCHLORPERAZINE MALEATE 10 MG PO TABS
10.0000 mg | ORAL_TABLET | Freq: Once | ORAL | Status: AC
  Administered 2013-07-23: 10 mg via ORAL

## 2013-07-23 NOTE — Telephone Encounter (Signed)
Per scheduler I have rescheduled appt for 12/29 to 12/26

## 2013-07-23 NOTE — Telephone Encounter (Signed)
On 07-23-13 fax medical records to [facility] per susan boyles , it was notes, xray reports, path , [facility] , and [facility] going [facility].

## 2013-07-23 NOTE — Telephone Encounter (Signed)
lmonvm advising the pt that his 08/02/2013 appts have been r/s to 07/30/2013@11 :15am

## 2013-07-23 NOTE — Patient Instructions (Addendum)
[facility] Discharge Instructions for Patients Receiving Chemotherapy  Today you received the following chemotherapy agents Etoposide.  To help prevent nausea and vomiting after your treatment, we encourage you to take your nausea medication as prescribed.   Decadron 8 mg twice daily X 3 days (start today)  Zofran 8 mg twice daily X 3 days (start today), then twice daily as needed  Compazine 10mg  every 6 hours as needed  Ativan 0.5 mg every 6 hours as needed  If you develop nausea and vomiting that is not controlled by your nausea medication, call the clinic.   BELOW ARE SYMPTOMS THAT SHOULD BE REPORTED IMMEDIATELY:  *FEVER GREATER THAN 100.5 F  *CHILLS WITH OR WITHOUT FEVER  NAUSEA AND VOMITING THAT IS NOT CONTROLLED WITH YOUR NAUSEA MEDICATION  *UNUSUAL SHORTNESS OF BREATH  *UNUSUAL BRUISING OR BLEEDING  TENDERNESS IN MOUTH AND THROAT WITH OR WITHOUT PRESENCE OF ULCERS  *URINARY PROBLEMS  *BOWEL PROBLEMS  UNUSUAL RASH Items with * indicate a potential emergency and should be followed up as soon as possible.  It was a pleasure to serve you today!  Feel free to call the clinic should you have any questions or concerns. The clinic phone number is [phone].  It has been a pleasure to serve you today!

## 2013-07-24 ENCOUNTER — Ambulatory Visit (HOSPITAL_BASED_OUTPATIENT_CLINIC_OR_DEPARTMENT_OTHER): Payer: 59

## 2013-07-24 VITALS — BP 133/99 | HR 110 | Temp 98.0°F | Resp 18

## 2013-07-24 DIAGNOSIS — C711 Malignant neoplasm of frontal lobe: Secondary | ICD-10-CM

## 2013-07-24 DIAGNOSIS — Z5189 Encounter for other specified aftercare: Secondary | ICD-10-CM

## 2013-07-24 MED ORDER — PEGFILGRASTIM INJECTION 6 MG/0.6ML
6.0000 mg | Freq: Once | SUBCUTANEOUS | Status: AC
  Administered 2013-07-24: 6 mg via SUBCUTANEOUS

## 2013-07-24 NOTE — Patient Instructions (Signed)

## 2013-07-25 LAB — BORRELIA SPECIES DNA, FLUID, PCR: B. burgdorferi DNA: NOT DETECTED

## 2013-07-26 ENCOUNTER — Telehealth: Payer: Self-pay | Admitting: Oncology

## 2013-07-26 ENCOUNTER — Encounter: Payer: Self-pay | Admitting: Adult Health

## 2013-07-26 ENCOUNTER — Telehealth: Payer: Self-pay | Admitting: *Deleted

## 2013-07-26 ENCOUNTER — Encounter (HOSPITAL_COMMUNITY): Payer: Self-pay | Admitting: Pharmacy Technician

## 2013-07-26 NOTE — Telephone Encounter (Signed)
lmonvm for pt re next appt for 12/26 and mailed schedule. Per [facility] 12/26 tx cx'd. S/w Alecia in central scheduling and central is aware or LP/Intrathecal chemo order for 12/29 and will call pt.

## 2013-07-26 NOTE — Progress Notes (Signed)
This encounter was created in error - please disregard.

## 2013-07-26 NOTE — Telephone Encounter (Signed)
PT. IS EATING AND DRINKING FAIR. HE HAS HAD SOME REFLUX AND HICCOUGHS WHICH MEDICATION RELIEVES. PT. STILL HAS DOUBLE VISION. HIS MAIN COMPLAINT IS FATIGUE. NO QUESTIONS AT THIS TIME. REMINDED PT.'S WIFE TO CALL THIS OFFICE OR THE PHYSICIAN ON CALL FOR ANY PROBLEMS OR QUESTIONS. SHE VOICES UNDERSTANDING.

## 2013-07-30 ENCOUNTER — Ambulatory Visit (HOSPITAL_BASED_OUTPATIENT_CLINIC_OR_DEPARTMENT_OTHER): Payer: 59

## 2013-07-30 ENCOUNTER — Telehealth: Payer: Self-pay

## 2013-07-30 ENCOUNTER — Other Ambulatory Visit: Payer: Self-pay | Admitting: Oncology

## 2013-07-30 ENCOUNTER — Ambulatory Visit (HOSPITAL_BASED_OUTPATIENT_CLINIC_OR_DEPARTMENT_OTHER): Payer: 59 | Admitting: Adult Health

## 2013-07-30 ENCOUNTER — Ambulatory Visit: Payer: 59

## 2013-07-30 ENCOUNTER — Telehealth: Payer: Self-pay | Admitting: Oncology

## 2013-07-30 ENCOUNTER — Other Ambulatory Visit (HOSPITAL_BASED_OUTPATIENT_CLINIC_OR_DEPARTMENT_OTHER): Payer: 59

## 2013-07-30 ENCOUNTER — Encounter: Payer: Self-pay | Admitting: Adult Health

## 2013-07-30 VITALS — BP 107/79 | HR 112 | Temp 97.4°F | Resp 18 | Ht 71.0 in | Wt 140.4 lb

## 2013-07-30 DIAGNOSIS — D6959 Other secondary thrombocytopenia: Secondary | ICD-10-CM

## 2013-07-30 DIAGNOSIS — C711 Malignant neoplasm of frontal lobe: Secondary | ICD-10-CM

## 2013-07-30 DIAGNOSIS — R5381 Other malaise: Secondary | ICD-10-CM

## 2013-07-30 DIAGNOSIS — E860 Dehydration: Secondary | ICD-10-CM

## 2013-07-30 DIAGNOSIS — K649 Unspecified hemorrhoids: Secondary | ICD-10-CM

## 2013-07-30 DIAGNOSIS — R634 Abnormal weight loss: Secondary | ICD-10-CM

## 2013-07-30 DIAGNOSIS — H532 Diplopia: Secondary | ICD-10-CM

## 2013-07-30 DIAGNOSIS — I10 Essential (primary) hypertension: Secondary | ICD-10-CM

## 2013-07-30 LAB — CBC WITH DIFFERENTIAL/PLATELET
Basophils Absolute: 0 10*3/uL (ref 0.0–0.1)
Eosinophils Absolute: 0.1 10*3/uL (ref 0.0–0.5)
HCT: 45.5 % (ref 38.4–49.9)
HGB: 15.5 g/dL (ref 13.0–17.1)
LYMPH%: 28.7 % (ref 14.0–49.0)
MCV: 101 fL — ABNORMAL HIGH (ref 79.3–98.0)
MONO%: 15.6 % — ABNORMAL HIGH (ref 0.0–14.0)
NEUT#: 2.2 10*3/uL (ref 1.5–6.5)
NEUT%: 52.6 % (ref 39.0–75.0)
Platelets: 61 10*3/uL — ABNORMAL LOW (ref 140–400)
WBC: 4.2 10*3/uL (ref 4.0–10.3)

## 2013-07-30 LAB — OTHER SOLSTAS TEST

## 2013-07-30 LAB — COMPREHENSIVE METABOLIC PANEL (CC13)
Alkaline Phosphatase: 149 U/L (ref 40–150)
BUN: 37.6 mg/dL — ABNORMAL HIGH (ref 7.0–26.0)
Creatinine: 1 mg/dL (ref 0.7–1.3)
Glucose: 128 mg/dl (ref 70–140)
Total Bilirubin: 1.61 mg/dL — ABNORMAL HIGH (ref 0.20–1.20)

## 2013-07-30 LAB — M. TUBERCULOSIS COMPLEX BY PCR: M. tuberculosis, Direct: NOT DETECTED

## 2013-07-30 MED ORDER — SODIUM CHLORIDE 0.9 % IV SOLN
Freq: Once | INTRAVENOUS | Status: AC
  Administered 2013-07-30: 14:00:00 via INTRAVENOUS

## 2013-07-30 MED ORDER — SODIUM CHLORIDE 0.9 % IJ SOLN
10.0000 mL | INTRAMUSCULAR | Status: DC | PRN
  Administered 2013-07-30: 10 mL via INTRAVENOUS
  Filled 2013-07-30: qty 10

## 2013-07-30 MED ORDER — DEXAMETHASONE 4 MG PO TABS: 4.0000 mg | ORAL_TABLET | Freq: Three times a day (TID) | ORAL | Status: DC

## 2013-07-30 MED ORDER — DEXAMETHASONE 4 MG PO TABS: 4.0000 mg | ORAL_TABLET | Freq: Three times a day (TID) | ORAL | Status: AC

## 2013-07-30 MED ORDER — DEXAMETHASONE SODIUM PHOSPHATE 10 MG/ML IJ SOLN
10.0000 mg | Freq: Once | INTRAMUSCULAR | Status: AC
  Administered 2013-07-30: 10 mg via INTRAVENOUS

## 2013-07-30 MED ORDER — HEPARIN SOD (PORK) LOCK FLUSH 100 UNIT/ML IV SOLN
500.0000 [IU] | Freq: Once | INTRAVENOUS | Status: AC
  Administered 2013-07-30: 500 [IU] via INTRAVENOUS
  Filled 2013-07-30: qty 5

## 2013-07-30 MED ORDER — DEXAMETHASONE SODIUM PHOSPHATE 10 MG/ML IJ SOLN
INTRAMUSCULAR | Status: AC
  Filled 2013-07-30: qty 1

## 2013-07-30 NOTE — Patient Instructions (Signed)
Dehydration, Adult Dehydration is when you lose more fluids from the body than you take in. Vital organs like the kidneys, brain, and heart cannot function without a proper amount of fluids and salt. Any loss of fluids from the body can cause dehydration.  CAUSES   Vomiting.  Diarrhea.  Excessive sweating.  Excessive urine output.  Fever. SYMPTOMS  Mild dehydration  Thirst.  Dry lips.  Slightly dry mouth. Moderate dehydration  Very dry mouth.  Sunken eyes.  Skin does not bounce back quickly when lightly pinched and released.  Dark urine and decreased urine production.  Decreased tear production.  Headache. Severe dehydration  Very dry mouth.  Extreme thirst.  Rapid, weak pulse (more than 100 beats per minute at rest).  Cold hands and feet.  Not able to sweat in spite of heat and temperature.  Rapid breathing.  Blue lips.  Confusion and lethargy.  Difficulty being awakened.  Minimal urine production.  No tears. DIAGNOSIS  Your caregiver will diagnose dehydration based on your symptoms and your exam. Blood and urine tests will help confirm the diagnosis. The diagnostic evaluation should also identify the cause of dehydration. TREATMENT  Treatment of mild or moderate dehydration can often be done at home by increasing the amount of fluids that you drink. It is best to drink small amounts of fluid more often. Drinking too much at one time can make vomiting worse. Refer to the home care instructions below. Severe dehydration needs to be treated at the hospital where you will probably be given intravenous (IV) fluids that contain water and electrolytes. HOME CARE INSTRUCTIONS   Ask your caregiver about specific rehydration instructions.  Drink enough fluids to keep your urine clear or pale yellow.  Drink small amounts frequently if you have nausea and vomiting.  Eat as you normally do.  Avoid:  Foods or drinks high in sugar.  Carbonated  drinks.  Juice.  Extremely hot or cold fluids.  Drinks with caffeine.  Fatty, greasy foods.  Alcohol.  Tobacco.  Overeating.  Gelatin desserts.  Wash your hands well to avoid spreading bacteria and viruses.  Only take over-the-counter or prescription medicines for pain, discomfort, or fever as directed by your caregiver.  Ask your caregiver if you should continue all prescribed and over-the-counter medicines.  Keep all follow-up appointments with your caregiver. SEEK MEDICAL CARE IF:  You have abdominal pain and it increases or stays in one area (localizes).  You have a rash, stiff neck, or severe headache.  You are irritable, sleepy, or difficult to awaken.  You are weak, dizzy, or extremely thirsty. SEEK IMMEDIATE MEDICAL CARE IF:   You are unable to keep fluids down or you get worse despite treatment.  You have frequent episodes of vomiting or diarrhea.  You have blood or green matter (bile) in your vomit.  You have blood in your stool or your stool looks black and tarry.  You have not urinated in 6 to 8 hours, or you have only urinated a small amount of very dark urine.  You have a fever.  You faint. MAKE SURE YOU:   Understand these instructions.  Will watch your condition.  Will get help right away if you are not doing well or get worse. Document Released: 07/22/2005 Document Revised: 10/14/2011 Document Reviewed: 03/11/2011 [employer] Patient Information 2014 [employer].  

## 2013-07-30 NOTE — Telephone Encounter (Signed)
lmonvm advising the pt of his jan 2015 appts.

## 2013-07-30 NOTE — Telephone Encounter (Signed)
pt came by and got appt calendar for January 2015

## 2013-07-30 NOTE — Telephone Encounter (Signed)
Spoke with radiology scheduler and cancelled intrathecal chemotherapy for 08-02-13 per Dr. Darrold Span.

## 2013-07-30 NOTE — Progress Notes (Addendum)
OFFICE PROGRESS NOTE  Dr. 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  PRIOR THERAPY:  #1 patient underwent a resection of the brain tumor followed by concurrent chemotherapy and radiation therapy. The tumor chemotherapy consisted of Temodar 75 mg per meter squared.  #2 patient was then started on Temodar 150mg /meter squared given days 1 through 5 every 28 days with concomitant Avastin given every 2 weeks.  His last cycle of temodar was on 05/24/13.   #3 patient also developed lower extremity DVT as well as as pulmonary embolism he was started on Coumadin to try to maintain an INR between 2 and 2.5.  (Coumadin currently on hold).    #4  Patient underwent MRI and evaluation with Dr. Sherwood Gambler in November, 2014.  MRI was concerning for GBM recurrence, but appeared atypical.  He underwent two lumbar punctures, an infectious disease consult, and the areas of enhancement on MRI were attributed to the GBM.    CURRENT THERAPY: Palliative chemotherapy consisting of Avastin/carboplatinum/etoposide days one through 3  INTERVAL HISTORY: Roger Raymond 62 y.o. male returns for followup visit today. He received Avastin/Carboplatinum/Etoposide last week.  He is feeling weaker today.  He is with his wife and two sons.  They are concerned because they feel as if he is continuing to decline. His double vision is worse, he has lost 8 pounds, he is weaker and unable to walk as much, is short of breath while walking.  He denies any worsening headaches or numbness, any chest pain.  He does cough occasionally, and his wife has noticed that it has happened after meals.  It is becoming more difficult for him to swallow his pills.  He has not taken any coumadin recently due to his upcoming Lumbar puncture planned for 08/02/13.  He is also not  taking Clonidine as listed with his medications.  He denies fevers, chills, nausea, vomiting, constipation, diarrhea, numbness or any further concerns.    MEDICAL HISTORY: Past Medical History  Diagnosis Date  . DVT (deep venous thrombosis) 07/08/2011  . 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  MEDICATIONS:  Current Outpatient Prescriptions  Medication Sig Dispense Refill  . acetaminophen (TYLENOL) 325 MG tablet Take 650 mg by mouth every 6 (six) hours as needed for mild pain or moderate pain.       . diphenoxylate-atropine (LOMOTIL) 2.5-0.025 MG per tablet Take 1-2 tablets by mouth 4 (four) times daily as needed for diarrhea or loose stools. 1-2 tabs prn      . levETIRAcetam (KEPPRA) 500 MG tablet Take 500 mg by mouth 2 (two) times daily.       [name] lidocaine-prilocaine (EMLA) cream Apply 1 application topically as needed. Apply to port 1-2 hours before procedure      . LORazepam (ATIVAN) 0.5 MG tablet Take 0.5 mg by mouth every 6 (six) hours as needed (Nausea or vomiting).      . ondansetron (ZOFRAN) 8 MG tablet Take 8 mg by mouth every 12 (twelve) hours as needed for nausea.      . ondansetron (ZOFRAN) 8 MG tablet Take 16 mg by mouth 2 (two) times daily. Take two times a day starting the day after chemo for 3 days. Then take two times a day as needed for nausea or vomiting.      [name]  pantoprazole (PROTONIX) 40 MG tablet Take 40 mg by mouth daily as needed (heartburn).      . prochlorperazine (COMPAZINE) 10 MG tablet Take 10 mg by mouth every 6 (six) hours as needed (Nausea or vomiting).      . sodium chloride (OCEAN) 0.65 % nasal spray Place 1 spray into the nose as needed for congestion.       [name] dexamethasone (DECADRON) 4 MG tablet Take 1 tablet (4 mg total) by mouth 3 (three) times daily.  90 tablet  2   No current facility-administered medications for this visit.   Facility-Administered Medications Ordered in Other Visits  Medication Dose Route  Frequency Provider Last Rate Last Dose  . sodium chloride 0.9 % injection 10 mL  10 mL Intravenous PRN Deatra Robinson, MD   10 mL at 07/30/13 1521    SURGICAL HISTORY:  Past Surgical History  Procedure Laterality Date  . Vasectomy    . Brain surgery      REVIEW OF SYSTEMS:  A 10 point review of systems was conducted and is otherwise negative except for what is noted above.     PHYSICAL EXAMINATION:  BP 107/79  Pulse 112  Temp(Src) 97.4 F (36.3 C) (Axillary)  Resp 18  Ht 5\' 11"  (1.803 m)  Wt 140 lb 6.4 oz (63.685 kg)  BMI 19.59 kg/m2 General: Patient is an ill appearing male in no acute distress, cachectic HEENT: PERRLA, sclerae anicteric no conjunctival pallor, MMM Neck: supple, no palpable adenopathy Lungs: clear to auscultation bilaterally, no wheezes, rhonchi, or rales Cardiovascular: slightly tachycardic, regular rhythm, S1, S2, no murmurs, rubs or gallops Abdomen: Soft, non-tender, non-distended, normoactive bowel sounds, no HSM Extremities: warm and well perfused, no clubbing, cyanosis, or edema Skin: No rashes or lesions Neuro: CN II-XII intact, strength 5/5 in upper extremities, 3/5 in lower extremities.    ECOG PERFORMANCE STATUS: 0 - Asymptomatic    LABORATORY DATA: Lab Results  Component Value Date   WBC 4.2 07/30/2013   HGB 15.5 07/30/2013   HCT 45.5 07/30/2013   MCV 101.0* 07/30/2013   PLT 61* 07/30/2013      Chemistry      Component Value Date/Time   NA 138 07/30/2013 1125   NA 138 03/02/2012 1100   K 4.2 07/30/2013 1125   K 4.5 03/02/2012 1100   CL 109* 01/19/2013 1102   CL 102 03/02/2012 1100   CO2 26 07/30/2013 1125   CO2 23 03/02/2012 1100   BUN 37.6* 07/30/2013 1125   BUN 25* 03/02/2012 1100   CREATININE 1.0 07/30/2013 1125   CREATININE 1.05 03/02/2012 1100      Component Value Date/Time   CALCIUM 9.9 07/30/2013 1125   CALCIUM 9.5 03/02/2012 1100   ALKPHOS 149 07/30/2013 1125   ALKPHOS 130* 03/02/2012 1100   AST 34 07/30/2013 1125   AST  39* 03/02/2012 1100   ALT 141* 07/30/2013 1125   ALT 65* 03/02/2012 1100   BILITOT 1.61* 07/30/2013 1125   BILITOT 0.4 03/02/2012 1100       RADIOGRAPHIC STUDIES:  No results found.  ASSESSMENT: 62 year old gentleman with:  1.  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 Patient's overall clinical status is deteriorating. He has had a full workup for infectious etiology which is unrevealing. 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 Was recommended patient proceed with chemotherapy. We also discussed referring him to a tertiary care center such as [facility] or [facility]. At this time patient has declined  referral I would like to be treated here.  #2 I have recommended starting him on initially with Avastin/carboplatinum/etoposide days one through 3. He will receive first cycle today. Avastin and carboplatinum will be given every 3 weeks.  #3 It was originally planned for the patient to undergo IT methotrexate on day 15 of treatment, however, due to his thrombocytopenia we will hold this.  Chemo related thrombocytopenia.  #4 risks benefits and side effects of treatment were discussed with the patient as well as his family. They understand this is all palliative care. They have demonstrated understanding of the goals of therapy.   PLAN:   #1 Roger Raymond is ill appearing since his last visit.  He tolerated the chemotherapy essentially well with no fevers, nausea, vomiting, constipation, diarrhea, however, he does appear to be declining.  He has lost weight and is now weak to the point that he is in a wheelchair.  I reviewed his CBC with him.  He is thrombocytopenic with platelets of 61.  I reviewed this with Dr. Darrold Span.  We will cancel his lumbar puncture on Monday 08/02/13.    #2  Due to his weight loss, and decreased PO intake I will give him IV fluids.  He will receive one liter of normal saline in the treatment room today.    #3 Since his diplopia is worse, he  will receive Dexamethasone 10mg  IV in the treatment room, he will then start on Dexamethasone 4mg  PO TID.    #4 Due to the difficulty with swallowing his pills, I recommended that he use applesauce if needed.  Additionally, I ordered a swallow study.    #5  I discussed his decline and my concern for the poor prognosis with his wife.  We discussed his illness, prognosis, and advanced directives.  This discussion is ongoing.    #6  He will return next week for labs and evaluation.  His wife knows to call if he continues to decline over the weekend or into next week.   All questions were answered. The patient knows to call the clinic with any problems, questions or concerns. We can certainly see the patient much sooner if necessary.  I spent 40 minutes counseling the patient face to face. The total time spent in the appointment was 60 minutes.   Illa Level, NP Medical Oncology [facility] [phone] 07/30/2013, 3:32 PM  Addendum: patient discussed with NP and seen/ examined by this MD, covering for Dr.Khan. Wife and 2 sons accompany. Day 9 cycle 1 carbo VP16 given with neulasta. Due IT MTX ~ 12-29/  Patient is progressively more weak, requiring assistance with all ADLs, which wife will not be able to manage by herself when sons are no longer available after holidays. We have located forms for assistance with sitter, which I have completed now. He is not taking po's well,complaining of difficulty swallowing, was able to drink a fruit smoothie yesterday, has had just some sips of water and a few bites today. We will give IVF now and set up swallowing study.  He denies HA and does not seem to have new focal deficits. He has had no bleeding, is off coumadin for planned IT MTX (which will be held due to platelets 61k today). He has been extremely uncomfortable from hemorrhoids, and we have discussed trying proctofoam HC, TUCKS pads and cleaning with sitz bath or hand held shower.  He is off clonidine and (?) did not take lisinopril this AM; with BP  lower he does not need the antihypertensives now. Wife tells me that he has Advance Directives completed and she will bring to office to be copied and scanned into EMR.  On exam: ill appearing, cachectic gentleman seated in WC, respirations not labored RA. Awake, conversation mostly appropriate, looks generally uncomfortable but NAD. Wife appears stressed and exhausted. PERRL, not icteric. Oral mucosa moist (just sipping water) without thrush, posterior pharynx clear, no pooling secretions apparent. No coughing. Breath sounds heard bilaterally, diminished in bases bilaterally, no wheezes or crackles. Heart RRR. Abdomen few bowel sounds, not distended, soft. LE minimal edema, no cords or tenderness. No ecchymoses, petechiae. PAC site ok. Neuro: speech fluent, moves all extremities in WC.   Labs noted, with platelets 61k today.  We will add decadron 4 mg tid due to the progressive GBM. IVF today. Local measures for hemorrhoids. Swallowing study. Counts may drop further in next week related to chemo, will see him with counts in next week. Hold IT MTX with thrombocytopenia. Family may need more assistance in near future. Note advance directives done.  Godfrey Pick, MD

## 2013-08-02 ENCOUNTER — Other Ambulatory Visit: Payer: Self-pay | Admitting: Emergency Medicine

## 2013-08-02 ENCOUNTER — Other Ambulatory Visit: Payer: 59

## 2013-08-02 ENCOUNTER — Telehealth: Payer: Self-pay | Admitting: *Deleted

## 2013-08-02 ENCOUNTER — Ambulatory Visit (HOSPITAL_COMMUNITY): Admission: RE | Admit: 2013-08-02 | Payer: 59 | Source: Ambulatory Visit

## 2013-08-02 ENCOUNTER — Ambulatory Visit: Payer: 59 | Admitting: Adult Health

## 2013-08-02 ENCOUNTER — Ambulatory Visit: Payer: 59

## 2013-08-02 DIAGNOSIS — C711 Malignant neoplasm of frontal lobe: Secondary | ICD-10-CM

## 2013-08-02 MED ORDER — HYDROCORTISONE ACE-PRAMOXINE 1-1 % RE FOAM: 1.0000 | Freq: Two times a day (BID) | RECTAL | Status: AC

## 2013-08-02 NOTE — Telephone Encounter (Signed)
Called pt this am to F/U from Friday's appointment. Spoke to wife. Wife said patient is still weak, not eating much but she has been able to get some fluids in. Patient has SOB when walking. No complaints of headaches or numbness, but does have double vision. Coughing has ceased but having some hiccups. Encouraged her to keep up the fluids and  I will call tomorrow as well to ck on him. Message to be forwarded to Larna Daughters, NP.

## 2013-08-03 ENCOUNTER — Ambulatory Visit (HOSPITAL_BASED_OUTPATIENT_CLINIC_OR_DEPARTMENT_OTHER): Payer: 59 | Admitting: Oncology

## 2013-08-03 ENCOUNTER — Ambulatory Visit (HOSPITAL_BASED_OUTPATIENT_CLINIC_OR_DEPARTMENT_OTHER): Payer: 59

## 2013-08-03 ENCOUNTER — Encounter: Payer: Self-pay | Admitting: Oncology

## 2013-08-03 ENCOUNTER — Other Ambulatory Visit (HOSPITAL_BASED_OUTPATIENT_CLINIC_OR_DEPARTMENT_OTHER): Payer: 59

## 2013-08-03 VITALS — BP 95/65 | HR 77 | Temp 97.8°F | Resp 16 | Wt 139.4 lb

## 2013-08-03 DIAGNOSIS — Z86718 Personal history of other venous thrombosis and embolism: Secondary | ICD-10-CM

## 2013-08-03 DIAGNOSIS — R634 Abnormal weight loss: Secondary | ICD-10-CM

## 2013-08-03 DIAGNOSIS — C711 Malignant neoplasm of frontal lobe: Secondary | ICD-10-CM

## 2013-08-03 LAB — COMPREHENSIVE METABOLIC PANEL (CC13)
Albumin: 3.8 g/dL (ref 3.5–5.0)
Anion Gap: 14 mEq/L — ABNORMAL HIGH (ref 3–11)
CO2: 25 mEq/L (ref 22–29)
Calcium: 10.1 mg/dL (ref 8.4–10.4)
Chloride: 97 mEq/L — ABNORMAL LOW (ref 98–109)
Glucose: 128 mg/dl (ref 70–140)
Sodium: 136 mEq/L (ref 136–145)
Total Bilirubin: 0.87 mg/dL (ref 0.20–1.20)
Total Protein: 6.8 g/dL (ref 6.4–8.3)

## 2013-08-03 LAB — CBC WITH DIFFERENTIAL/PLATELET
BASO%: 0.1 % (ref 0.0–2.0)
Eosinophils Absolute: 0 10*3/uL (ref 0.0–0.5)
HCT: 43.7 % (ref 38.4–49.9)
HGB: 15.6 g/dL (ref 13.0–17.1)
LYMPH%: 8.7 % — ABNORMAL LOW (ref 14.0–49.0)
MCV: 95 fL (ref 79.3–98.0)
MONO#: 0.8 10*3/uL (ref 0.1–0.9)
NEUT#: 8.3 10*3/uL — ABNORMAL HIGH (ref 1.5–6.5)
NEUT%: 82.9 % — ABNORMAL HIGH (ref 39.0–75.0)
Platelets: 58 10*3/uL — ABNORMAL LOW (ref 140–400)
RBC: 4.6 10*6/uL (ref 4.20–5.82)
WBC: 10 10*3/uL (ref 4.0–10.3)

## 2013-08-03 MED ORDER — SODIUM CHLORIDE 0.9 % IV SOLN
Freq: Once | INTRAVENOUS | Status: AC
  Administered 2013-08-03: 13:00:00 via INTRAVENOUS

## 2013-08-03 NOTE — Progress Notes (Signed)
OFFICE PROGRESS NOTE   08/03/2013   Physicians: Jovita Gamma, Priscille Heidelberg, Consuello Masse (PCP, Ledell Noss), J.Kinard  INTERVAL HISTORY:  Patient is seen in Dr Laurelyn Sickle absence, together with wife and one son, in short term follow up of concerns from visit with APP and this MD on 07-30-13. He is day 13 cycle 1 carbo VP16 (x2 days) + Avastin and neulasta. He was a bit stronger for 1-2 days after one liter IV NS and IV decadron at [facility] on 07-30-13, even able to walk in house and briefly outdoors. He is now on decadron 4 mg tid and clonidine/ lisinopril held (except wife gave lisinopril this AM for BP ~"125/100" supine). I have recommended that she check BP after he has been sitting on SOB for ~ 10 min rather than supine, as he may have orthostatic changes and likely does not need antihypertensives.   PO intake is still poor, tho he can swallow meds and ate [place] food last pm, does better with thick liquids/ applesauce/ pudding. They prefer not to have swallowing study now and will continue to try more of this consistency po; I have requested [facility] dietician coordinate visit here or speak with wife by phone for other suggestions. Family encourages him to eat whenever he is awake. Bowels have not moved in 2 days, so that hemorrhoids are not bothering him now. I have recommended they use the proctofoam HC regularly for now even so. Diplopia seems stable; we have discussed patching one eye. Neuro status otherwise seems stable. Only pain is mild discomfort in bilateral thighs, without LE swelling. Note hx DVT LLE with PE 2012, now off coumadin initially due to planned IT MTX and now with thrombocytopenia.  Family is available to help thru Jan 3; wife has request in for home aide after that.  Wife brought copies of HCPOA (wife, Rosaria Ferries) and Advance Directives, which will be scanned into this EMR.  ONCOLOGIC HISTORY GBM diagnosed Oct 2-11 with craniotomy and resection by Dr Sherwood Gambler with Gliadel wafers, then whole  brain RT and temodar. He had progression by scan Jan 2012, avastin + temodar started then.   MRI (by neurosurgery and not in this EMR-?) in early Dec 2014 was suspicious for recurrence but not entirely typical for this, including brainstem involvement. He had extensive evaluation including large volume LP, with infectious etiology ruled out. He was started on carboplatin with VP16 plus avastin on 12-18 and 07-23-13 with neulasta on 07-24-13. He required IVF with visit 07-30-13, platelets then 61k and IT MTX planned 08-02-13 held due to the thrombocytopenia.   Review of systems as above, also: No sore throat. SOB with walking short distances, not at rest. No swelling LE. No frank nausea. No bleeding. No problems with PAC. Mild thigh discomfort not new. He is sleeping quite a bit, which interferes with taking po's. Remainder of 10 point Review of Systems negative.  Objective:  Vital signs in last 24 hours:  BP 95/65  Pulse 77  Temp(Src) 97.8 F (36.6 C) (Axillary)  Resp 16  Wt 139 lb 6 oz (63.22 kg) Weight is down 1 lb from 07-30-13 despite IVF that day; note he has lost >20 lbs since June 2014.  Looks more comfortable overall today, seated in WC. Mostly whispers and closes eyes frequently, but cooperates with exam and speech mostly fluent/ appropriate.  Wife also looks more rested, she and son very supportive.    HEENT:PERRL, sclerae not icteric. Oral mucosa moist without lesions, no thrush, posterior pharynx clear.  Neck supple. No JVD.  Lymphatics:no cervical,suraclavicular adenopathy Resp: clear to auscultation bilaterally and normal percussion bilaterally Cardio: regular rate and rhythm. No gallop. GI: soft, nontender, not distended, no mass or organomegaly on exam just in WC. Few bowel sounds.  Musculoskeletal/ Extremities: without pitting edema, cords, tenderness Neuro: speech mostly fluent. Moves all extremities in WC. Skin without rash, ecchymosis, petechiae Portacath-without  erythema or tenderness  Lab Results:  Results for orders placed in visit on 08/03/13  CBC WITH DIFFERENTIAL      Result Value Range   WBC 10.0  4.0 - 10.3 10e3/uL   NEUT# 8.3 (*) 1.5 - 6.5 10e3/uL   HGB 15.6  13.0 - 17.1 g/dL   HCT 43.7  38.4 - 49.9 %   Platelets 58 (*) 140 - 400 10e3/uL   MCV 95.0  79.3 - 98.0 fL   MCH 33.9 (*) 27.2 - 33.4 pg   MCHC 35.7  32.0 - 36.0 g/dL   RBC 4.60  4.20 - 5.82 10e6/uL   RDW 12.4  11.0 - 14.6 %   lymph# 0.9  0.9 - 3.3 10e3/uL   MONO# 0.8  0.1 - 0.9 10e3/uL   Eosinophils Absolute 0.0  0.0 - 0.5 10e3/uL   Basophils Absolute 0.0  0.0 - 0.1 10e3/uL   NEUT% 82.9 (*) 39.0 - 75.0 %   LYMPH% 8.7 (*) 14.0 - 49.0 %   MONO% 8.2  0.0 - 14.0 %   EOS% 0.1  0.0 - 7.0 %   BASO% 0.1  0.0 - 2.0 %  COMPREHENSIVE METABOLIC PANEL (JJ94)      Result Value Range   Sodium 136  136 - 145 mEq/L   Potassium 4.0  3.5 - 5.1 mEq/L   Chloride 97 (*) 98 - 109 mEq/L   CO2 25  22 - 29 mEq/L   Glucose 128  70 - 140 mg/dl   BUN 34.1 (*) 7.0 - 26.0 mg/dL   Creatinine 1.1  0.7 - 1.3 mg/dL   Total Bilirubin 0.87  0.20 - 1.20 mg/dL   Alkaline Phosphatase 149  40 - 150 U/L   AST 22  5 - 34 U/L   ALT 105 (*) 0 - 55 U/L   Total Protein 6.8  6.4 - 8.3 g/dL   Albumin 3.8  3.5 - 5.0 g/dL   Calcium 10.1  8.4 - 10.4 mg/dL   Anion Gap 14 (*) 3 - 11 mEq/L    As platelets seem to have leveled out today, hopefully those will soon begin to recover from recent chemo.  Studies/Results:  No results found.  Medications: I have reviewed the patient's current medications. Hold clonidine and lisinopril unless BP >=140/90 checked with patient seated. Begin proctofoam HC. Continue decadron 4 mg tid with food and call if any concern re thrush.  IT MTX will need to be rescheduled by IR if appropriate after counts recover.   DISCUSSION: even with lower BP today, patient looks somewhat brighter and better overall, and wife seems to be coping better also. Discussion re meds, assistance at home  and po intake as above. They understand rationale for holding IT MTX and coumadin for now. With active chemo, I did not discuss Hospice, tho APP broached that subject last week. Advance Directives noted. We will leave appointment as scheduled with APP on 08-06-12 if needed, but if he does not need to be seen they can let us know to cancel that; he will see Dr Humphrey Rolls with planned treatment on 08-11-12.  Assessment/Plan: 1.progressive glioblastoma multiforme: history as above, recent progression, chemotherapy with carbo VP16 begun 07-22-13. Will give 1 liter NS again today 2.PAC in 3.hx LLE DVT with PE in 2012: off coumadin for now due to low platelets (had been held also for planned IT MTX). Obviously he is at risk for recurrent clot with GBM and this history. 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 Consider swallowing study (wife does not want now -- ? If they might consider at [facility]) and have [place] assist. Will give IVF again today (one liter NS). We briefly mentioned options for IVF at home/ at [facility]/ at [facility]  6.patient tells me that he does not take flu vaccine 7.hx HTN with BP just 95/65 seated now (post lisinopril this AM). Likely does not need antihypertensives given weight loss and tendency to dehydration now. IVF today.  Wife, son and patient have had all questions answered to their satisfaction and are in agreement with plan above. (per other MD notes, patient was previously Data processing manager for [employer]).     LIVESAY,LENNIS P, MD   08/03/2013, 1:41 PM

## 2013-08-03 NOTE — Patient Instructions (Signed)
Dehydration, Adult Dehydration is when you lose more fluids from the body than you take in. Vital organs like the kidneys, brain, and heart cannot function without a proper amount of fluids and salt. Any loss of fluids from the body can cause dehydration.  CAUSES   Vomiting.  Diarrhea.  Excessive sweating.  Excessive urine output.  Fever. SYMPTOMS  Mild dehydration  Thirst.  Dry lips.  Slightly dry mouth. Moderate dehydration  Very dry mouth.  Sunken eyes.  Skin does not bounce back quickly when lightly pinched and released.  Dark urine and decreased urine production.  Decreased tear production.  Headache. Severe dehydration  Very dry mouth.  Extreme thirst.  Rapid, weak pulse (more than 100 beats per minute at rest).  Cold hands and feet.  Not able to sweat in spite of heat and temperature.  Rapid breathing.  Blue lips.  Confusion and lethargy.  Difficulty being awakened.  Minimal urine production.  No tears. DIAGNOSIS  Your caregiver will diagnose dehydration based on your symptoms and your exam. Blood and urine tests will help confirm the diagnosis. The diagnostic evaluation should also identify the cause of dehydration. TREATMENT  Treatment of mild or moderate dehydration can often be done at home by increasing the amount of fluids that you drink. It is best to drink small amounts of fluid more often. Drinking too much at one time can make vomiting worse. Refer to the home care instructions below. Severe dehydration needs to be treated at the hospital where you will probably be given intravenous (IV) fluids that contain water and electrolytes. HOME CARE INSTRUCTIONS   Ask your caregiver about specific rehydration instructions.  Drink enough fluids to keep your urine clear or pale yellow.  Drink small amounts frequently if you have nausea and vomiting.  Eat as you normally do.  Avoid:  Foods or drinks high in sugar.  Carbonated  drinks.  Juice.  Extremely hot or cold fluids.  Drinks with caffeine.  Fatty, greasy foods.  Alcohol.  Tobacco.  Overeating.  Gelatin desserts.  Wash your hands well to avoid spreading bacteria and viruses.  Only take over-the-counter or prescription medicines for pain, discomfort, or fever as directed by your caregiver.  Ask your caregiver if you should continue all prescribed and over-the-counter medicines.  Keep all follow-up appointments with your caregiver. SEEK MEDICAL CARE IF:  You have abdominal pain and it increases or stays in one area (localizes).  You have a rash, stiff neck, or severe headache.  You are irritable, sleepy, or difficult to awaken.  You are weak, dizzy, or extremely thirsty. SEEK IMMEDIATE MEDICAL CARE IF:   You are unable to keep fluids down or you get worse despite treatment.  You have frequent episodes of vomiting or diarrhea.  You have blood or green matter (bile) in your vomit.  You have blood in your stool or your stool looks black and tarry.  You have not urinated in 6 to 8 hours, or you have only urinated a small amount of very dark urine.  You have a fever.  You faint. MAKE SURE YOU:   Understand these instructions.  Will watch your condition.  Will get help right away if you are not doing well or get worse. Document Released: 07/22/2005 Document Revised: 10/14/2011 Document Reviewed: 03/11/2011 [employer] Patient Information 2014 [employer].  

## 2013-08-03 NOTE — Patient Instructions (Signed)
Check blood pressure after he has been sitting up for >=10 min. Do not give clonidine or lisinopril if systolic top # is <140 or diastolic bottom # is <90  Use proctofoam HC for hemorrhoids twice daily for now  Call if any thrush since he is on the decadron  He needs to have some food in stomach with each dose of the steroids.  If he is doing ok and does not need to be seen on Friday 1-2, fine to call and let us know that; we will leave the appointment available in case he does need anything. Keep appointment with Dr Welton Flakes on 1-7 as scheduled.

## 2013-08-04 ENCOUNTER — Encounter: Payer: Self-pay | Admitting: Radiation Oncology

## 2013-08-04 ENCOUNTER — Telehealth: Payer: Self-pay | Admitting: Oncology

## 2013-08-04 NOTE — Telephone Encounter (Signed)
, °

## 2013-08-04 NOTE — Progress Notes (Signed)
  Radiation Oncology         [phone] ________________________________  Name: YEHYA BRENDLE MRN: [id number]  Date: 08/04/2013  DOB: 1951-02-13  Chart Note:  On behalf of this patient's treating physicans Welton Flakes, [place], and [place]), I presented his case to the [facility] brain conference this morning via webinar.  They were able to see his MRI's which had been submitted last week.  I shared his clinical history and we discussed possible further diagnostic and therapeutic options.  They agreed that meningeal biopsy was not recommended.  They agreed that salvage chemo is warranted with ongoing MRI's at short intervals to evaluate efficacy.  They agreed that intrathecal chemotherapy was not advised given the risks and lack of evidence of efficacy.  They agreed that craniospinal irradiation would not be advised.  They recommended that we consider comprehensive imaging of the spinal axis to evaluate for leptomeningeal deposits along the spinal cord/nerve roots.  They recommended close evaluation and monitoring of cranial nerve function, reserving radiotherapy for focal palliation of cranial neuropathies or other focal neuro deficits.  ________________________________  Artist Pais Kathrynn Running, M.D.

## 2013-08-06 ENCOUNTER — Telehealth: Payer: Self-pay | Admitting: Oncology

## 2013-08-06 ENCOUNTER — Ambulatory Visit (HOSPITAL_BASED_OUTPATIENT_CLINIC_OR_DEPARTMENT_OTHER): Payer: 59 | Admitting: Adult Health

## 2013-08-06 ENCOUNTER — Encounter: Payer: Self-pay | Admitting: Adult Health

## 2013-08-06 ENCOUNTER — Telehealth: Payer: Self-pay | Admitting: *Deleted

## 2013-08-06 ENCOUNTER — Other Ambulatory Visit (HOSPITAL_BASED_OUTPATIENT_CLINIC_OR_DEPARTMENT_OTHER): Payer: 59

## 2013-08-06 VITALS — BP 106/75 | HR 127 | Temp 97.3°F | Resp 19 | Ht 71.0 in | Wt 135.2 lb

## 2013-08-06 DIAGNOSIS — R634 Abnormal weight loss: Secondary | ICD-10-CM

## 2013-08-06 DIAGNOSIS — C711 Malignant neoplasm of frontal lobe: Secondary | ICD-10-CM

## 2013-08-06 DIAGNOSIS — E860 Dehydration: Secondary | ICD-10-CM

## 2013-08-06 DIAGNOSIS — H532 Diplopia: Secondary | ICD-10-CM

## 2013-08-06 LAB — COMPREHENSIVE METABOLIC PANEL (CC13)
ALBUMIN: 3.7 g/dL (ref 3.5–5.0)
ALK PHOS: 152 U/L — AB (ref 40–150)
ALT: 114 U/L — ABNORMAL HIGH (ref 0–55)
AST: 35 U/L — ABNORMAL HIGH (ref 5–34)
Anion Gap: 14 mEq/L — ABNORMAL HIGH (ref 3–11)
BILIRUBIN TOTAL: 0.72 mg/dL (ref 0.20–1.20)
BUN: 38.5 mg/dL — AB (ref 7.0–26.0)
CO2: 25 mEq/L (ref 22–29)
Calcium: 10 mg/dL (ref 8.4–10.4)
Chloride: 99 mEq/L (ref 98–109)
Creatinine: 1.2 mg/dL (ref 0.7–1.3)
Glucose: 140 mg/dl (ref 70–140)
Potassium: 4.2 mEq/L (ref 3.5–5.1)
Sodium: 138 mEq/L (ref 136–145)
Total Protein: 6.6 g/dL (ref 6.4–8.3)

## 2013-08-06 LAB — CBC WITH DIFFERENTIAL/PLATELET
BASO%: 0.6 % (ref 0.0–2.0)
BASOS ABS: 0.1 10*3/uL (ref 0.0–0.1)
EOS ABS: 0 10*3/uL (ref 0.0–0.5)
EOS%: 0.2 % (ref 0.0–7.0)
HCT: 47.3 % (ref 38.4–49.9)
HEMOGLOBIN: 16 g/dL (ref 13.0–17.1)
LYMPH%: 13.9 % — AB (ref 14.0–49.0)
MCH: 33.9 pg — AB (ref 27.2–33.4)
MCHC: 33.7 g/dL (ref 32.0–36.0)
MCV: 100.5 fL — AB (ref 79.3–98.0)
MONO#: 0.5 10*3/uL (ref 0.1–0.9)
MONO%: 4.3 % (ref 0.0–14.0)
NEUT%: 81 % — ABNORMAL HIGH (ref 39.0–75.0)
NEUTROS ABS: 9.6 10*3/uL — AB (ref 1.5–6.5)
Platelets: 82 10*3/uL — ABNORMAL LOW (ref 140–400)
RBC: 4.7 10*6/uL (ref 4.20–5.82)
RDW: 13.2 % (ref 11.0–14.6)
WBC: 11.9 10*3/uL — ABNORMAL HIGH (ref 4.0–10.3)
lymph#: 1.6 10*3/uL (ref 0.9–3.3)

## 2013-08-06 MED ORDER — ARTIFICIAL TEARS OP OINT: TOPICAL_OINTMENT | Freq: Every evening | OPHTHALMIC | Status: AC | PRN

## 2013-08-06 MED ORDER — SODIUM CHLORIDE 0.9 % IV SOLN
Freq: Once | INTRAVENOUS | Status: AC
  Administered 2013-08-06: 12:00:00 via INTRAVENOUS

## 2013-08-06 NOTE — Telephone Encounter (Signed)
, °

## 2013-08-06 NOTE — Telephone Encounter (Signed)
Per staff message and POF I have scheduled appts.  JMW  

## 2013-08-06 NOTE — Progress Notes (Signed)
OFFICE PROGRESS NOTE  Dr. 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  PRIOR THERAPY:  #1 patient underwent a resection of the brain tumor followed by concurrent chemotherapy and radiation therapy. The tumor chemotherapy consisted of Temodar 75 mg per meter squared.  #2 patient was then started on Temodar 150mg /meter squared given days 1 through 5 every 28 days with concomitant Avastin given every 2 weeks.  His last cycle of temodar was on 05/24/13.   #3 patient also developed lower extremity DVT as well as as pulmonary embolism he was started on Coumadin to try to maintain an INR between 2 and 2.5.  (Coumadin currently on hold).    #4  Patient underwent MRI and evaluation with Dr. Sherwood Gambler in November, 2014.  MRI was concerning for GBM recurrence, but appeared atypical.  He underwent two lumbar punctures, an infectious disease consult, and the areas of enhancement on MRI were attributed to the GBM.    CURRENT THERAPY: Palliative chemotherapy consisting of Avastin/carboplatinum/etoposide days one through 3 last given on 07/22/13  INTERVAL HISTORY: Roger Raymond 63 y.o. male returns for followup visit today. He received Avastin/Carboplatinum/Etoposide two weeks ago.  He continues to have weakness.  He was started on Dexamethasone 4mg  TID on 12/26 and has noticed no improvement in weakness or diplopia.  His wife does notice that he stays up for longer during the day.  He is improving with taking his pills with apple sauce.  He is eating and drinking thicker fluids and doing well with this.  He has not taken any more Clonidine or Lisinopril.  His family notices that he seems improved after receiving IV fluids.  His wife did notice that his left eye doesn't shut all of the way when he closes it.  This has been going on  for a few days.  He hasn't had any fevers, chills, nausea, vomiting, constipation, diarrhea, numbness, or any other concerns.     MEDICAL HISTORY: Past Medical History  Diagnosis Date  . DVT (deep venous thrombosis) 07/08/2011  . 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  MEDICATIONS:  Current Outpatient Prescriptions  Medication Sig Dispense Refill  . acetaminophen (TYLENOL) 325 MG tablet Take 650 mg by mouth every 6 (six) hours as needed for mild pain or moderate pain.       . chlorproMAZINE (THORAZINE) 10 MG tablet Take 10 mg by mouth 3 (three) times daily as needed for hiccoughs.      [name] dexamethasone (DECADRON) 4 MG tablet Take 1 tablet (4 mg total) by mouth 3 (three) times daily.  90 tablet  2  . hydrocortisone-pramoxine (PROCTOFOAM HC) rectal foam Place 1 applicator rectally 2 (two) times daily.  10 g  0  . levETIRAcetam (KEPPRA) 500 MG tablet Take 500 mg by mouth 2 (two) times daily.       [name] lidocaine-prilocaine (EMLA) cream Apply 1 application topically as needed. Apply to port 1-2 hours before procedure      . LORazepam (ATIVAN) 0.5 MG tablet Take 0.5 mg by mouth every 6 (six) hours as needed (Nausea or vomiting).      . ondansetron (ZOFRAN) 8 MG tablet Take 16 mg by mouth 2 (two) times daily. Take two times a day starting the day after chemo for 3 days.  Then take two times a day as needed for nausea or vomiting.      . pantoprazole (PROTONIX) 40 MG tablet Take 40 mg by mouth daily as needed (heartburn).      . prochlorperazine (COMPAZINE) 10 MG tablet Take 10 mg by mouth every 6 (six) hours as needed (Nausea or vomiting).      [name] artificial tears (LACRILUBE) OINT ophthalmic ointment Place into the left eye at bedtime as needed for dry eyes.  3.5 g  2  . diphenoxylate-atropine (LOMOTIL) 2.5-0.025 MG per tablet Take 1-2 tablets by mouth 4 (four) times daily as needed for diarrhea or loose stools. 1-2 tabs prn      . ondansetron (ZOFRAN) 8 MG  tablet Take 8 mg by mouth every 12 (twelve) hours as needed for nausea.      . sodium chloride (OCEAN) 0.65 % nasal spray Place 1 spray into the nose as needed for congestion.        No current facility-administered medications for this visit.    SURGICAL HISTORY:  Past Surgical History  Procedure Laterality Date  . Vasectomy    . Brain surgery      REVIEW OF SYSTEMS:  A 10 point review of systems was conducted and is otherwise negative except for what is noted above.    PHYSICAL EXAMINATION:  BP 106/75  Pulse 127  Temp(Src) 97.3 F (36.3 C) (Oral)  Resp 19  Ht 5\' 11"  (1.803 m)  Wt 135 lb 3 oz (61.321 kg)  BMI 18.86 kg/m2 GENERAL: In wheelchair, cachectic, ill appearing HEENT:  PERRL.  Sclerae anicteric. Left eye protrusion, doesn't close completely on exam  Oropharynx clear and moist. No ulcerations or evidence of oropharyngeal candidiasis. Neck is supple.  NODES:  No cervical, supraclavicular, or axillary lymphadenopathy palpated.  LUNGS:  Clear to auscultation bilaterally.  No wheezes or rhonchi. HEART:  Regular rate and rhythm. No murmur appreciated. ABDOMEN:  Soft, nontender.  Positive, normoactive bowel sounds. No organomegaly palpated.--LIMITED EXAM, PATIENT UNABLE TO GET OUT OF WHEELCHAIR MSK:  No focal spinal tenderness to palpation. Full range of motion bilaterally in the upper extremities. EXTREMITIES:  No peripheral edema.   SKIN:  Clear with no obvious rashes or skin changes. No nail dyscrasia. NEURO: speech is slow, but fluent, moves all extremities.   ECOG PERFORMANCE STATUS: 0 - Asymptomatic    LABORATORY DATA: Lab Results  Component Value Date   WBC 11.9* 08/06/2013   HGB 16.0 08/06/2013   HCT 47.3 08/06/2013   MCV 100.5* 08/06/2013   PLT 82* 08/06/2013      Chemistry      Component Value Date/Time   NA 138 08/06/2013 0959   NA 138 03/02/2012 1100   K 4.2 08/06/2013 0959   K 4.5 03/02/2012 1100   CL 109* 01/19/2013 1102   CL 102 03/02/2012 1100   CO2 25  08/06/2013 0959   CO2 23 03/02/2012 1100   BUN 38.5* 08/06/2013 0959   BUN 25* 03/02/2012 1100   CREATININE 1.2 08/06/2013 0959   CREATININE 1.05 03/02/2012 1100      Component Value Date/Time   CALCIUM 10.0 08/06/2013 0959   CALCIUM 9.5 03/02/2012 1100   ALKPHOS 152* 08/06/2013 0959   ALKPHOS 130* 03/02/2012 1100   AST 35* 08/06/2013 0959   AST 39* 03/02/2012 1100   ALT 114* 08/06/2013 0959   ALT 65* 03/02/2012 1100   BILITOT 0.72 08/06/2013 0959   BILITOT 0.4 03/02/2012 1100  RADIOGRAPHIC STUDIES:  No results found.  ASSESSMENT: 63 year old gentleman with:  1.  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 Patient's overall clinical status is deteriorating. He has had a full workup for infectious etiology which is unrevealing. 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 Was recommended patient proceed with chemotherapy. We also discussed referring him to a tertiary care center such as [facility] or [facility]. At this time patient has declined referral I would like to be treated here.  #2 He was recommended starting him on initially with Avastin/carboplatinum/etoposide days one through 3. He will receive first cycle today. Avastin and carboplatinum will be given every 3 weeks.  This therapy began on 07/22/13.  #3 It was originally planned for the patient to undergo IT methotrexate on day 15 of treatment, however, due to his thrombocytopenia we will hold this.  Chemo related thrombocytopenia.  #4 risks benefits and side effects of treatment were discussed with the patient as well as his family. They understand this is all palliative care. They have demonstrated understanding of the goals of therapy.   PLAN:   #1 Roger Raymond appears stable since his last visit.  His right eye protrusion is bothersome.  I prescribed Lacrilube ointment for him to use at night.  He will continue Dexamethasone 4mg  TID.    #2 I reviewed his CBC with his wife, his thrombocytopenia is slightly  improved.  We will continue to hold his Coumadin.    #3  He has continued to lose weight.  He will receive 1L NS today for his dehydration.  I have also requested an appointment for IV fluids be scheduled on Monday, 08/09/13 as well.      #3 Since his diplopia is worse, he will receive Dexamethasone 10mg  IV in the treatment room, he will then start on Dexamethasone 4mg  PO TID.    #4 He will return on Monday 08/09/13 for IV fluids, and on Wednesday 08/11/13 for labs, and an appointment with Dr. Humphrey Rolls.    All questions were answered. The patient knows to call the clinic with any problems, questions or concerns. We can certainly see the patient much sooner if necessary.  I spent 40 minutes counseling the patient face to face. The total time spent in the appointment was 60 minutes.   Minette Headland, [place] [phone] 08/06/2013, 1:18 PM

## 2013-08-09 ENCOUNTER — Ambulatory Visit (HOSPITAL_BASED_OUTPATIENT_CLINIC_OR_DEPARTMENT_OTHER): Payer: 59

## 2013-08-09 VITALS — BP 136/98 | HR 115 | Temp 97.3°F | Resp 16

## 2013-08-09 DIAGNOSIS — E860 Dehydration: Secondary | ICD-10-CM

## 2013-08-09 DIAGNOSIS — C711 Malignant neoplasm of frontal lobe: Secondary | ICD-10-CM

## 2013-08-09 MED ORDER — SODIUM CHLORIDE 0.9 % IV SOLN
INTRAVENOUS | Status: DC
  Administered 2013-08-09: 14:00:00 via INTRAVENOUS

## 2013-08-09 MED ORDER — SODIUM CHLORIDE 0.9 % IJ SOLN
10.0000 mL | Freq: Once | INTRAMUSCULAR | Status: AC
  Administered 2013-08-09: 10 mL via INTRAVENOUS
  Filled 2013-08-09: qty 10

## 2013-08-09 MED ORDER — HEPARIN SOD (PORK) LOCK FLUSH 100 UNIT/ML IV SOLN
500.0000 [IU] | Freq: Once | INTRAVENOUS | Status: AC
  Administered 2013-08-09: 500 [IU] via INTRAVENOUS
  Filled 2013-08-09: qty 5

## 2013-08-09 NOTE — Patient Instructions (Signed)
Dehydration, Adult Dehydration is when you lose more fluids from the body than you take in. Vital organs like the kidneys, brain, and heart cannot function without a proper amount of fluids and salt. Any loss of fluids from the body can cause dehydration.  CAUSES   Vomiting.  Diarrhea.  Excessive sweating.  Excessive urine output.  Fever. SYMPTOMS  Mild dehydration  Thirst.  Dry lips.  Slightly dry mouth. Moderate dehydration  Very dry mouth.  Sunken eyes.  Skin does not bounce back quickly when lightly pinched and released.  Dark urine and decreased urine production.  Decreased tear production.  Headache. Severe dehydration  Very dry mouth.  Extreme thirst.  Rapid, weak pulse (more than 100 beats per minute at rest).  Cold hands and feet.  Not able to sweat in spite of heat and temperature.  Rapid breathing.  Blue lips.  Confusion and lethargy.  Difficulty being awakened.  Minimal urine production.  No tears. DIAGNOSIS  Your caregiver will diagnose dehydration based on your symptoms and your exam. Blood and urine tests will help confirm the diagnosis. The diagnostic evaluation should also identify the cause of dehydration. TREATMENT  Treatment of mild or moderate dehydration can often be done at home by increasing the amount of fluids that you drink. It is best to drink small amounts of fluid more often. Drinking too much at one time can make vomiting worse. Refer to the home care instructions below. Severe dehydration needs to be treated at the hospital where you will probably be given intravenous (IV) fluids that contain water and electrolytes. HOME CARE INSTRUCTIONS   Ask your caregiver about specific rehydration instructions.  Drink enough fluids to keep your urine clear or pale yellow.  Drink small amounts frequently if you have nausea and vomiting.  Eat as you normally do.  Avoid:  Foods or drinks high in sugar.  Carbonated  drinks.  Juice.  Extremely hot or cold fluids.  Drinks with caffeine.  Fatty, greasy foods.  Alcohol.  Tobacco.  Overeating.  Gelatin desserts.  Wash your hands well to avoid spreading bacteria and viruses.  Only take over-the-counter or prescription medicines for pain, discomfort, or fever as directed by your caregiver.  Ask your caregiver if you should continue all prescribed and over-the-counter medicines.  Keep all follow-up appointments with your caregiver. SEEK MEDICAL CARE IF:  You have abdominal pain and it increases or stays in one area (localizes).  You have a rash, stiff neck, or severe headache.  You are irritable, sleepy, or difficult to awaken.  You are weak, dizzy, or extremely thirsty. SEEK IMMEDIATE MEDICAL CARE IF:   You are unable to keep fluids down or you get worse despite treatment.  You have frequent episodes of vomiting or diarrhea.  You have blood or green matter (bile) in your vomit.  You have blood in your stool or your stool looks black and tarry.  You have not urinated in 6 to 8 hours, or you have only urinated a small amount of very dark urine.  You have a fever.  You faint. MAKE SURE YOU:   Understand these instructions.  Will watch your condition.  Will get help right away if you are not doing well or get worse. Document Released: 07/22/2005 Document Revised: 10/14/2011 Document Reviewed: 03/11/2011 [employer] Patient Information 2014 [employer].  

## 2013-08-10 LAB — FUNGUS CULTURE W SMEAR: Smear Result: NONE SEEN

## 2013-08-11 ENCOUNTER — Ambulatory Visit (HOSPITAL_BASED_OUTPATIENT_CLINIC_OR_DEPARTMENT_OTHER): Payer: 59 | Admitting: Oncology

## 2013-08-11 ENCOUNTER — Other Ambulatory Visit: Payer: 59

## 2013-08-11 ENCOUNTER — Ambulatory Visit: Payer: 59

## 2013-08-11 ENCOUNTER — Encounter: Payer: Self-pay | Admitting: Oncology

## 2013-08-11 ENCOUNTER — Encounter: Payer: Self-pay | Admitting: Nutrition

## 2013-08-11 ENCOUNTER — Encounter: Payer: 59 | Admitting: Nutrition

## 2013-08-11 DIAGNOSIS — C711 Malignant neoplasm of frontal lobe: Secondary | ICD-10-CM

## 2013-08-11 NOTE — Progress Notes (Signed)
Diagnosis: Metastatic recurrence glioblastoma multiforme  Roger Raymond was seen today with the accompaniment of his family including Mrs. Scharf and their son. Patient basically was in a wheelchair and was noncommunicative. Clinically he has deteriorated quite a bit. This is all concerning for progressive disease.  We had extensive discussion regarding goals of care. Patient is very appropriate for hospice at this time. Family would like for patient to be enrolled in hospice. We will initially plan on having him enrolled in home hospice. However we would like to pursue inpatient hospice fairly quickly. Patient's overall prognosis is very poor I do think he only has weeks to live. Family demonstrates understanding. I have asked my nurse to speak with hospice of [facility] immediately to get services in place.  Patient's family was very appreciative as was the patient of all the care that he has received at [facility].  Marcy Panning, MD Medical/Oncology [facility] [phone] (beeper) [phone] (Office)  08/11/2013, 10:46 AM

## 2013-08-11 NOTE — Progress Notes (Signed)
Patient's chemotherapy was cancelled therefore, nutrition appointment which had been scheduled during chemotherapy was also cancelled.

## 2013-08-12 ENCOUNTER — Other Ambulatory Visit: Payer: Self-pay | Admitting: Oncology

## 2013-08-12 ENCOUNTER — Ambulatory Visit: Payer: 59

## 2013-08-13 ENCOUNTER — Telehealth: Payer: Self-pay | Admitting: *Deleted

## 2013-08-13 ENCOUNTER — Ambulatory Visit: Payer: 59

## 2013-08-13 NOTE — Telephone Encounter (Signed)
Per staff message and POF I have scheduled appts.  JMW  

## 2013-08-14 ENCOUNTER — Ambulatory Visit: Payer: 59

## 2013-08-20 ENCOUNTER — Telehealth: Payer: Self-pay

## 2013-08-20 NOTE — Telephone Encounter (Signed)
Patient past away @ Home per [facility] in [place] [employer] °

## 2013-08-25 ENCOUNTER — Ambulatory Visit: Payer: 59

## 2013-08-26 LAB — AFB CULTURE WITH SMEAR (NOT AT ARMC): Acid Fast Smear: NONE SEEN

## 2013-09-01 ENCOUNTER — Ambulatory Visit: Payer: 59

## 2013-09-02 ENCOUNTER — Ambulatory Visit: Payer: 59

## 2013-09-03 ENCOUNTER — Ambulatory Visit: Payer: 59

## 2013-09-04 ENCOUNTER — Ambulatory Visit: Payer: 59

## 2013-09-05 DEATH — deceased
